# Patient Record
Sex: Female | Born: 1945
Health system: Southern US, Community
[De-identification: ages and names within clinical notes are randomized; demographics above are authoritative.]

## PROBLEM LIST (undated history)

## (undated) DIAGNOSIS — J189 Pneumonia, unspecified organism: Secondary | ICD-10-CM

## (undated) DIAGNOSIS — M419 Scoliosis, unspecified: Secondary | ICD-10-CM

## (undated) DIAGNOSIS — D649 Anemia, unspecified: Secondary | ICD-10-CM

## (undated) DIAGNOSIS — Z923 Personal history of irradiation: Secondary | ICD-10-CM

## (undated) DIAGNOSIS — Z9221 Personal history of antineoplastic chemotherapy: Secondary | ICD-10-CM

## (undated) DIAGNOSIS — I10 Essential (primary) hypertension: Secondary | ICD-10-CM

## (undated) DIAGNOSIS — R300 Dysuria: Secondary | ICD-10-CM

## (undated) DIAGNOSIS — C50919 Malignant neoplasm of unspecified site of unspecified female breast: Secondary | ICD-10-CM

## (undated) DIAGNOSIS — H269 Unspecified cataract: Secondary | ICD-10-CM

## (undated) DIAGNOSIS — J449 Chronic obstructive pulmonary disease, unspecified: Secondary | ICD-10-CM

## (undated) DIAGNOSIS — G5790 Unspecified mononeuropathy of unspecified lower limb: Secondary | ICD-10-CM

## (undated) DIAGNOSIS — R05 Cough: Secondary | ICD-10-CM

## (undated) DIAGNOSIS — I509 Heart failure, unspecified: Secondary | ICD-10-CM

## (undated) DIAGNOSIS — R634 Abnormal weight loss: Secondary | ICD-10-CM

## (undated) DIAGNOSIS — I4892 Unspecified atrial flutter: Secondary | ICD-10-CM

## (undated) DIAGNOSIS — R911 Solitary pulmonary nodule: Secondary | ICD-10-CM

## (undated) DIAGNOSIS — M8580 Other specified disorders of bone density and structure, unspecified site: Secondary | ICD-10-CM

## (undated) HISTORY — DX: Malignant neoplasm of unspecified site of unspecified female breast: C50.919

## (undated) HISTORY — DX: Anemia, unspecified: D64.9

## (undated) HISTORY — DX: Chronic obstructive pulmonary disease, unspecified: J44.9

## (undated) HISTORY — DX: Abnormal weight loss: R63.4

## (undated) HISTORY — DX: Cough: R05

## (undated) HISTORY — DX: Unspecified cataract: H26.9

## (undated) HISTORY — DX: Other specified disorders of bone density and structure, unspecified site: M85.80

## (undated) HISTORY — DX: Pneumonia, unspecified organism: J18.9

## (undated) HISTORY — DX: Heart failure, unspecified: I50.9

## (undated) HISTORY — DX: Unspecified atrial flutter: I48.92

## (undated) HISTORY — DX: Dysuria: R30.0

## (undated) HISTORY — DX: Solitary pulmonary nodule: R91.1

---

## 2009-08-13 DIAGNOSIS — I4892 Unspecified atrial flutter: Secondary | ICD-10-CM

## 2009-08-13 DIAGNOSIS — I509 Heart failure, unspecified: Secondary | ICD-10-CM

## 2009-08-13 DIAGNOSIS — J189 Pneumonia, unspecified organism: Secondary | ICD-10-CM

## 2009-08-13 HISTORY — DX: Pneumonia, unspecified organism: J18.9

## 2009-08-13 HISTORY — DX: Unspecified atrial flutter: I48.92

## 2009-08-13 HISTORY — DX: Heart failure, unspecified: I50.9

## 2010-03-06 ENCOUNTER — Encounter: Admission: RE | Admit: 2010-03-06 | Discharge: 2010-03-06 | Payer: Self-pay | Admitting: Family Medicine

## 2010-03-08 ENCOUNTER — Encounter: Admission: RE | Admit: 2010-03-08 | Discharge: 2010-03-08 | Payer: Self-pay | Admitting: Family Medicine

## 2010-03-14 ENCOUNTER — Encounter: Admission: RE | Admit: 2010-03-14 | Discharge: 2010-03-14 | Payer: Self-pay | Admitting: Family Medicine

## 2010-03-15 ENCOUNTER — Ambulatory Visit: Payer: Self-pay | Admitting: Oncology

## 2010-03-21 ENCOUNTER — Encounter: Admission: RE | Admit: 2010-03-21 | Discharge: 2010-03-21 | Payer: Self-pay | Admitting: Family Medicine

## 2010-03-22 LAB — CBC WITH DIFFERENTIAL/PLATELET
BASO%: 0.7 % (ref 0.0–2.0)
EOS%: 1.6 % (ref 0.0–7.0)
Eosinophils Absolute: 0.1 10*3/uL (ref 0.0–0.5)
MCH: 31.4 pg (ref 25.1–34.0)
MCHC: 34 g/dL (ref 31.5–36.0)
MCV: 92.4 fL (ref 79.5–101.0)
MONO%: 9.3 % (ref 0.0–14.0)
NEUT#: 4.2 10*3/uL (ref 1.5–6.5)
RBC: 3.91 10*6/uL (ref 3.70–5.45)
RDW: 13.5 % (ref 11.2–14.5)

## 2010-03-22 LAB — COMPREHENSIVE METABOLIC PANEL
ALT: 10 U/L (ref 0–35)
AST: 18 U/L (ref 0–37)
Albumin: 4 g/dL (ref 3.5–5.2)
Alkaline Phosphatase: 48 U/L (ref 39–117)
Potassium: 4.1 mEq/L (ref 3.5–5.3)
Sodium: 137 mEq/L (ref 135–145)
Total Protein: 7.3 g/dL (ref 6.0–8.3)

## 2010-03-27 ENCOUNTER — Encounter: Admission: RE | Admit: 2010-03-27 | Discharge: 2010-03-27 | Payer: Self-pay | Admitting: Oncology

## 2010-03-28 ENCOUNTER — Telehealth: Payer: Self-pay | Admitting: Pulmonary Disease

## 2010-03-28 ENCOUNTER — Ambulatory Visit (HOSPITAL_COMMUNITY): Admission: RE | Admit: 2010-03-28 | Discharge: 2010-03-28 | Payer: Self-pay | Admitting: Oncology

## 2010-03-29 ENCOUNTER — Ambulatory Visit: Payer: Self-pay | Admitting: Pulmonary Disease

## 2010-03-29 DIAGNOSIS — R599 Enlarged lymph nodes, unspecified: Secondary | ICD-10-CM | POA: Insufficient documentation

## 2010-03-29 DIAGNOSIS — J449 Chronic obstructive pulmonary disease, unspecified: Secondary | ICD-10-CM

## 2010-03-30 DIAGNOSIS — R911 Solitary pulmonary nodule: Secondary | ICD-10-CM

## 2010-04-03 ENCOUNTER — Ambulatory Visit (HOSPITAL_COMMUNITY): Admission: RE | Admit: 2010-04-03 | Discharge: 2010-04-03 | Payer: Self-pay | Admitting: Thoracic Surgery

## 2010-04-03 ENCOUNTER — Ambulatory Visit: Payer: Self-pay | Admitting: Thoracic Surgery

## 2010-04-03 ENCOUNTER — Ambulatory Visit: Payer: Self-pay | Admitting: Pulmonary Disease

## 2010-04-03 HISTORY — PX: BRONCHOSCOPY: SUR163

## 2010-04-13 ENCOUNTER — Encounter: Payer: Self-pay | Admitting: Pulmonary Disease

## 2010-04-13 DIAGNOSIS — C50919 Malignant neoplasm of unspecified site of unspecified female breast: Secondary | ICD-10-CM

## 2010-04-13 HISTORY — DX: Malignant neoplasm of unspecified site of unspecified female breast: C50.919

## 2010-04-20 ENCOUNTER — Encounter (INDEPENDENT_AMBULATORY_CARE_PROVIDER_SITE_OTHER): Payer: Self-pay | Admitting: General Surgery

## 2010-04-20 ENCOUNTER — Ambulatory Visit (HOSPITAL_COMMUNITY): Admission: RE | Admit: 2010-04-20 | Discharge: 2010-04-21 | Payer: Self-pay | Admitting: General Surgery

## 2010-04-20 ENCOUNTER — Encounter: Payer: Self-pay | Admitting: Pulmonary Disease

## 2010-04-20 HISTORY — PX: MASTECTOMY: SHX3

## 2010-05-01 ENCOUNTER — Ambulatory Visit: Payer: Self-pay | Admitting: Pulmonary Disease

## 2010-05-03 ENCOUNTER — Ambulatory Visit: Admission: RE | Admit: 2010-05-03 | Discharge: 2010-05-03 | Payer: Self-pay | Admitting: Oncology

## 2010-05-03 ENCOUNTER — Ambulatory Visit: Payer: Self-pay | Admitting: Cardiology

## 2010-05-10 ENCOUNTER — Ambulatory Visit: Payer: Self-pay | Admitting: Oncology

## 2010-05-12 ENCOUNTER — Encounter: Payer: Self-pay | Admitting: Pulmonary Disease

## 2010-05-12 LAB — COMPREHENSIVE METABOLIC PANEL
ALT: 8 U/L (ref 0–35)
Albumin: 4 g/dL (ref 3.5–5.2)
Alkaline Phosphatase: 53 U/L (ref 39–117)
Glucose, Bld: 85 mg/dL (ref 70–99)
Potassium: 4.4 mEq/L (ref 3.5–5.3)
Sodium: 134 mEq/L — ABNORMAL LOW (ref 135–145)
Total Protein: 6.7 g/dL (ref 6.0–8.3)

## 2010-05-12 LAB — CBC WITH DIFFERENTIAL/PLATELET
Eosinophils Absolute: 0.1 10*3/uL (ref 0.0–0.5)
MCV: 94.1 fL (ref 79.5–101.0)
MONO#: 0.5 10*3/uL (ref 0.1–0.9)
MONO%: 9.1 % (ref 0.0–14.0)
NEUT#: 3.8 10*3/uL (ref 1.5–6.5)
RBC: 3.84 10*6/uL (ref 3.70–5.45)
RDW: 14 % (ref 11.2–14.5)
WBC: 5.1 10*3/uL (ref 3.9–10.3)

## 2010-05-17 ENCOUNTER — Ambulatory Visit (HOSPITAL_COMMUNITY): Admission: RE | Admit: 2010-05-17 | Discharge: 2010-05-17 | Payer: Self-pay | Admitting: General Surgery

## 2010-05-17 HISTORY — PX: PORTACATH PLACEMENT: SHX2246

## 2010-05-22 ENCOUNTER — Telehealth: Payer: Self-pay | Admitting: Pulmonary Disease

## 2010-05-25 LAB — COMPREHENSIVE METABOLIC PANEL
ALT: <8 U/L (ref 0–35)
AST: 17 U/L (ref 0–37)
Albumin: 4 g/dL (ref 3.5–5.2)
Alkaline Phosphatase: 53 U/L (ref 39–117)
BUN: 9 mg/dL (ref 6–23)
CO2: 23 mEq/L (ref 19–32)
Calcium: 8.9 mg/dL (ref 8.4–10.5)
Chloride: 101 mEq/L (ref 96–112)
Creatinine, Ser: 0.49 mg/dL (ref 0.40–1.20)
Glucose, Bld: 83 mg/dL (ref 70–99)
Potassium: 4.3 mEq/L (ref 3.5–5.3)
Sodium: 134 mEq/L — ABNORMAL LOW (ref 135–145)
Total Bilirubin: 0.3 mg/dL (ref 0.3–1.2)
Total Protein: 6.7 g/dL (ref 6.0–8.3)

## 2010-05-25 LAB — CBC WITH DIFFERENTIAL/PLATELET
Basophils Absolute: 0.1 10*3/uL (ref 0.0–0.1)
Eosinophils Absolute: 0.1 10*3/uL (ref 0.0–0.5)
HCT: 38.4 % (ref 34.8–46.6)
HGB: 12.7 g/dL (ref 11.6–15.9)
NEUT#: 5.5 10*3/uL (ref 1.5–6.5)
NEUT%: 74.8 % (ref 38.4–76.8)
RDW: 13 % (ref 11.2–14.5)
lymph#: 1 10*3/uL (ref 0.9–3.3)

## 2010-06-08 LAB — CBC WITH DIFFERENTIAL/PLATELET
Basophils Absolute: 0.1 10*3/uL (ref 0.0–0.1)
Eosinophils Absolute: 0 10*3/uL (ref 0.0–0.5)
HCT: 35.1 % (ref 34.8–46.6)
HGB: 11.6 g/dL (ref 11.6–15.9)
LYMPH%: 7.4 % — ABNORMAL LOW (ref 14.0–49.7)
MCH: 30.2 pg (ref 25.1–34.0)
MCV: 91.4 fL (ref 79.5–101.0)
MONO%: 16.4 % — ABNORMAL HIGH (ref 0.0–14.0)
NEUT#: 11.2 10*3/uL — ABNORMAL HIGH (ref 1.5–6.5)
NEUT%: 75.5 % (ref 38.4–76.8)
Platelets: 215 10*3/uL (ref 145–400)

## 2010-06-08 LAB — MAGNESIUM: Magnesium: 1.9 mg/dL (ref 1.5–2.5)

## 2010-06-08 LAB — COMPREHENSIVE METABOLIC PANEL
BUN: 9 mg/dL (ref 6–23)
CO2: 22 mEq/L (ref 19–32)
Creatinine, Ser: 0.52 mg/dL (ref 0.40–1.20)
Glucose, Bld: 89 mg/dL (ref 70–99)
Sodium: 133 mEq/L — ABNORMAL LOW (ref 135–145)
Total Bilirubin: 0.2 mg/dL — ABNORMAL LOW (ref 0.3–1.2)
Total Protein: 6.3 g/dL (ref 6.0–8.3)

## 2010-06-09 ENCOUNTER — Ambulatory Visit: Payer: Self-pay | Admitting: Oncology

## 2010-06-22 LAB — COMPREHENSIVE METABOLIC PANEL
AST: 13 U/L (ref 0–37)
Albumin: 3.2 g/dL — ABNORMAL LOW (ref 3.5–5.2)
Alkaline Phosphatase: 68 U/L (ref 39–117)
BUN: 9 mg/dL (ref 6–23)
Creatinine, Ser: 0.57 mg/dL (ref 0.40–1.20)
Glucose, Bld: 91 mg/dL (ref 70–99)
Potassium: 4.1 mEq/L (ref 3.5–5.3)
Total Bilirubin: 0.3 mg/dL (ref 0.3–1.2)

## 2010-06-22 LAB — CBC WITH DIFFERENTIAL/PLATELET
Basophils Absolute: 0.1 10*3/uL (ref 0.0–0.1)
Eosinophils Absolute: 0 10*3/uL (ref 0.0–0.5)
HCT: 28.8 % — ABNORMAL LOW (ref 34.8–46.6)
HGB: 9.6 g/dL — ABNORMAL LOW (ref 11.6–15.9)
MCV: 88.9 fL (ref 79.5–101.0)
MONO%: 13.5 % (ref 0.0–14.0)
NEUT#: 14.7 10*3/uL — ABNORMAL HIGH (ref 1.5–6.5)
NEUT%: 78.9 % — ABNORMAL HIGH (ref 38.4–76.8)
RDW: 12.6 % (ref 11.2–14.5)

## 2010-07-04 ENCOUNTER — Ambulatory Visit: Payer: Self-pay | Admitting: Oncology

## 2010-07-07 ENCOUNTER — Encounter (HOSPITAL_COMMUNITY)
Admission: RE | Admit: 2010-07-07 | Discharge: 2010-08-11 | Payer: Self-pay | Source: Home / Self Care | Attending: Oncology | Admitting: Oncology

## 2010-07-07 LAB — CBC WITH DIFFERENTIAL/PLATELET
EOS%: 0.2 % (ref 0.0–7.0)
MCH: 29 pg (ref 25.1–34.0)
MCV: 86.5 fL (ref 79.5–101.0)
MONO%: 17.5 % — ABNORMAL HIGH (ref 0.0–14.0)
NEUT#: 11 10*3/uL — ABNORMAL HIGH (ref 1.5–6.5)
RBC: 2.59 10*6/uL — ABNORMAL LOW (ref 3.70–5.45)
RDW: 12.9 % (ref 11.2–14.5)
nRBC: 0 % (ref 0–0)

## 2010-07-07 LAB — MAGNESIUM: Magnesium: 1.9 mg/dL (ref 1.5–2.5)

## 2010-07-07 LAB — COMPREHENSIVE METABOLIC PANEL
ALT: <8 U/L (ref 0–35)
Alkaline Phosphatase: 72 U/L (ref 39–117)
Sodium: 135 mEq/L (ref 135–145)
Total Bilirubin: 0.3 mg/dL (ref 0.3–1.2)
Total Protein: 6.4 g/dL (ref 6.0–8.3)

## 2010-07-13 ENCOUNTER — Ambulatory Visit: Payer: Self-pay | Admitting: Cardiology

## 2010-07-13 ENCOUNTER — Inpatient Hospital Stay (HOSPITAL_COMMUNITY)
Admission: EM | Admit: 2010-07-13 | Discharge: 2010-07-20 | Payer: Self-pay | Source: Home / Self Care | Attending: Oncology | Admitting: Oncology

## 2010-07-14 ENCOUNTER — Encounter: Payer: Self-pay | Admitting: Pulmonary Disease

## 2010-07-15 ENCOUNTER — Encounter (INDEPENDENT_AMBULATORY_CARE_PROVIDER_SITE_OTHER): Payer: Self-pay | Admitting: Internal Medicine

## 2010-07-20 ENCOUNTER — Telehealth: Payer: Self-pay | Admitting: Pulmonary Disease

## 2010-07-28 ENCOUNTER — Ambulatory Visit: Payer: Self-pay | Admitting: Pulmonary Disease

## 2010-07-28 ENCOUNTER — Encounter: Payer: Self-pay | Admitting: Pulmonary Disease

## 2010-07-28 DIAGNOSIS — J189 Pneumonia, unspecified organism: Secondary | ICD-10-CM

## 2010-07-31 LAB — COMPREHENSIVE METABOLIC PANEL
AST: 18 U/L (ref 0–37)
Alkaline Phosphatase: 64 U/L (ref 39–117)
BUN: 14 mg/dL (ref 6–23)
Creatinine, Ser: 0.53 mg/dL (ref 0.40–1.20)
Glucose, Bld: 88 mg/dL (ref 70–99)

## 2010-07-31 LAB — CBC WITH DIFFERENTIAL/PLATELET
BASO%: 3.1 % — ABNORMAL HIGH (ref 0.0–2.0)
EOS%: 1.2 % (ref 0.0–7.0)
HGB: 10.1 g/dL — ABNORMAL LOW (ref 11.6–15.9)
LYMPH%: 12.7 % — ABNORMAL LOW (ref 14.0–49.7)
MCHC: 32.9 g/dL (ref 31.5–36.0)
MCV: 89.5 fL (ref 79.5–101.0)
NEUT#: 3.8 10*3/uL (ref 1.5–6.5)
NEUT%: 65 % (ref 38.4–76.8)
Platelets: 234 10*3/uL (ref 145–400)
RBC: 3.43 10*6/uL — ABNORMAL LOW (ref 3.70–5.45)
WBC: 5.8 10*3/uL (ref 3.9–10.3)
lymph#: 0.7 10*3/uL — ABNORMAL LOW (ref 0.9–3.3)

## 2010-08-03 ENCOUNTER — Ambulatory Visit: Payer: Self-pay | Admitting: Oncology

## 2010-08-11 ENCOUNTER — Encounter: Payer: Self-pay | Admitting: Adult Health

## 2010-08-11 ENCOUNTER — Ambulatory Visit: Payer: Self-pay | Admitting: Pulmonary Disease

## 2010-08-16 ENCOUNTER — Ambulatory Visit: Payer: Self-pay | Admitting: Cardiovascular Disease

## 2010-08-16 LAB — COMPREHENSIVE METABOLIC PANEL
ALT: 14 U/L (ref 0–35)
AST: 19 U/L (ref 0–37)
Albumin: 3.7 g/dL (ref 3.5–5.2)
Alkaline Phosphatase: 74 U/L (ref 39–117)
BUN: 11 mg/dL (ref 6–23)
CO2: 25 mEq/L (ref 19–32)
Calcium: 8.3 mg/dL — ABNORMAL LOW (ref 8.4–10.5)
Chloride: 99 mEq/L (ref 96–112)
Creatinine, Ser: 0.52 mg/dL (ref 0.40–1.20)
Glucose, Bld: 90 mg/dL (ref 70–99)
Potassium: 4 mEq/L (ref 3.5–5.3)
Sodium: 133 mEq/L — ABNORMAL LOW (ref 135–145)
Total Bilirubin: 0.4 mg/dL (ref 0.3–1.2)
Total Protein: 6.2 g/dL (ref 6.0–8.3)

## 2010-08-16 LAB — CBC WITH DIFFERENTIAL/PLATELET
BASO%: 0.5 % (ref 0.0–2.0)
Basophils Absolute: 0 10*3/uL (ref 0.0–0.1)
EOS%: 2.3 % (ref 0.0–7.0)
Eosinophils Absolute: 0.1 10*3/uL (ref 0.0–0.5)
HCT: 24.6 % — ABNORMAL LOW (ref 34.8–46.6)
HGB: 8.2 g/dL — ABNORMAL LOW (ref 11.6–15.9)
LYMPH%: 12.1 % — ABNORMAL LOW (ref 14.0–49.7)
MCH: 29.7 pg (ref 25.1–34.0)
MCHC: 33.3 g/dL (ref 31.5–36.0)
MCV: 89.1 fL (ref 79.5–101.0)
MONO#: 0.5 10*3/uL (ref 0.1–0.9)
MONO%: 8.7 % (ref 0.0–14.0)
NEUT#: 4.3 10*3/uL (ref 1.5–6.5)
NEUT%: 76.4 % (ref 38.4–76.8)
Platelets: 183 10*3/uL (ref 145–400)
RBC: 2.76 10*6/uL — ABNORMAL LOW (ref 3.70–5.45)
RDW: 15.6 % — ABNORMAL HIGH (ref 11.2–14.5)
WBC: 5.6 10*3/uL (ref 3.9–10.3)
lymph#: 0.7 10*3/uL — ABNORMAL LOW (ref 0.9–3.3)
nRBC: 0 % (ref 0–0)

## 2010-08-16 LAB — MAGNESIUM: Magnesium: 1.7 mg/dL (ref 1.5–2.5)

## 2010-08-17 LAB — FERRITIN: Ferritin: 847 ng/mL — ABNORMAL HIGH (ref 10–291)

## 2010-08-17 LAB — IRON AND TIBC
%SAT: 16 % — ABNORMAL LOW (ref 20–55)
Iron: 38 ug/dL — ABNORMAL LOW (ref 42–145)
TIBC: 242 ug/dL — ABNORMAL LOW (ref 250–470)
UIBC: 204 ug/dL

## 2010-08-17 LAB — VITAMIN B12: Vitamin B-12: 893 pg/mL (ref 211–911)

## 2010-08-17 LAB — FOLATE: Folate: 12 ng/mL

## 2010-08-23 ENCOUNTER — Encounter (HOSPITAL_COMMUNITY)
Admission: RE | Admit: 2010-08-23 | Discharge: 2010-09-12 | Payer: Self-pay | Source: Home / Self Care | Attending: Medical | Admitting: Medical

## 2010-08-23 LAB — CBC WITH DIFFERENTIAL/PLATELET
BASO%: 0.6 % (ref 0.0–2.0)
Basophils Absolute: 0 10*3/uL (ref 0.0–0.1)
EOS%: 3.6 % (ref 0.0–7.0)
Eosinophils Absolute: 0.1 10*3/uL (ref 0.0–0.5)
HCT: 23.1 % — ABNORMAL LOW (ref 34.8–46.6)
HGB: 7.4 g/dL — ABNORMAL LOW (ref 11.6–15.9)
LYMPH%: 19.4 % (ref 14.0–49.7)
MCH: 30 pg (ref 25.1–34.0)
MCHC: 32 g/dL (ref 31.5–36.0)
MCV: 93.5 fL (ref 79.5–101.0)
MONO#: 0.3 10*3/uL (ref 0.1–0.9)
MONO%: 8.1 % (ref 0.0–14.0)
NEUT#: 2.5 10*3/uL (ref 1.5–6.5)
NEUT%: 68.3 % (ref 38.4–76.8)
Platelets: 173 10*3/uL (ref 145–400)
RBC: 2.47 10*6/uL — ABNORMAL LOW (ref 3.70–5.45)
RDW: 16.9 % — ABNORMAL HIGH (ref 11.2–14.5)
WBC: 3.6 10*3/uL — ABNORMAL LOW (ref 3.9–10.3)
lymph#: 0.7 10*3/uL — ABNORMAL LOW (ref 0.9–3.3)
nRBC: 0 % (ref 0–0)

## 2010-08-23 LAB — COMPREHENSIVE METABOLIC PANEL
ALT: 12 U/L (ref 0–35)
AST: 20 U/L (ref 0–37)
Albumin: 3.5 g/dL (ref 3.5–5.2)
Alkaline Phosphatase: 61 U/L (ref 39–117)
BUN: 9 mg/dL (ref 6–23)
CO2: 28 mEq/L (ref 19–32)
Calcium: 8.9 mg/dL (ref 8.4–10.5)
Chloride: 102 mEq/L (ref 96–112)
Creatinine, Ser: 0.54 mg/dL (ref 0.40–1.20)
Glucose, Bld: 88 mg/dL (ref 70–99)
Potassium: 4 mEq/L (ref 3.5–5.3)
Sodium: 136 mEq/L (ref 135–145)
Total Bilirubin: 0.6 mg/dL (ref 0.3–1.2)
Total Protein: 6.1 g/dL (ref 6.0–8.3)

## 2010-08-23 LAB — MAGNESIUM: Magnesium: 1.8 mg/dL (ref 1.5–2.5)

## 2010-08-28 LAB — CROSSMATCH
ABO/RH(D): A POS
Antibody Screen: NEGATIVE
Unit division: 0
Unit division: 0

## 2010-08-31 LAB — CBC WITH DIFFERENTIAL/PLATELET
BASO%: 1 % (ref 0.0–2.0)
Basophils Absolute: 0 10*3/uL (ref 0.0–0.1)
EOS%: 1.8 % (ref 0.0–7.0)
Eosinophils Absolute: 0.1 10*3/uL (ref 0.0–0.5)
HCT: 31.2 % — ABNORMAL LOW (ref 34.8–46.6)
HGB: 10.2 g/dL — ABNORMAL LOW (ref 11.6–15.9)
LYMPH%: 15.8 % (ref 14.0–49.7)
MCH: 30.1 pg (ref 25.1–34.0)
MCHC: 32.7 g/dL (ref 31.5–36.0)
MCV: 92 fL (ref 79.5–101.0)
MONO#: 0.4 10*3/uL (ref 0.1–0.9)
MONO%: 9.8 % (ref 0.0–14.0)
NEUT#: 2.8 10*3/uL (ref 1.5–6.5)
NEUT%: 71.6 % (ref 38.4–76.8)
Platelets: 159 10*3/uL (ref 145–400)
RBC: 3.39 10*6/uL — ABNORMAL LOW (ref 3.70–5.45)
RDW: 16.4 % — ABNORMAL HIGH (ref 11.2–14.5)
WBC: 3.9 10*3/uL (ref 3.9–10.3)
lymph#: 0.6 10*3/uL — ABNORMAL LOW (ref 0.9–3.3)

## 2010-08-31 LAB — COMPREHENSIVE METABOLIC PANEL
ALT: 10 U/L (ref 0–35)
AST: 16 U/L (ref 0–37)
Albumin: 3.6 g/dL (ref 3.5–5.2)
Alkaline Phosphatase: 57 U/L (ref 39–117)
BUN: 14 mg/dL (ref 6–23)
CO2: 24 mEq/L (ref 19–32)
Calcium: 8.6 mg/dL (ref 8.4–10.5)
Chloride: 103 mEq/L (ref 96–112)
Creatinine, Ser: 0.52 mg/dL (ref 0.40–1.20)
Glucose, Bld: 84 mg/dL (ref 70–99)
Potassium: 4.1 mEq/L (ref 3.5–5.3)
Sodium: 136 mEq/L (ref 135–145)
Total Bilirubin: 0.4 mg/dL (ref 0.3–1.2)
Total Protein: 5.8 g/dL — ABNORMAL LOW (ref 6.0–8.3)

## 2010-08-31 LAB — MAGNESIUM: Magnesium: 1.7 mg/dL (ref 1.5–2.5)

## 2010-08-31 LAB — TYPE & CROSSMATCH - CHCC

## 2010-09-03 ENCOUNTER — Encounter: Payer: Self-pay | Admitting: Family Medicine

## 2010-09-05 ENCOUNTER — Ambulatory Visit (HOSPITAL_BASED_OUTPATIENT_CLINIC_OR_DEPARTMENT_OTHER): Payer: Medicare Other | Admitting: Oncology

## 2010-09-05 ENCOUNTER — Encounter: Payer: Self-pay | Admitting: Pulmonary Disease

## 2010-09-05 DIAGNOSIS — F17200 Nicotine dependence, unspecified, uncomplicated: Secondary | ICD-10-CM

## 2010-09-05 DIAGNOSIS — I509 Heart failure, unspecified: Secondary | ICD-10-CM

## 2010-09-05 DIAGNOSIS — J441 Chronic obstructive pulmonary disease with (acute) exacerbation: Secondary | ICD-10-CM

## 2010-09-05 DIAGNOSIS — I10 Essential (primary) hypertension: Secondary | ICD-10-CM

## 2010-09-05 DIAGNOSIS — Z9981 Dependence on supplemental oxygen: Secondary | ICD-10-CM

## 2010-09-07 LAB — CBC WITH DIFFERENTIAL/PLATELET
BASO%: 1.1 % (ref 0.0–2.0)
Basophils Absolute: 0 10*3/uL (ref 0.0–0.1)
EOS%: 2.2 % (ref 0.0–7.0)
Eosinophils Absolute: 0.1 10*3/uL (ref 0.0–0.5)
HCT: 29.6 % — ABNORMAL LOW (ref 34.8–46.6)
HGB: 9.7 g/dL — ABNORMAL LOW (ref 11.6–15.9)
LYMPH%: 17.7 % (ref 14.0–49.7)
MCH: 30.3 pg (ref 25.1–34.0)
MCHC: 32.8 g/dL (ref 31.5–36.0)
MCV: 92.5 fL (ref 79.5–101.0)
MONO#: 0.4 10*3/uL (ref 0.1–0.9)
MONO%: 12.2 % (ref 0.0–14.0)
NEUT#: 2.4 10*3/uL (ref 1.5–6.5)
NEUT%: 66.8 % (ref 38.4–76.8)
Platelets: 182 10*3/uL (ref 145–400)
RBC: 3.2 10*6/uL — ABNORMAL LOW (ref 3.70–5.45)
RDW: 16.3 % — ABNORMAL HIGH (ref 11.2–14.5)
WBC: 3.6 10*3/uL — ABNORMAL LOW (ref 3.9–10.3)
lymph#: 0.6 10*3/uL — ABNORMAL LOW (ref 0.9–3.3)
nRBC: 0 % (ref 0–0)

## 2010-09-07 LAB — COMPREHENSIVE METABOLIC PANEL
ALT: 14 U/L (ref 0–35)
AST: 19 U/L (ref 0–37)
Albumin: 3.1 g/dL — ABNORMAL LOW (ref 3.5–5.2)
Alkaline Phosphatase: 42 U/L (ref 39–117)
BUN: 11 mg/dL (ref 6–23)
CO2: 28 mEq/L (ref 19–32)
Calcium: 8.5 mg/dL (ref 8.4–10.5)
Chloride: 103 mEq/L (ref 96–112)
Creatinine, Ser: 0.57 mg/dL (ref 0.40–1.20)
Glucose, Bld: 131 mg/dL — ABNORMAL HIGH (ref 70–99)
Potassium: 3.6 mEq/L (ref 3.5–5.3)
Sodium: 137 mEq/L (ref 135–145)
Total Bilirubin: 0.5 mg/dL (ref 0.3–1.2)
Total Protein: 5.6 g/dL — ABNORMAL LOW (ref 6.0–8.3)

## 2010-09-07 LAB — MAGNESIUM: Magnesium: 1.6 mg/dL (ref 1.5–2.5)

## 2010-09-12 NOTE — Progress Notes (Signed)
Summary: appt questions-LMTCBx1  Phone Note Call from Patient Call back at Home Phone [phone]   Caller: Spouse//[email or web address] Call For: alva Summary of Call: States his wife received a letter to make an appt for Dec wants to know why she needs an appt if RA is not going to scan her, pls advise. Initial call taken by: Darletta Moll,  May 22, 2010 8:17 AM  Follow-up for Phone Call        Pt instructions from 05-01-10 OV "2)  Please schedule a follow-up appointment in 3 months." In IDX reminder was placed and sent. LMOMTCB. Zackery Barefoot CMA  May 22, 2010 8:53 AM   Called, spoke with pt's husband.  He states at last OV with RA they left with the impression that pt would have a scan done 3 month after breast was removed.  Breast was removed on 04/17/10.  Would like to know if this scan will be done prior to OV with RA.  If possible woud like scan and OV same day as they have a far distance to drive.  Aware RA out of the office until 10/24 and ok with this.  Dr. Vassie Loll, pls advise.  Thanks! Follow-up by: Gweneth Dimitri RN,  May 23, 2010 5:33 PM  Additional Follow-up for Phone Call Additional follow up Details #1::        Scan will be set up by cancer ctr - ok to make Fu appt after that date available.  If they do not set up ct scan, we can do that. Additional Follow-up by: Comer Locket. Vassie Loll MD,  May 23, 2010 8:44 PM    Additional Follow-up for Phone Call Additional follow up Details #2::    LMTCBx1 to advise CT to be scheduled by Cancer Center and to have OV with RA after that. Carron Curie CMA  May 24, 2010 12:31 PM  Pt spouse advised of above. Spoke with Cameo at Dr. Lodema Pilot office who advised pt will start Chemo therapy tomorrow which may go up to 4 months and routine CT are not scheduled until after then. Cameo is faxing last OV notes. Will forward to RA for review. Zackery Barefoot CMA  May 24, 2010 4:49 PM  OKa s per cancer ctr Follow-up by: Comer Locket. Vassie Loll MD,   May 25, 2010 6:07 PM

## 2010-09-12 NOTE — Progress Notes (Signed)
Summary: new pt appt  Phone Note Outgoing Call Call back at [facility] Phone [phone]   Call placed by: Zackery Barefoot CMA,  March 28, 2010 2:44 PM Summary of Call: [facility] per RA pt needs to come in 03/29/2010 double book 2:45pm slot for new consult per Dr. Gaylyn Rong @ the cancer center. If pt can not make that appointment will need to schedule pt for next Tuesday in [place]. Since I am not @ the [place] office I will place on hold in Triage. Marcell Anger will also attempt to contact pt. Zackery Barefoot CMA  March 28, 2010 2:46 PM   Follow-up for Phone Call        Received call from Marcell Anger who advised she spoke with pt's spouse and they will be able to make appt on 03-29-10. Zackery Barefoot CMA  March 28, 2010 2:48 PM

## 2010-09-12 NOTE — Letter (Signed)
Summary: [facility]  [facility], [facility]   Imported By: Sherian Rein 05/01/2010 08:58:56  _____________________________________________________________________  External Attachment:    Type:   Image     Comment:   External Document

## 2010-09-12 NOTE — Assessment & Plan Note (Signed)
Summary: follow up//jwr   Visit Type:  Follow-up Copy to:  Dr. Gaylyn Rong Primary Provider/Referring Provider:  Dr. Windle Guard ([place])  CC:  Pt here for follow up.  History of Present Illness: [name] , heavy smoker > 50 Pyrs, for evaluation of subcarinal lymphadenopathy noted on staging PET scan.  She presented with a breast mass, diagnosed asT2 N1 Mx lobular breast ca with right axillary LN involvement. PET-CT showed negative right cervical Ln, negative 5 mm & 10 mm nodules in th elung & 9x 14 mm subcarinal Ln with malignant range uptake although this showed a hint of calcification on the CT images. She gets dyspneic on walking long distances. Spirometry showed severe obstruction with FEV1 43%.  May 01, 2010 12:21 PM  EBUS neg. Underwent MRM, LNs pos --> portocath & chemo RT planned. Post op uneventful. ER , PR pos, Her-2 neu neg Taking albuterol two times a day  Pneumovax 04/03/10  Preventive Screening-Counseling & Management  Alcohol-Tobacco     Alcohol drinks/day: 4     Alcohol type: beer     Smoking Status: current     Packs/Day: 1.5     Year Started: 1961  Current Medications (verified): 1)  Aspirin 81 Mg Tabs (Aspirin) .... By Mouth Once Daily (On Hold) 2)  Calcium 600 Mg Tabs (Calcium) .... Take 1 Tablet By Mouth Once A Day 3)  Proair Hfa 108 (90 Base) Mcg/act Aers (Albuterol Sulfate) .... As Needed 4)  Vitamin D 1000 Unit Tabs (Cholecalciferol) .... Take 1 Tablet By Mouth Once A Day  Allergies (verified): 1)  ! * Pna Vaccine 2)  ! * Shellfish  Past History:  Past Medical History: Last updated: 03/29/2010 TIA Breast Cancer right   Social History: Last updated: 03/29/2010 Current smoker. 1 1/2 ppd Drinks 5 beers per day Lives with husband Children: yes  Past Surgical History: Benign breast biopsy-- 20 years ago Mastectomy-right Sept 8, 2011 Lymph node removal-right Sept 8, 2011  Review of Systems       The patient complains of dyspnea on  exertion.  The patient denies anorexia, fever, weight loss, weight gain, vision loss, decreased hearing, hoarseness, chest pain, syncope, peripheral edema, prolonged cough, headaches, hemoptysis, abdominal pain, melena, hematochezia, severe indigestion/heartburn, hematuria, muscle weakness, suspicious skin lesions, difficulty walking, depression, unusual weight change, abnormal bleeding, enlarged lymph nodes, and angioedema.    Vital Signs:  Patient profile:   65 year old female Height:      64 inches Weight:      109.25 pounds BMI:     18.82 O2 Sat:      95 % on Room air Temp:     97.5 degrees F oral Pulse rate:   61 / minute BP sitting:   154 / 74  (left arm) Cuff size:   regular  Vitals Entered By: Zackery Barefoot CMA (May 01, 2010 12:10 PM)  O2 Flow:  Room air CC: Pt here for follow up Comments Medications reviewed with patient Verified contact number and pharmacy with patient Zackery Barefoot [facility]  May 01, 2010 12:10 PM    Physical Exam  Additional Exam:  wt 109 May 01, 2010  Gen. Pleasant, thin woman, in no distress, normal affect ENT - no lesions, no post nasal drip Neck: No JVD, no thyromegaly, no carotid bruits Lungs: no use of accessory muscles, no dullness to percussion, decreased without rales or rhonchi  Cardiovascular: Rhythm regular, heart sounds  normal, no murmurs or gallops, no peripheral edema Musculoskeletal: No  deformities, no cyanosis or clubbing      Impression & Recommendations:  Problem # 1:  LYMPHADENOPATHY (ICD-785.6)  -unexplained ? lung vs breast, EBUS neg FU CT chest in nov 2011  Orders: Est. Patient Level III (16109)  Problem # 2:  PULMONARY NODULE (ICD-518.89)  FU ct chest  Orders: Est. Patient Level III (60454)  Problem # 3:  C O P D (ICD-496) Consider spiriva in future if lung function owrsens ct albuterol MDI for now  Medications Added to Medication List This Visit: 1)  Calcium 600 Mg Tabs (Calcium) ....  Take 1 tablet by mouth once a day 2)  Proair Hfa 108 (90 Base) Mcg/act Aers (Albuterol sulfate) .... As needed 3)  Vitamin D 1000 Unit Tabs (Cholecalciferol) .... Take 1 tablet by mouth once a day  Other Orders: Flu Vaccine 94yrs + (09811) Administration Flu vaccine - MCR (B1478)  Patient Instructions: 1)  Copy sent to: Dr Gaylyn Rong 2)  Please schedule a follow-up appointment in 3 months. 3)  Flu shot /  pneumovax    Flu Vaccine Consent Questions     Do you have a history of severe allergic reactions to this vaccine? no    Any prior history of allergic reactions to egg and/or gelatin? no    Do you have a sensitivity to the preservative Thimersol? no    Do you have a past history of Guillan-Barre Syndrome? no    Do you currently have an acute febrile illness? no    Have you ever had a severe reaction to latex? no    Vaccine information given and explained to patient? yes    Are you currently pregnant? no    Lot Number:[id number]   Exp Date:02/09/2010   Site Given  Left Deltoid IMdflu  Elray Buba RN  May 01, 2010 12:47 PM

## 2010-09-12 NOTE — Letter (Signed)
Summary: [facility]  [facility]   Imported By: Sherian Rein 06/05/2010 13:50:27  _____________________________________________________________________  External Attachment:    Type:   Image     Comment:   External Document

## 2010-09-12 NOTE — Progress Notes (Signed)
Summary: Further instructions  Phone Note Call from Patient Call back at Home Phone [phone]   Caller: Son Summary of Call: Patient's husband was instructed by hospital to contact Dr. Vassie Loll after patient was released from hospital.  Patient wants specific instructions about what to do, when to be seen, etc. Initial call taken by: Leonette Monarch,  July 20, 2010 2:37 PM  Follow-up for Phone Call        RA---this pt was seen by MR in the hospital and her husband was told to call and schedule a follow up with you---you dont have any dates until jan 24--please advise if you would like to see pt before this date and where to book her appt.  thanks Randell Loop CMA  July 20, 2010 2:49 PM   pl arrange FU with TP for FU CXR within 1 week & then with me - end Jan Follow-up by: Comer Locket. Vassie Loll MD,  July 20, 2010 2:51 PM  Additional Follow-up for Phone Call Additional follow up Details #1::        called and spoke with pts husband---he is aware per RA to schedule appt to see TP and then appt the end of jan to see RA---we will repeat cxr prior to ov on 12-16 and he is aware that this has been put in the computer for the pt,. Randell Loop [facility]  July 20, 2010 3:10 PM

## 2010-09-12 NOTE — Assessment & Plan Note (Signed)
Summary: abn.lymphnodes/apc   Visit Type:  Initial Consult Copy to:  Dr. Gaylyn Rong Primary Provider/Referring Provider:  Dr. Windle Guard ([place])  CC:  Pt here for pulmonary consult. Abnormal PET scan. Pt wants to discuss taking Calcium, ASA, and and Vitamin D. Pt also needs surgical clearance for right lymph node and right breast nodule removal.  History of Present Illness: 64/F , heavy smoker > 50 Pyrs, for evaluation of subcarinal lymphadenopathy noted on staging PET scan.  She presented with a breast mass, diagnosed asT2 N1 Mx lobular breast ca with right axillary LN involvement. PET-CT showed negative right cervical Ln, negative 5 mm & 10 mm nodules in th elung & 9x 14 mm subcarinal Ln with malignant range uptake although this showed a hint of calcification on the CT images. She denies cough, wheezing or recurrent bronchitis. She gets dyspneic on walking long distances. Spirometry showed severe obstruction with FEV1 43%.  Preventive Screening-Counseling & Management  Alcohol-Tobacco     Alcohol drinks/day: 4     Alcohol type: beer     Smoking Status: current     Packs/Day: 1.5     Year Started: 1961  Current Medications (verified): 1)  Aspirin 81 Mg Tabs (Aspirin) .... By Mouth Once Daily (On Hold)  Allergies (verified): 1)  ! * Pna Vaccine 2)  ! * Shellfish  Past History:  Past Medical History: TIA Breast Cancer right   Social History: Current smoker. 1 1/2 ppd Drinks 5 beers per day Lives with husband Children: yesSmoking Status:  current Packs/Day:  1.5 Alcohol drinks/day:  4  Review of Systems       The patient complains of shortness of breath with activity, non-productive cough, loss of appetite, and headaches.  The patient denies shortness of breath at rest, productive cough, coughing up blood, chest pain, irregular heartbeats, acid heartburn, indigestion, weight change, abdominal pain, difficulty swallowing, sore throat, tooth/dental problems, nasal  congestion/difficulty breathing through nose, sneezing, itching, ear ache, anxiety, depression, hand/feet swelling, joint stiffness or pain, rash, change in color of mucus, and fever.    Vital Signs:  Patient profile:   65 year old female Height:      64 inches Weight:      109.13 pounds BMI:     18.80 O2 Sat:      98 % on Room air Temp:     98.1 degrees F oral Pulse rate:   87 / minute BP sitting:   140 / 78  (right arm) Cuff size:   regular  Vitals Entered By: Zackery Barefoot CMA (March 29, 2010 2:34 PM)  O2 Flow:  Room air CC: Pt here for pulmonary consult. Abnormal PET scan. Pt wants to discuss taking Calcium, ASA, and Vitamin D. Pt also needs surgical clearance for right lymph node and right breast nodule removal Comments Medications reviewed with patient Verified contact number and pharmacy with patient Zackery Barefoot CMA  March 29, 2010 2:35 PM    Physical Exam  Additional Exam:  Gen. Pleasant, thin woman, in no distress, normal affect ENT - no lesions, no post nasal drip Neck: No JVD, no thyromegaly, no carotid bruits Lungs: no use of accessory muscles, no dullness to percussion, decreased without rales or rhonchi  Cardiovascular: Rhythm regular, heart sounds  normal, no murmurs or gallops, no peripheral edema Abdomen: soft and non-tender, no hepatosplenomegaly, BS normal. Musculoskeletal: No deformities, no cyanosis or clubbing Neuro:  alert, non focal     Pulmonary Function Test Date: 03/29/2010 3:28 PM  Gender: Female  Pre-Spirometry FVC    Value: 2.29 L/min   % Pred: 73 % FEV1    Value: 1.04 L     Pred: 2.40 L     % Pred: 43.20 % FEV1/FVC  Value: 45.27 %     % Pred: 58.80 %  Impression & Recommendations:  Problem # 1:  C O P D (ICD-496) start albuterol MDI consider spiriva in future  Problem # 2:  LYMPHADENOPATHY (ICD-785.6)  Discused possibilities incl -primary lung ca vs granulomatous disease (surprisingly PET pos) , doubt metastatic breast in  this region. Proceedw ith endobronchial [place] The risks of the procedure including coughing, bleeding and the small chance of lung puncture requiring chest tube were discussed in great detail. The benefits & alternatives including serial follow up were also discussed.  risks of general anesthesia for a pt with copd were outlined  Orders: Spirometry w/Graph (94010) Consultation Level V (04540)  Problem # 3:  PULMONARY NODULE (ICD-518.89)  5 &9 mmnodules will need serial FU in this heavy smoker  Orders: Spirometry w/Graph (94010) Consultation Level V (98119)  Medications Added to Medication List This Visit: 1)  Aspirin 81 Mg Tabs (Aspirin) .... By mouth once daily (on hold)  Patient Instructions: 1)  Copy sent to: Dr Rosemarie Beath, Edwyna Shell 2)  Spirometry now 3)  Bronchoscopy scheduled for Monday , 04/03/10 at 1030 A 4)  Albuterol MDI 2 puffs two times a day    Immunization History:  Tetanus/Td Immunization History:    Tetanus/Td:  historical (03/03/2010)  Influenza Immunization History:    Influenza:  historical (05/16/2009)  Pneumovax Immunization History:    Pneumovax:  historical (03/03/2010)    CardioPerfect Spirometry  ID: [id number] Patient: Rachel Mcbride, Rachel Mcbride DOB: 1945/10/03 Age: 65 Years Old Sex: Female Race: White Height: 64 Weight: 109.13 PPD: 1.5 Status: Confirmed Past Medical History:  TIA Recorded: 03/29/2010 3:28 PM  Parameter  Measured Predicted %Predicted FVC     2.29        3.13        73 FEV1     1.04        2.40        43.20 FEV1%   45.27        77.00        58.80 PEF    1.94        5.96        32.50   Interpretation: Severe obstruction.Mild restriction

## 2010-09-14 ENCOUNTER — Ambulatory Visit (HOSPITAL_BASED_OUTPATIENT_CLINIC_OR_DEPARTMENT_OTHER): Payer: Medicare Other | Admitting: Oncology

## 2010-09-14 ENCOUNTER — Ambulatory Visit (HOSPITAL_COMMUNITY)
Admission: RE | Admit: 2010-09-14 | Discharge: 2010-09-14 | Disposition: A | Discharge status: Home / Self Care | Payer: Medicare Other | Source: Ambulatory Visit | Attending: Oncology | Admitting: Oncology

## 2010-09-14 DIAGNOSIS — M7989 Other specified soft tissue disorders: Secondary | ICD-10-CM

## 2010-09-14 DIAGNOSIS — R609 Edema, unspecified: Secondary | ICD-10-CM

## 2010-09-14 DIAGNOSIS — C50419 Malignant neoplasm of upper-outer quadrant of unspecified female breast: Secondary | ICD-10-CM

## 2010-09-14 DIAGNOSIS — T451X5A Adverse effect of antineoplastic and immunosuppressive drugs, initial encounter: Secondary | ICD-10-CM

## 2010-09-14 DIAGNOSIS — Z5111 Encounter for antineoplastic chemotherapy: Secondary | ICD-10-CM

## 2010-09-14 LAB — COMPREHENSIVE METABOLIC PANEL
AST: 13 U/L (ref 0–37)
Alkaline Phosphatase: 47 U/L (ref 39–117)
Calcium: 8.4 mg/dL (ref 8.4–10.5)
Potassium: 4 mEq/L (ref 3.5–5.3)
Sodium: 136 mEq/L (ref 135–145)
Total Protein: 5.6 g/dL — ABNORMAL LOW (ref 6.0–8.3)

## 2010-09-14 LAB — CBC WITH DIFFERENTIAL/PLATELET
BASO%: 0.3 % (ref 0.0–2.0)
Eosinophils Absolute: 0.1 10*3/uL (ref 0.0–0.5)
HCT: 24.1 % — ABNORMAL LOW (ref 34.8–46.6)
MCHC: 33.2 g/dL (ref 31.5–36.0)
MONO#: 0.4 10*3/uL (ref 0.1–0.9)
NEUT#: 2.6 10*3/uL (ref 1.5–6.5)
NEUT%: 69.5 % (ref 38.4–76.8)
RBC: 2.62 10*6/uL — ABNORMAL LOW (ref 3.70–5.45)
WBC: 3.7 10*3/uL — ABNORMAL LOW (ref 3.9–10.3)
lymph#: 0.6 10*3/uL — ABNORMAL LOW (ref 0.9–3.3)
nRBC: 0 % (ref 0–0)

## 2010-09-14 NOTE — Miscellaneous (Signed)
Summary: Orders Update  Clinical Lists Changes  Orders: Added new Test order of T-2 View CXR (71020TC) - Signed 

## 2010-09-14 NOTE — Miscellaneous (Signed)
Summary: [facility]  [facility]   Imported By: Sherian Rein 09/07/2010 15:06:04  _____________________________________________________________________  External Attachment:    Type:   Image     Comment:   External Document

## 2010-09-14 NOTE — Assessment & Plan Note (Signed)
Summary: NP follow up - COPD / PNA   Copy to:  Dr. Gaylyn Rong Primary Yesica Kemler/Referring Raequon Catanzaro:  Dr. Windle Guard ([place])  CC:  2 weeks follow up - states breathing is doing well.  no new complaints.  History of Present Illness:  64/F , heavy smoker > 50 Pyrs, for evaluation of subcarinal lymphadenopathy noted on staging PET scan.  She presented with a breast mass, diagnosed asT2 N1 Mx lobular breast ca with right axillary LN involvement. PET-CT showed negative right cervical Ln, negative 5 mm & 10 mm nodules in th elung & 9x 14 mm subcarinal Ln with malignant range uptake although this showed a hint of calcification on the CT images. She gets dyspneic on walking long distances. Spirometry showed severe obstruction with FEV1 43%.  May 01, 2010 12:21 PM  EBUS neg. Underwent MRM, LNs pos --> portocath & chemo RT planned. Post op uneventful. ER , PR pos, Her-2 neu neg Taking albuterol two times a day  Pneumovax 04/03/10  July 28, 2010--Presents for post hospital follow up. Admitted 12/1-12/8 for RML PNA. Developed cough and fever few days prior to admission while she was undergoing chemo. Admitted started on IV abx w/ zosyn/vanc. Cx returned neg and abx narrowed to Levaquin. Tx w/ aggressive pulmoanry hygiene.  During her stay she developed a-flutter with cardilogy consult. She converted over to SR. and was started on cardizem. Echo showed EF 45-50%, mild LV reduction , BNP was elevated at 755.   Since discharge she is doing better w/ decreased dyspnea and cough. She has not smoked since discharge. She is on O2 2 l/m continuous. At rest today on room air she is 93-95%. She does have subcarinal adenopathy w/ nondiagnostic EBUS -this will need serial follow up on CT. She is planning on restarting weekly chemo next week for 12 weeks. Denies chest pain,  orthopnea, hemoptysis, fever, n/v/d, edema, headache.   August 11, 2010 --Presents for 2 weeks follow up - states breathing is doing  well.  no new complaints. Spirometry shows FEV1 at 55%, rastio at 74. Previous FEV1 03/29/10 at 43%. Has not smoked , congratulated on this. Denies chest pain,, orthopnea, hemoptysis, fever, n/v/d, edema, headache. Xray last ov showed improvment.   Medications Prior to Update: 1)  Aspirin 81 Mg Tabs (Aspirin) .... By Mouth Once Daily (On Hold) 2)  Calcium 600 Mg Tabs (Calcium) .... Take 1 Tablet By Mouth Once A Day 3)  Vitamin D 1000 Unit Tabs (Cholecalciferol) .... Take 1 Tablet By Mouth Once A Day 4)  Digoxin 0.125 Mg Tabs (Digoxin) .... Take 1 Tablet By Mouth Once A Day 5)  Lisinopril 5 Mg Tabs (Lisinopril) .... Take 1 Tablet By Mouth Once A Day 6)  Diltiazem Hcl Cr 240 Mg Xr24h-Cap (Diltiazem Hcl) .... Take 1 Tablet By Mouth Once A Day 7)  Furosemide 20 Mg Tabs (Furosemide) .... Take 1 Tablet By Mouth Two Times A Day 8)  Lidocaine-Prilocaine 2.5-2.5 % Crea (Lidocaine-Prilocaine) .... As Directed Before Treatments 9)  Proair Hfa 108 (90 Base) Mcg/act Aers (Albuterol Sulfate) .... As Needed  Current Medications (verified): 1)  Aspirin 81 Mg Tabs (Aspirin) .... By Mouth Once Daily (On Hold) 2)  Calcium 600 Mg Tabs (Calcium) .... Take 1 Tablet By Mouth Once A Day 3)  Proair Hfa 108 (90 Base) Mcg/act Aers (Albuterol Sulfate) .... As Needed 4)  Vitamin D 1000 Unit Tabs (Cholecalciferol) .... Take 1 Tablet By Mouth Once A Day 5)  Digoxin 0.125 Mg Tabs (  Digoxin) .... Take 1 Tablet By Mouth Once A Day 6)  Lisinopril 5 Mg Tabs (Lisinopril) .... Take 1 Tablet By Mouth Once A Day 7)  Diltiazem Hcl Cr 240 Mg Xr24h-Cap (Diltiazem Hcl) .... Take 1 Tablet By Mouth Once A Day 8)  Furosemide 20 Mg Tabs (Furosemide) .... Take 1 Tablet By Mouth Two Times A Day 9)  Lidocaine-Prilocaine 2.5-2.5 % Crea (Lidocaine-Prilocaine) .... As Directed Before Treatments 10)  Oxygen .... Wear 2l/min Continuously  Allergies (verified): 1)  ! * Pna Vaccine 2)  ! * Shellfish  Past History:  Past Medical History: Last  updated: 03/29/2010 TIA Breast Cancer right   Past Surgical History: Last updated: 05/01/2010 Benign breast biopsy-- 20 years ago Mastectomy-right Sept 8, 2011 Lymph node removal-right Sept 8, 2011  Family History: Last updated: 03/28/2010 COPD- 3 sisters RA- 3 sisters  Social History: Last updated: 07/28/2010 Current smoker. 1 1/2 ppd Drinks 5 beers per day Lives with husband Children: yes pt quit smoking 07/14/2010  Risk Factors: Smoking Status: quit < 6 months (07/28/2010) Packs/Day: 1.5 (07/28/2010)  Review of Systems      See HPI  Vital Signs:  Patient profile:   65 year old female Height:      64 inches Weight:      107.38 pounds BMI:     18.50 O2 Sat:      100 % on 2 L/min pulsing Temp:     97.2 degrees F oral Pulse rate:   77 / minute BP sitting:   130 / 60  (left arm) Cuff size:   regular  Vitals Entered By: Boone Master CNA/MA (August 11, 2010 10:32 AM)  O2 Flow:  2 L/min pulsing CC: 2 weeks follow up - states breathing is doing well.  no new complaints Is Patient Diabetic? No Comments Medications reviewed with patient Daytime contact number verified with patient. Boone Master CNA/MA  August 11, 2010 10:32 AM    Physical Exam  Additional Exam:  wt 109 May 01, 2010 >103 July 28, 2010 >107 08/11/10 Gen. Pleasant, thin woman, in no distress, normal affect ENT - no lesions, no post nasal drip Neck: No JVD, no thyromegaly, no carotid bruits Lungs:coarse bs w/ no wheezing or crackles  Cardiovascular: Rhythm regular, heart sounds  normal, no murmurs or gallops, no peripheral edema Musculoskeletal: No deformities, no cyanosis or clubbing      Impression & Recommendations:  Problem # 1:  PNEUMONIA, RIGHT MIDDLE LOBE (ICD-486)  Recent COPD flare  w/ PNA --recent  hospitalization now improving.  Plan:  Continue on same regimen.  WE ARE VERY PROUD OF YOU FOR NOT SMOKING.  Continue on same meds.  follow up Dr. Vassie Loll in 6 weeks    Wear Oxygen with activity and at bedtime  Please contact office for sooner follow up if symptoms do not improve or worsen   Orders: Est. Patient Level III (65784)  Problem # 2:  C O P D (ICD-496) Spirometry today shows slight improvent in FEV1 .  congratulated on smoking cesstation   Medications Added to Medication List This Visit: 1)  Oxygen  .... Wear 2l/min continuously  Complete Medication List: 1)  Aspirin 81 Mg Tabs (Aspirin) .... By mouth once daily (on hold) 2)  Calcium 600 Mg Tabs (Calcium) .... Take 1 tablet by mouth once a day 3)  Vitamin D 1000 Unit Tabs (Cholecalciferol) .... Take 1 tablet by mouth once a day 4)  Digoxin 0.125 Mg Tabs (Digoxin) .... Take 1 tablet  by mouth once a day 5)  Lisinopril 5 Mg Tabs (Lisinopril) .... Take 1 tablet by mouth once a day 6)  Diltiazem Hcl Cr 240 Mg Xr24h-cap (Diltiazem hcl) .... Take 1 tablet by mouth once a day 7)  Furosemide 20 Mg Tabs (Furosemide) .... Take 1 tablet by mouth two times a day 8)  Lidocaine-prilocaine 2.5-2.5 % Crea (Lidocaine-prilocaine) .... As directed before treatments 9)  Oxygen  .... Wear 2l/min continuously 10)  Proair Hfa 108 (90 Base) Mcg/act Aers (Albuterol sulfate) .... As needed  Patient Instructions: 1)  Continue on same regimen.  2)  WE ARE VERY PROUD OF YOU FOR NOT SMOKING.  3)  Continue on same meds.  4)  follow up Dr. Vassie Loll in 6 weeks  5)  Wear Oxygen with activity and at bedtime  6)  Please contact office for sooner follow up if symptoms do not improve or worsen

## 2010-09-14 NOTE — Assessment & Plan Note (Signed)
Summary: hfu/cxr prior to ov per RA/la   Visit Type:  Hospital Follow-up Copy to:  Dr. Gaylyn Rong Primary Provider/Referring Provider:  Dr. Windle Guard ([place])  CC:  No new complaints.  History of Present Illness:  64/F , heavy smoker > 50 Pyrs, for evaluation of subcarinal lymphadenopathy noted on staging PET scan.  She presented with a breast mass, diagnosed asT2 N1 Mx lobular breast ca with right axillary LN involvement. PET-CT showed negative right cervical Ln, negative 5 mm & 10 mm nodules in th elung & 9x 14 mm subcarinal Ln with malignant range uptake although this showed a hint of calcification on the CT images. She gets dyspneic on walking long distances. Spirometry showed severe obstruction with FEV1 43%.  May 01, 2010 12:21 PM  EBUS neg. Underwent MRM, LNs pos --> portocath & chemo RT planned. Post op uneventful. ER , PR pos, Her-2 neu neg Taking albuterol two times a day  Pneumovax 04/03/10  July 28, 2010--Presents for post hospital follow up. Admitted 12/1-12/8 for RML PNA. Developed cough and fever few days prior to admission while she was undergoing chemo. Admitted started on IV abx w/ zosyn/vanc. Cx returned neg and abx narrowed to Levaquin. Tx w/ aggressive pulmoanry hygiene.  During her stay she developed a-flutter with cardilogy consult. She converted over to SR. and was started on cardizem. Echo showed EF 45-50%, mild LV reduction , BNP was elevated at 755.   Since discharge she is doing better w/ decreased dyspnea and cough. She has not smoked since discharge. She is on O2 2 l/m continuous. At rest today on room air she is 93-95%. She does have subcarinal adenopathy w/ nondiagnostic EBUS -this will need serial follow up on CT. She is planning on restarting weekly chemo next week for 12 weeks. Denies chest pain,  orthopnea, hemoptysis, fever, n/v/d, edema, headache.    Preventive Screening-Counseling & Management  Alcohol-Tobacco     Alcohol drinks/day: 4     Alcohol type: beer     Smoking Status: quit < 6 months     Packs/Day: 1.5     Year Started: 1961     Year Quit: 07/2010  Current Medications (verified): 1)  Aspirin 81 Mg Tabs (Aspirin) .... By Mouth Once Daily (On Hold) 2)  Calcium 600 Mg Tabs (Calcium) .... Take 1 Tablet By Mouth Once A Day 3)  Proair Hfa 108 (90 Base) Mcg/act Aers (Albuterol Sulfate) .... As Needed 4)  Vitamin D 1000 Unit Tabs (Cholecalciferol) .... Take 1 Tablet By Mouth Once A Day 5)  Digoxin 0.125 Mg Tabs (Digoxin) .... Take 1 Tablet By Mouth Once A Day 6)  Lisinopril 5 Mg Tabs (Lisinopril) .... Take 1 Tablet By Mouth Once A Day 7)  Diltiazem Hcl Cr 240 Mg Xr24h-Cap (Diltiazem Hcl) .... Take 1 Tablet By Mouth Once A Day 8)  Furosemide 20 Mg Tabs (Furosemide) .... Take 1 Tablet By Mouth Two Times A Day 9)  Lidocaine-Prilocaine 2.5-2.5 % Crea (Lidocaine-Prilocaine) .... As Directed Before Treatments  Allergies (verified): 1)  ! * Pna Vaccine 2)  ! * Shellfish  Past History:  Past Medical History: Last updated: 03/29/2010 TIA Breast Cancer right   Past Surgical History: Last updated: 05/01/2010 Benign breast biopsy-- 20 years ago Mastectomy-right Sept 8, 2011 Lymph node removal-right Sept 8, 2011  Family History: Last updated: 03/28/2010 COPD- 3 sisters RA- 3 sisters  Social History: Last updated: 07/28/2010 Current smoker. 1 1/2 ppd Drinks 5 beers per day Lives with husband  Children: yes pt quit smoking 07/14/2010  Risk Factors: Smoking Status: quit < 6 months (07/28/2010) Packs/Day: 1.5 (07/28/2010)  Social History: Current smoker. 1 1/2 ppd Drinks 5 beers per day Lives with husband Children: yes pt quit smoking 12/2/2011Smoking Status:  quit < 6 months  Review of Systems      See HPI  Vital Signs:  Patient profile:   65 year old female Height:      64 inches Weight:      103.4 pounds BMI:     17.81 O2 Sat:      95 % on Room air Temp:     97.8 degrees F oral Pulse rate:   74  / minute BP sitting:   108 / 54  (left arm) Cuff size:   regular  Vitals Entered By: Zackery Barefoot CMA (July 28, 2010 10:16 AM)  O2 Flow:  Room air CC: No new complaints Comments Medications reviewed with patient Verified contact number and pharmacy with patient Zackery Barefoot [facility]  July 28, 2010 10:17 AM    Physical Exam  Additional Exam:  wt 109 May 01, 2010 >103 July 28, 2010  Gen. Ple[place], thin woman, in no distress, normal affect ENT - no lesions, no post nasal drip Neck: No JVD, no thyromegaly, no carotid bruits Lungs:coarse bs w/ no wheezing or crackles  Cardiovascular: Rhythm regular, heart sounds  normal, no murmurs or gallops, no peripheral edema Musculoskeletal: No deformities, no cyanosis or clubbing      Impression & Recommendations:  Problem # 1:  C O P D (ICD-496) Recent exacerbation secondary to PNA improving.  she is now off cigs will repeat spirometry on return, may need sybicort/advair   Problem # 2:  PNEUMONIA, RIGHT MIDDLE LOBE (ICD-486)  recent admission now w/ full course of abx finished.  cxr improved today.  cont w/ as needed mucuinex   Orders: Est. Patient Level IV (16109)  Problem # 3:  MALIGNANT NEOPLASM OF BREAST UNSPECIFIED SITE (ICD-174.9)  per oncology with chemo to resume next week.   Orders: Est. Patient Level IV (60454)  Problem # 4:  LYMPHADENOPATHY (ICD-785.6)  will need CT follow up   Orders: Est. Patient Level IV (09811)  Medications Added to Medication List This Visit: 1)  Digoxin 0.125 Mg Tabs (Digoxin) .... Take 1 tablet by mouth once a day 2)  Lisinopril 5 Mg Tabs (Lisinopril) .... Take 1 tablet by mouth once a day 3)  Diltiazem Hcl Cr 240 Mg Xr24h-cap (Diltiazem hcl) .... Take 1 tablet by mouth once a day 4)  Furosemide 20 Mg Tabs (Furosemide) .... Take 1 tablet by mouth two times a day 5)  Lidocaine-prilocaine 2.5-2.5 % Crea (Lidocaine-prilocaine) .... As directed before  treatments  Complete Medication List: 1)  Aspirin 81 Mg Tabs (Aspirin) .... By mouth once daily (on hold) 2)  Calcium 600 Mg Tabs (Calcium) .... Take 1 tablet by mouth once a day 3)  Proair Hfa 108 (90 Base) Mcg/act Aers (Albuterol sulfate) .... As needed 4)  Vitamin D 1000 Unit Tabs (Cholecalciferol) .... Take 1 tablet by mouth once a day 5)  Digoxin 0.125 Mg Tabs (Digoxin) .... Take 1 tablet by mouth once a day 6)  Lisinopril 5 Mg Tabs (Lisinopril) .... Take 1 tablet by mouth once a day 7)  Diltiazem Hcl Cr 240 Mg Xr24h-cap (Diltiazem hcl) .... Take 1 tablet by mouth once a day 8)  Furosemide 20 Mg Tabs (Furosemide) .... Take 1 tablet by mouth two times a  day 9)  Lidocaine-prilocaine 2.5-2.5 % Crea (Lidocaine-prilocaine) .... As directed before treatments  Patient Instructions: 1)  WE ARE VERY PROUD OF YOU FOR NOT SMOKING.  2)  Continue on same meds.  3)  follow up Dr. Hilton Cork in 2-3 weeks with spirometry  4)  Wear Oxygen with activity and at bedtime  5)  Please contact office for sooner follow up if symptoms do not improve or worsen

## 2010-09-21 ENCOUNTER — Other Ambulatory Visit: Payer: Self-pay | Admitting: Oncology

## 2010-09-21 ENCOUNTER — Encounter (HOSPITAL_BASED_OUTPATIENT_CLINIC_OR_DEPARTMENT_OTHER): Payer: Medicare Other | Admitting: Oncology

## 2010-09-21 DIAGNOSIS — T451X5A Adverse effect of antineoplastic and immunosuppressive drugs, initial encounter: Secondary | ICD-10-CM

## 2010-09-21 DIAGNOSIS — Z5111 Encounter for antineoplastic chemotherapy: Secondary | ICD-10-CM

## 2010-09-21 DIAGNOSIS — D649 Anemia, unspecified: Secondary | ICD-10-CM

## 2010-09-21 DIAGNOSIS — C50419 Malignant neoplasm of upper-outer quadrant of unspecified female breast: Secondary | ICD-10-CM

## 2010-09-21 DIAGNOSIS — R599 Enlarged lymph nodes, unspecified: Secondary | ICD-10-CM

## 2010-09-21 DIAGNOSIS — G589 Mononeuropathy, unspecified: Secondary | ICD-10-CM

## 2010-09-21 LAB — CBC WITH DIFFERENTIAL/PLATELET
BASO%: 0.2 % (ref 0.0–2.0)
Basophils Absolute: 0 10*3/uL (ref 0.0–0.1)
Eosinophils Absolute: 0 10*3/uL (ref 0.0–0.5)
HCT: 26.7 % — ABNORMAL LOW (ref 34.8–46.6)
HGB: 8.7 g/dL — ABNORMAL LOW (ref 11.6–15.9)
LYMPH%: 5.3 % — ABNORMAL LOW (ref 14.0–49.7)
MONO#: 0.6 10*3/uL (ref 0.1–0.9)
NEUT#: 4.9 10*3/uL (ref 1.5–6.5)
NEUT%: 83.5 % — ABNORMAL HIGH (ref 38.4–76.8)
Platelets: 246 10*3/uL (ref 145–400)
WBC: 5.9 10*3/uL (ref 3.9–10.3)
lymph#: 0.3 10*3/uL — ABNORMAL LOW (ref 0.9–3.3)

## 2010-09-21 LAB — COMPREHENSIVE METABOLIC PANEL
Albumin: 3.7 g/dL (ref 3.5–5.2)
Alkaline Phosphatase: 39 U/L (ref 39–117)
BUN: 15 mg/dL (ref 6–23)
Calcium: 8.9 mg/dL (ref 8.4–10.5)
Chloride: 100 mEq/L (ref 96–112)
Glucose, Bld: 109 mg/dL — ABNORMAL HIGH (ref 70–99)
Potassium: 4 mEq/L (ref 3.5–5.3)
Sodium: 136 mEq/L (ref 135–145)
Total Protein: 5.9 g/dL — ABNORMAL LOW (ref 6.0–8.3)

## 2010-09-22 ENCOUNTER — Encounter: Payer: Self-pay | Admitting: Pulmonary Disease

## 2010-09-22 ENCOUNTER — Ambulatory Visit (INDEPENDENT_AMBULATORY_CARE_PROVIDER_SITE_OTHER): Payer: Medicare Other | Admitting: Pulmonary Disease

## 2010-09-22 DIAGNOSIS — R599 Enlarged lymph nodes, unspecified: Secondary | ICD-10-CM

## 2010-09-22 DIAGNOSIS — J449 Chronic obstructive pulmonary disease, unspecified: Secondary | ICD-10-CM

## 2010-09-22 DIAGNOSIS — J4489 Other specified chronic obstructive pulmonary disease: Secondary | ICD-10-CM

## 2010-09-22 DIAGNOSIS — J984 Other disorders of lung: Secondary | ICD-10-CM

## 2010-09-22 DIAGNOSIS — J189 Pneumonia, unspecified organism: Secondary | ICD-10-CM

## 2010-09-28 ENCOUNTER — Encounter (HOSPITAL_BASED_OUTPATIENT_CLINIC_OR_DEPARTMENT_OTHER): Payer: Medicare Other | Admitting: Oncology

## 2010-09-28 ENCOUNTER — Other Ambulatory Visit: Payer: Self-pay | Admitting: Oncology

## 2010-09-28 DIAGNOSIS — C50419 Malignant neoplasm of upper-outer quadrant of unspecified female breast: Secondary | ICD-10-CM

## 2010-09-28 DIAGNOSIS — D649 Anemia, unspecified: Secondary | ICD-10-CM

## 2010-09-28 DIAGNOSIS — R599 Enlarged lymph nodes, unspecified: Secondary | ICD-10-CM

## 2010-09-28 LAB — CBC WITH DIFFERENTIAL/PLATELET
EOS%: 0.2 % (ref 0.0–7.0)
MCH: 31.3 pg (ref 25.1–34.0)
MCV: 94.5 fL (ref 79.5–101.0)
MONO%: 3.5 % (ref 0.0–14.0)
RBC: 2.91 10*6/uL — ABNORMAL LOW (ref 3.70–5.45)
RDW: 18.3 % — ABNORMAL HIGH (ref 11.2–14.5)

## 2010-09-28 NOTE — Assessment & Plan Note (Signed)
Summary: 6 week rov//sh   Visit Type:  Follow-up Copy to:  Dr. Gaylyn Rong Primary Provider/Referring Provider:  Dr. Windle Guard ([place])  CC:  Pt states breathing is better. No new complaints.  History of Present Illness:  [name] , heavy smoker > 50 Pyrs, for FU of COPD & subcarinal lymphadenopathy noted on staging PET scan.  She presented with a breast mass, diagnosed asT2 N1 Mx lobular breast ca with right axillary LN involvement. PET-CT showed negative right cervical Ln, negative 5 mm & 10 mm nodules in th elung & 9x 14 mm subcarinal Ln with malignant range uptake although this showed a hint of calcification on the CT images - EBUS neg.FU CT dec'11 >> no nodules, LN unchanged Underwent MRM, LNs pos -->ER , PR pos, Her-2 neu neg Pneumovax 04/03/10   Admitted 12/1-12/8 for RML PNA.  During her stay she developed a-flutter with cardilogy consult ([place]).  Echo showed EF 45-50%, mild LV reduction , BNP was elevated at 755.   August 11, 2010  Spirometry shows FEV1 at 55%, rastio at 21. Previous FEV1 03/29/10 at 43%.   September 22, 2010 1:48 PM  on pulse O2, she has not smoked since discharge, Dys[nea stable- has not needed mDI much. CT scan reviewed. Denies chest pain,, orthopnea, hemoptysis, fever, n/v/d, edema, headache. Xray last ov showed improvment.     Preventive Screening-Counseling & Management  Alcohol-Tobacco     Smoking Status: quit     Packs/Day: 2.0     Year Started: 1961     Year Quit: 07/14/2010  Current Medications (verified): 1)  Aspirin 81 Mg Tabs (Aspirin) .... By Mouth Once Daily (On Hold) 2)  Calcium 600 Mg Tabs (Calcium) .... Take 1 Tablet By Mouth Once A Day 3)  Vitamin D 1000 Unit Tabs (Cholecalciferol) .... Take 1 Tablet By Mouth Once A Day 4)  Digoxin 0.125 Mg Tabs (Digoxin) .... Take 1 Tablet By Mouth Once A Day 5)  Lisinopril 5 Mg Tabs (Lisinopril) .... Take 1 Tablet By Mouth Once A Day 6)  Diltiazem Hcl Cr 240 Mg Xr24h-Cap (Diltiazem Hcl) .... Take 1  Tablet By Mouth Once A Day 7)  Furosemide 20 Mg Tabs (Furosemide) .... Take 1 Tablet By Mouth Two Times A Day 8)  Lidocaine-Prilocaine 2.5-2.5 % Crea (Lidocaine-Prilocaine) .... As Directed Before Treatments 9)  Oxygen .... Wear 2l/min Continuously 10)  Proair Hfa 108 (90 Base) Mcg/act Aers (Albuterol Sulfate) .... As Needed 11)  Prednisone 10 Mg Tabs (Prednisone) .... Taper As Directed  Allergies (verified): 1)  ! * Pna Vaccine 2)  ! * Shellfish  Past History:  Past Medical History: Last updated: 03/29/2010 TIA Breast Cancer right   Social History: Last updated: 07/28/2010 Current smoker. 1 1/2 ppd Drinks 5 beers per day Lives with husband Children: yes pt quit smoking 07/14/2010  Social History: Smoking Status:  quit Packs/Day:  2.0  Review of Systems       The patient complains of dyspnea on exertion.  The patient denies anorexia, fever, weight loss, weight gain, vision loss, decreased hearing, hoarseness, chest pain, syncope, peripheral edema, prolonged cough, headaches, hemoptysis, abdominal pain, melena, hematochezia, severe indigestion/heartburn, muscle weakness, transient blindness, difficulty walking, depression, unusual weight change, abnormal bleeding, enlarged lymph nodes, and angioedema.    Vital Signs:  Patient profile:   65 year old female Height:      64 inches Weight:      110.4 pounds BMI:     19.02 O2 Sat:  100 % on Room air Temp:     97.9 degrees F oral Pulse rate:   62 / minute BP sitting:   130 / 60  (left arm) Cuff size:   regular  Vitals Entered By: Zackery Barefoot CMA (September 22, 2010 66:29 PM)  O2 Flow:  Room air  Serial Vital Signs/Assessments:  Comments: Ambulatory Pulse Oximetry  Resting; HR__75___    02 Sat__98%RA___  Lap1 (185 feet)   HR__98___   02 Sat__98%RA___ Lap2 (185 feet)   HR__109___   02 Sat__97%RA___    Lap3 (185 feet)   HR__115___   02 Sat__100%RA___  _x__Test Completed without Difficulty Zackery Barefoot CMA   September 22, 2010 2:16 PM  ___Test Stopped due to:   By: Zackery Barefoot CMA   CC: Pt states breathing is better. No new complaints Comments Medications reviewed with patient Verified contact number and pharmacy with patient Zackery Barefoot [facility]  September 22, 2010 66:29 PM    Physical Exam  Additional Exam:  wt 109 May 01, 2010 >110 September 22, 2010  Gen. Pleasant, thin woman, in no distress, normal affect ENT - no lesions, no post nasal drip Neck: No JVD, no thyromegaly, no carotid bruits Lungs:coarse bs w/ no wheezing or crackles  Cardiovascular: Rhythm regular, heart sounds  normal, no murmurs or gallops, no peripheral edema Musculoskeletal: No deformities, no cyanosis or clubbing      Impression & Recommendations:  Problem # 1:  PNEUMONIA, RIGHT MIDDLE LOBE (ICD-486)  resolved on last CXR improved on CT  Orders: Est. Patient Level IV (40981)  Problem # 2:  PULMONARY NODULE (ICD-518.89)  resolved on CT  Orders: Est. Patient Level IV (19147)  Problem # 3:  LYMPHADENOPATHY (ICD-785.6)  stable on CT , likely  benign given calcification  Orders: Est. Patient Level IV (82956)  Problem # 4:  C O P D (ICD-496) ct on albuterol MDI as needed  Does not want to satrt LABA yet Congratulated on cessation  Medications Added to Medication List This Visit: 1)  Prednisone 10 Mg Tabs (Prednisone) .... Taper as directed  Patient Instructions: 1)  Copy sent to: Dr Gaylyn Rong, Dr Jeannetta Nap 2)  Please schedule a follow-up appointment in 4 months. 3)  Oxygen evaluation - if good, try to saty off oxygen during sleep & report back in 2 weeks

## 2010-10-05 ENCOUNTER — Ambulatory Visit: Payer: Medicare Other | Attending: Radiation Oncology | Admitting: Radiation Oncology

## 2010-10-05 DIAGNOSIS — Z9010 Acquired absence of unspecified breast and nipple: Secondary | ICD-10-CM | POA: Insufficient documentation

## 2010-10-05 DIAGNOSIS — Z79899 Other long term (current) drug therapy: Secondary | ICD-10-CM | POA: Insufficient documentation

## 2010-10-05 DIAGNOSIS — C773 Secondary and unspecified malignant neoplasm of axilla and upper limb lymph nodes: Secondary | ICD-10-CM | POA: Insufficient documentation

## 2010-10-05 DIAGNOSIS — Z510 Encounter for antineoplastic radiation therapy: Secondary | ICD-10-CM | POA: Insufficient documentation

## 2010-10-05 DIAGNOSIS — Z8673 Personal history of transient ischemic attack (TIA), and cerebral infarction without residual deficits: Secondary | ICD-10-CM | POA: Insufficient documentation

## 2010-10-05 DIAGNOSIS — Z170 Estrogen receptor positive status [ER+]: Secondary | ICD-10-CM | POA: Insufficient documentation

## 2010-10-05 DIAGNOSIS — F17200 Nicotine dependence, unspecified, uncomplicated: Secondary | ICD-10-CM | POA: Insufficient documentation

## 2010-10-05 DIAGNOSIS — L299 Pruritus, unspecified: Secondary | ICD-10-CM | POA: Insufficient documentation

## 2010-10-05 DIAGNOSIS — C50919 Malignant neoplasm of unspecified site of unspecified female breast: Secondary | ICD-10-CM | POA: Insufficient documentation

## 2010-10-05 DIAGNOSIS — R5381 Other malaise: Secondary | ICD-10-CM | POA: Insufficient documentation

## 2010-10-06 ENCOUNTER — Telehealth (INDEPENDENT_AMBULATORY_CARE_PROVIDER_SITE_OTHER): Payer: Self-pay | Admitting: *Deleted

## 2010-10-11 NOTE — Consult Note (Signed)
NAMEMarland Kitchen  SHARIA, AVERITT NO.:  [id number]  MEDICAL RECORD NO.:  [id number]          PATIENT TYPE:  INP  LOCATION:  1420                         FACILITY:  [facility]  PHYSICIAN:  Kalman Shan, MD   DATE OF BIRTH:  07/14/1946  DATE OF CONSULTATION: DATE OF DISCHARGE:                                CONSULTATION   PRIMARY CARE PHYSICIAN:  Windle Guard, MD  PRIMARY ONCOLOGIST:  Jethro Bolus, MD  PULMONOLOGIST:  Oretha Milch, MD  REASON FOR CONSULTATION:  Possible right middle lobe collapse/pneumonia in the setting of neutropenic fever.  HISTORY OF PRESENT ILLNESS:  Rachel Mcbride is a 65 year old female with a recent history of carcinoma of the breast status post right mastectomy, has been receiving chemo every other week for the last 6 weeks.  She presented to the hospital on July 14, 2010 with fever and chills, fever as high as 100 degrees Fahrenheit and also cough without any sputum.  There is no associated chest pain, dyspnea, vomiting, nausea, abdominal pain, dysuria, diarrhea, headache, visual symptoms, or focal deficit.  She was found to have neutropenia on arrival in the emergency room.  She was also initially hypotensive, but this responded to fluids.  Chest x-ray showed a possible right middle lobe collapse. This resulted in a CT scan of the chest that actually shows new right middle lobe infiltrates compared August 2011 with subsegmental atelectasis.  Pulmonary Critical Care has been called for consultation.  PAST MEDICAL HISTORY: 1. T2, N1, M0 breast cancer status post right subclavian Port-A-Cath     insertion May 18, 2010, status post right modified radical     mastectomy by Dr. Emelia Loron April 21, 2010. 2. COPD.  FEV-1 43%,  being followed by Dr. Cyril Mourning on albuterol     p.r.n. with plans for Spiriva on followup. 3. PET CT March 28, 2010 showing subcarinal and left hilar lymph     nodes which were hypermetabolic.  She is status  post endobronchial     ultrasound-guided biopsy of these lymph nodes on April 03, 2010     that were nondiagnostic.  This has been placed on followup.  MEDICATIONS ON ADMISSION:  Doxorubicin, docetaxel, paclitaxel chemotherapy, albuterol p.r.n., Ativan p.r.n., prochlorperazine, aspirin, cholecalciferol, calcium carbonate.  ALLERGIES:  SELFISH.  FAMILY HISTORY:  Noncontributory.  SOCIAL HISTORY:  Smokes cigarettes, used to drink alcohol very often but quit 6 weeks ago.  Denies drug abuse.  She is reportedly a DNR.  REVIEW OF SYSTEMS:  This is as per history of present illness and past history; otherwise, 13 point review of systems is negative.  PHYSICAL EXAMINATION:  VITAL SIGNS:  Temperature 98.2, pulse 88, respiratory rate 16, blood pressure 112/63, saturation 96% on room air. GENERAL:  Alopecic female seated in the bed, very comfortable, no obvious distress. CENTRAL NERVOUS SYSTEM:  Alert and oriented x3.  Speech is normal. Glasgow coma scale 15.  Moves all 4 extremities. HEENT:  Anicteric.  No pallor.  No discharge from ears.  Eyes, nose, and mouth; no mouth sores. CHEST:  Bilateral equal air entry.  Expiration is prolonged.  Occasional scattered wheeze  present.  Of note, she is not on any bronchodilators currently.  No crackles. CARDIOVASCULAR:  S1 and S2 heard.  Regular rate and rhythm.  No murmurs. No gallops. ABDOMEN:  Soft and nontender.  No organomegaly.  No rebound.  No tenderness.  Bowel sounds present. EXTREMITIES:  No cyanosis, no clubbing or edema. SKIN:  Intact.  LABORATORY DATA:  Chest x-ray, possible collapse of the right middle lobe, obvious COPD present.  CT scan of the chest shows 1. Extensive COPD. 2. Unchanged subcarinal lymphadenopathy but new findings of right     middle lobe infiltrates with subsegmental atelectasis that is new     since PET CT scan of August 2011.  Other key laboratory show white count less than 0.4, hemoglobin 6.4, platelet  count 82,000.  Procalcitonin 0.47 on December 1.  BMET shows creatinine of 0.52.  Lactic acid 1.7.  Stool occult blood is negative.  ASSESSMENT AND PLAN: 1. Status post chemotherapy a week ago for breast cancer. 2. Current neutropenic fever with slightly elevated procalcitonin. 3. Anemia secondary to chemotherapy. 4. New right middle lobe infiltrate with subsegmental atelectasis     consistent with pneumonia as a source of neutropenic fever. 5. COPD without exacerbation but currently with mild wheeze.  PLAN: 1. I agree with Pan cultures. 2. Agree with vancomycin and Zosyn antibiotics. 3. Start aggressive bronchodilation to contract the wheeze. 4. Advised IV steroids, but the patient was very reluctant and Dr.     Jethro Bolus the oncologist wants to observe without steroids at this     stage; therefore, we have deferred this; but if wheezing continues     to get worse despite bronchodilation then we will start systemic     steroids. 5. Transfusion for hemoglobin less than 7 gm only unless the patient     is actively bleeding.  This will be done by [facility] hospitalist. 6. Start IV azithromycin for atypical pneumonia coverage. 7. Check urine Legionella and urine streptococcal antigen. 8. For the mediastinal nodes, this is being followed up by Dr. Cyril Mourning as an outpatient pulmonary critical care.  We will continue to     follow the patient during the hospitalization.     Kalman Shan, MD     MR/MEDQ  D:  07/14/2010  T:  07/14/2010  Job:  [id number]  cc:   Windle Guard, M.D. Fax: [phone]  Electronically Signed by Kalman Shan MD on 10/11/2010 10:59:03 AM

## 2010-10-15 ENCOUNTER — Emergency Department (HOSPITAL_COMMUNITY)
Admission: EM | Admit: 2010-10-15 | Discharge: 2010-10-15 | Disposition: A | Discharge status: Home / Self Care | Payer: Medicare Other | Attending: Emergency Medicine | Admitting: Emergency Medicine

## 2010-10-15 ENCOUNTER — Emergency Department (HOSPITAL_COMMUNITY): Payer: Medicare Other

## 2010-10-15 DIAGNOSIS — C50919 Malignant neoplasm of unspecified site of unspecified female breast: Secondary | ICD-10-CM | POA: Insufficient documentation

## 2010-10-15 DIAGNOSIS — J189 Pneumonia, unspecified organism: Secondary | ICD-10-CM | POA: Insufficient documentation

## 2010-10-15 DIAGNOSIS — Z79899 Other long term (current) drug therapy: Secondary | ICD-10-CM | POA: Insufficient documentation

## 2010-10-15 DIAGNOSIS — R509 Fever, unspecified: Secondary | ICD-10-CM | POA: Insufficient documentation

## 2010-10-15 LAB — URINALYSIS, ROUTINE W REFLEX MICROSCOPIC
Glucose, UA: NEGATIVE mg/dL
Hgb urine dipstick: NEGATIVE
Specific Gravity, Urine: 1.011 (ref 1.005–1.030)

## 2010-10-15 LAB — CBC
Hemoglobin: 8.2 g/dL — ABNORMAL LOW (ref 12.0–15.0)
MCH: 30.8 pg (ref 26.0–34.0)
MCHC: 31.2 g/dL (ref 30.0–36.0)
MCV: 98.9 fL (ref 78.0–100.0)
RBC: 2.66 MIL/uL — ABNORMAL LOW (ref 3.87–5.11)

## 2010-10-15 LAB — DIFFERENTIAL
Eosinophils Absolute: 0.1 10*3/uL (ref 0.0–0.7)
Lymphs Abs: 0.3 10*3/uL — ABNORMAL LOW (ref 0.7–4.0)
Monocytes Absolute: 0.5 10*3/uL (ref 0.1–1.0)
Monocytes Relative: 11 % (ref 3–12)
Neutro Abs: 3.4 10*3/uL (ref 1.7–7.7)
Neutrophils Relative %: 80 % — ABNORMAL HIGH (ref 43–77)

## 2010-10-15 LAB — COMPREHENSIVE METABOLIC PANEL
ALT: 9 U/L (ref 0–35)
AST: 18 U/L (ref 0–37)
CO2: 27 mEq/L (ref 19–32)
Chloride: 98 mEq/L (ref 96–112)
GFR calc Af Amer: >60 mL/min (ref >60)
GFR calc non Af Amer: >60 mL/min (ref >60)
Sodium: 133 mEq/L — ABNORMAL LOW (ref 135–145)
Total Bilirubin: 0.4 mg/dL (ref 0.3–1.2)

## 2010-10-16 ENCOUNTER — Telehealth (INDEPENDENT_AMBULATORY_CARE_PROVIDER_SITE_OTHER): Payer: Self-pay | Admitting: *Deleted

## 2010-10-17 LAB — URINE CULTURE
Culture  Setup Time: 201203042113
Culture: NO GROWTH

## 2010-10-18 ENCOUNTER — Telehealth (INDEPENDENT_AMBULATORY_CARE_PROVIDER_SITE_OTHER): Payer: Self-pay | Admitting: *Deleted

## 2010-10-19 ENCOUNTER — Encounter: Payer: Self-pay | Admitting: Adult Health

## 2010-10-19 ENCOUNTER — Ambulatory Visit (INDEPENDENT_AMBULATORY_CARE_PROVIDER_SITE_OTHER): Payer: Medicare Other | Admitting: Adult Health

## 2010-10-19 DIAGNOSIS — J189 Pneumonia, unspecified organism: Secondary | ICD-10-CM

## 2010-10-19 DIAGNOSIS — J449 Chronic obstructive pulmonary disease, unspecified: Secondary | ICD-10-CM

## 2010-10-19 NOTE — Progress Notes (Signed)
Summary: off O2  Phone Note Call from Patient Call back at Home Phone [phone]   Caller: Patient Call For: alva Summary of Call: pt has been off of O2 for 2 wks. "averaging" 95-96 on ra. pls advise. (this is per RA's instructions per pt).  Initial call taken by: Tivis Ringer, CNA,  October 06, 2010 10:18 AM  Follow-up for Phone Call        Pt states she has not used O2 for 2 weeks now. Average is 95% RA. No complaints. Please advise. Pt is aware Dr. Vassie Loll is out of the office. Thanks. Zackery Barefoot CMA  October 06, 2010 11:28 AM   Additional Follow-up for Phone Call Additional follow up Details #1::        order sent to dc O2  order given to [facility] to dc 02 pt aware Oneita Jolly  October 09, 2010 8:52 AM  Additional Follow-up by: Comer Locket. Vassie Loll MD,  October 07, 2010 8:50 PM

## 2010-10-21 LAB — CULTURE, BLOOD (ROUTINE X 2)
Culture  Setup Time: 201203042345
Culture: NO GROWTH

## 2010-10-24 LAB — LEGIONELLA ANTIGEN, URINE

## 2010-10-24 LAB — COMPREHENSIVE METABOLIC PANEL
ALT: 11 U/L (ref 0–35)
ALT: 8 U/L (ref 0–35)
ALT: <8 U/L (ref 0–35)
Alkaline Phosphatase: 103 U/L (ref 39–117)
Alkaline Phosphatase: 73 U/L (ref 39–117)
Alkaline Phosphatase: 78 U/L (ref 39–117)
BUN: 10 mg/dL (ref 6–23)
BUN: 6 mg/dL (ref 6–23)
BUN: 7 mg/dL (ref 6–23)
CO2: 25 mEq/L (ref 19–32)
CO2: 25 mEq/L (ref 19–32)
CO2: 26 mEq/L (ref 19–32)
Calcium: 8.3 mg/dL — ABNORMAL LOW (ref 8.4–10.5)
Chloride: 97 mEq/L (ref 96–112)
GFR calc non Af Amer: >60 mL/min (ref >60)
GFR calc non Af Amer: >60 mL/min (ref >60)
GFR calc non Af Amer: >60 mL/min (ref >60)
Glucose, Bld: 100 mg/dL — ABNORMAL HIGH (ref 70–99)
Glucose, Bld: 107 mg/dL — ABNORMAL HIGH (ref 70–99)
Glucose, Bld: 86 mg/dL (ref 70–99)
Potassium: 3.8 mEq/L (ref 3.5–5.1)
Potassium: 4.1 mEq/L (ref 3.5–5.1)
Sodium: 132 mEq/L — ABNORMAL LOW (ref 135–145)
Sodium: 133 mEq/L — ABNORMAL LOW (ref 135–145)
Sodium: 137 mEq/L (ref 135–145)
Total Bilirubin: 0.5 mg/dL (ref 0.3–1.2)
Total Bilirubin: 0.7 mg/dL (ref 0.3–1.2)
Total Protein: 5.2 g/dL — ABNORMAL LOW (ref 6.0–8.3)

## 2010-10-24 LAB — HEMOCCULT GUIAC POC 1CARD (OFFICE): Fecal Occult Bld: NEGATIVE

## 2010-10-24 LAB — DIFFERENTIAL
Basophils Absolute: 0 10*3/uL (ref 0.0–0.1)
Lymphs Abs: 0 10*3/uL — ABNORMAL LOW (ref 0.7–4.0)
Monocytes Absolute: 0 10*3/uL — ABNORMAL LOW (ref 0.1–1.0)
Neutrophils Relative %: 0 % — ABNORMAL LOW (ref 43–77)
nRBC: 0 /100 WBC

## 2010-10-24 LAB — STREP PNEUMONIAE URINARY ANTIGEN: Strep Pneumo Urinary Antigen: NEGATIVE

## 2010-10-24 LAB — CROSSMATCH
ABO/RH(D): A POS
ABO/RH(D): A POS
Unit division: 0

## 2010-10-24 LAB — CBC
HCT: 18.5 % — ABNORMAL LOW (ref 36.0–46.0)
HCT: 24.2 % — ABNORMAL LOW (ref 36.0–46.0)
HCT: 26.2 % — ABNORMAL LOW (ref 36.0–46.0)
HCT: 28.2 % — ABNORMAL LOW (ref 36.0–46.0)
Hemoglobin: 6.4 g/dL — CL (ref 12.0–15.0)
Hemoglobin: 8.3 g/dL — ABNORMAL LOW (ref 12.0–15.0)
Hemoglobin: 8.9 g/dL — ABNORMAL LOW (ref 12.0–15.0)
MCH: 29.4 pg (ref 26.0–34.0)
MCH: 29.7 pg (ref 26.0–34.0)
MCH: 29.7 pg (ref 26.0–34.0)
MCH: 29.8 pg (ref 26.0–34.0)
MCHC: 33.3 g/dL (ref 30.0–36.0)
MCHC: 33.8 g/dL (ref 30.0–36.0)
MCHC: 33.8 g/dL (ref 30.0–36.0)
MCHC: 34 g/dL (ref 30.0–36.0)
MCHC: 34.3 g/dL (ref 30.0–36.0)
MCHC: 34.4 g/dL (ref 30.0–36.0)
MCV: 85.6 fL (ref 78.0–100.0)
MCV: 86.7 fL (ref 78.0–100.0)
MCV: 86.8 fL (ref 78.0–100.0)
MCV: 88.4 fL (ref 78.0–100.0)
MCV: 89 fL (ref 78.0–100.0)
Platelets: 100 10*3/uL — ABNORMAL LOW (ref 150–400)
Platelets: 51 10*3/uL — ABNORMAL LOW (ref 150–400)
Platelets: 59 10*3/uL — ABNORMAL LOW (ref 150–400)
Platelets: 74 10*3/uL — ABNORMAL LOW (ref 150–400)
RBC: 3.02 MIL/uL — ABNORMAL LOW (ref 3.87–5.11)
RBC: 3.09 MIL/uL — ABNORMAL LOW (ref 3.87–5.11)
RDW: 12.9 % (ref 11.5–15.5)
RDW: 12.9 % (ref 11.5–15.5)
RDW: 13 % (ref 11.5–15.5)
RDW: 13.2 % (ref 11.5–15.5)
RDW: 13.3 % (ref 11.5–15.5)
RDW: 13.3 % (ref 11.5–15.5)
RDW: 13.3 % (ref 11.5–15.5)
WBC: 0.4 10*3/uL — CL (ref 4.0–10.5)
WBC: 10.9 10*3/uL — ABNORMAL HIGH (ref 4.0–10.5)
WBC: 9.7 10*3/uL (ref 4.0–10.5)
WBC: <0.4 10*3/uL — CL (ref 4.0–10.5)

## 2010-10-24 LAB — BASIC METABOLIC PANEL
BUN: 4 mg/dL — ABNORMAL LOW (ref 6–23)
BUN: 5 mg/dL — ABNORMAL LOW (ref 6–23)
BUN: 6 mg/dL (ref 6–23)
BUN: 7 mg/dL (ref 6–23)
CO2: 23 mEq/L (ref 19–32)
Calcium: 8.1 mg/dL — ABNORMAL LOW (ref 8.4–10.5)
Calcium: 8.3 mg/dL — ABNORMAL LOW (ref 8.4–10.5)
Chloride: 101 mEq/L (ref 96–112)
Chloride: 101 mEq/L (ref 96–112)
Creatinine, Ser: 0.52 mg/dL (ref 0.4–1.2)
Creatinine, Ser: 0.55 mg/dL (ref 0.4–1.2)
Creatinine, Ser: 0.56 mg/dL (ref 0.4–1.2)
Creatinine, Ser: 0.63 mg/dL (ref 0.4–1.2)
Creatinine, Ser: 0.68 mg/dL (ref 0.4–1.2)
GFR calc Af Amer: >60 mL/min (ref >60)
GFR calc non Af Amer: >60 mL/min (ref >60)
GFR calc non Af Amer: >60 mL/min (ref >60)
GFR calc non Af Amer: >60 mL/min (ref >60)
Glucose, Bld: 116 mg/dL — ABNORMAL HIGH (ref 70–99)
Glucose, Bld: 94 mg/dL (ref 70–99)
Glucose, Bld: 98 mg/dL (ref 70–99)
Potassium: 3.7 mEq/L (ref 3.5–5.1)

## 2010-10-24 LAB — CULTURE, BLOOD (ROUTINE X 2)
Culture  Setup Time: 201112020449
Culture  Setup Time: 201112020449
Culture: NO GROWTH

## 2010-10-24 LAB — URINALYSIS, ROUTINE W REFLEX MICROSCOPIC
Ketones, ur: NEGATIVE mg/dL
Nitrite: NEGATIVE
Urobilinogen, UA: 0.2 mg/dL (ref 0.0–1.0)
pH: 6.5 (ref 5.0–8.0)

## 2010-10-24 LAB — HEPARIN LEVEL (UNFRACTIONATED)
Heparin Unfractionated: 0.1 IU/mL — ABNORMAL LOW (ref 0.30–0.70)
Heparin Unfractionated: 0.11 IU/mL — ABNORMAL LOW (ref 0.30–0.70)
Heparin Unfractionated: 0.31 IU/mL (ref 0.30–0.70)
Heparin Unfractionated: <0.1 IU/mL — ABNORMAL LOW (ref 0.30–0.70)

## 2010-10-24 LAB — MAGNESIUM: Magnesium: 1.5 mg/dL (ref 1.5–2.5)

## 2010-10-24 LAB — PROTIME-INR
INR: 1.12 (ref 0.00–1.49)
Prothrombin Time: 14.6 seconds (ref 11.6–15.2)

## 2010-10-24 LAB — TSH: TSH: 1.299 u[IU]/mL (ref 0.350–4.500)

## 2010-10-24 LAB — PROCALCITONIN: Procalcitonin: 0.47 ng/mL

## 2010-10-24 LAB — CLOSTRIDIUM DIFFICILE BY PCR: Toxigenic C. Difficile by PCR: NOT DETECTED

## 2010-10-24 LAB — URINE CULTURE: Culture  Setup Time: 201112020424

## 2010-10-24 LAB — CARDIAC PANEL(CRET KIN+CKTOT+MB+TROPI): Troponin I: 0.03 ng/mL (ref 0.00–0.06)

## 2010-10-24 NOTE — Progress Notes (Signed)
Summary: medication issues  Phone Note Call from Patient Call back at Home Phone [phone]   Caller: Patient Call For: parrett Reason for Call: Talk to Nurse Summary of Call: Has some issues with avelox she would like to discuss with TP. Initial call taken by: Darletta Moll,  October 18, 2010 9:03 AM  Follow-up for Phone Call        Spoke with pt.  She states that she started avelox x 2 days ago,this was given by ED.  She states that that she read the side effects and "have all of them"- in specific, feels nauseated, vomiting, fatigue, loss of taste.  Wants to switch to a different med.  Would prefer something generic. Pls advise thanks allergic to pneumovax and shellfish Follow-up by: Vernie Murders,  October 18, 2010 9:52 AM  Additional Follow-up for Phone Call Additional follow up Details #1::        Add Prilosec 20mg  once daily  Eat small meals, frequently, bland  Switch Avelox to at bedtime  eat yogurt two times a day  Please contact office for sooner follow up if symptoms do not improve or worsen  pt aware  Additional Follow-up by: Tammy Parrett NP,  October 18, 2010 10:41 AM    Additional Follow-up for Phone Call Additional follow up Details #2::    make ov in am at 10am for follow up  and make ov in 2 weeks for post cxr  please call pt with times  Follow-up by: Rubye Oaks NP,  October 18, 2010 10:43 AM  Additional Follow-up for Phone Call Additional follow up Details #3:: Details for Additional Follow-up Action Taken: Called, spoke with pt.  States she has already spoken with TP but I review above recs with pt.  She verbalized understanding of this.  Appt scheduled with TP for tomorrow at 10am -- pt aware.  2 wk ov also scheduled for 10/30/10 for 10:15am -- pt aware. Additional Follow-up by: Gweneth Dimitri RN,  October 18, 2010 10:54 AM

## 2010-10-24 NOTE — Progress Notes (Signed)
Summary: seen at er has touch of pneumonia  Phone Note Call from Patient Call back at Home Phone [phone]   Caller: SPOUSE/RICHARD Call For: ALVA Summary of Call: Patients spouse Richard phoned and stated that Ms Onofrio was seen at [facility] ER yesterday and had a chest xray they told her that she has a touch of pneumonia in her right lung and that they should call our office today and advise because Dr Vassie Loll might want to review the xray and see her. They can be reached at [phone] Initial call taken by: Vedia Coffer,  October 16, 2010 9:20 AM  Follow-up for Phone Call        Gerlene Burdock states that pt was seen in ER was advised to have Dr. Vassie Loll review films. I advised caller RA out of the office and will have TP to review it. Richard stated pt has a consultation at the [facility] on Thursday to start radiation on 10/23/10 and wanted this addressed before then. Also states if pt needs to come in can it wait until Thursday? Okay to leave a detailed message on voicemail. Please advise if pt needs to come in and see you. Thanks Zackery Barefoot CMA  October 16, 2010 12:44 PM   Additional Follow-up for Phone Call Additional follow up Details #1::        Needs to finish abx and have ov in 1 week  ok to go to cancer center as well for consult.   Additional Follow-up by: Rubye Oaks NP,  October 16, 2010 2:57 PM    Additional Follow-up for Phone Call Additional follow up Details #2::    Spoke with pt's spouse and notified of the above recs per TP.  He verbalized understanding.  He states that he will call back in a few days to sched her appt, he was unsure which day she can come in next wk.  Follow-up by: Vernie Murders,  October 16, 2010 4:27 PM  If they need to be seen it needs on Thursday unless it is urgent that she be seen berore that. They can be reached at [phone] Initial call taken by: Vedia Coffer,  October 16, 2010 9:20 AM

## 2010-10-24 NOTE — Assessment & Plan Note (Addendum)
Summary: NP follow up - PNA   Copy to:  Dr. Gaylyn Rong Primary Provider/Referring Provider:  Dr. Windle Guard ([place])  CC:  3 day follow up from ER for PNA.  was given avelox.  pt states she is still DOE, leg fatigue, and dry mouth.  History of Present Illness:  64/F , heavy smoker > 50 Pyrs, for FU of COPD & subcarinal lymphadenopathy noted on staging PET scan.  She presented with a breast mass, diagnosed asT2 N1 Mx lobular breast ca with right axillary LN involvement. PET-CT showed negative right cervical Ln, negative 5 mm & 10 mm nodules in th elung & 9x 14 mm subcarinal Ln with malignant range uptake although this showed a hint of calcification on the CT images - EBUS neg.FU CT dec'11 >> no nodules, LN unchanged Underwent MRM, LNs pos -->ER , PR pos, Her-2 neu neg Pneumovax 04/03/10   Admitted 12/1-12/8 for RML PNA.  During her stay she developed a-flutter with cardilogy consult ([facility]).  Echo showed EF 45-50%, mild LV reduction , BNP was elevated at 755.   August 11, 2010  Spirometry shows FEV1 at 55%, rastio at 13. Previous FEV1 03/29/10 at 43%.   September 22, 2010 1:48 PM  on pulse O2, she has not smoked since discharge, Dys[nea stable- has not needed mDI much. CT scan reviewed. Denies chest pain,, orthopnea, hemoptysis, fever, n/v/d, edema, headache. Xray last ov showed improvment.   October 19, 2010 --Returns for 3 day follow up from ER for PNA. Seen in ER  3/5  found to have opacity   in left lower lobe suspicious for PNA - was given avelox.  pt states she is still has DOE, leg fatigue. she is feeling some better. Initially did not tolerate Avelox however is taking with food and applesause -Nausea has resolved. She was seen by oncology/radiation this am . She is starting 3/12 radiation treatments-33 total.  Denies chest pain,  orthopnea, hemoptysis, fever, n/v/d, edema, headache.  Medications Prior to Update: 1)  Aspirin 81 Mg Tabs (Aspirin) .... By Mouth Once Daily (On Hold) 2)   Calcium 600 Mg Tabs (Calcium) .... Take 1 Tablet By Mouth Once A Day 3)  Vitamin D 1000 Unit Tabs (Cholecalciferol) .... Take 1 Tablet By Mouth Once A Day 4)  Digoxin 0.125 Mg Tabs (Digoxin) .... Take 1 Tablet By Mouth Once A Day 5)  Lisinopril 5 Mg Tabs (Lisinopril) .... Take 1 Tablet By Mouth Once A Day 6)  Diltiazem Hcl Cr 240 Mg Xr24h-Cap (Diltiazem Hcl) .... Take 1 Tablet By Mouth Once A Day 7)  Furosemide 20 Mg Tabs (Furosemide) .... Take 1 Tablet By Mouth Two Times A Day 8)  Lidocaine-Prilocaine 2.5-2.5 % Crea (Lidocaine-Prilocaine) .... As Directed Before Treatments 9)  Oxygen .... Wear 2l/min Continuously 10)  Proair Hfa 108 (90 Base) Mcg/act Aers (Albuterol Sulfate) .... As Needed 11)  Prednisone 10 Mg Tabs (Prednisone) .... Taper As Directed  Current Medications (verified): 1)  Aspirin 81 Mg Tabs (Aspirin) .... By Mouth Once Daily 2)  Calcium 600 Mg Tabs (Calcium) .... Take 1 Tablet By Mouth Once A Day 3)  Vitamin D 1000 Unit Tabs (Cholecalciferol) .... Take 1 Tablet By Mouth Once A Day 4)  Digoxin 0.125 Mg Tabs (Digoxin) .... Take 1 Tablet By Mouth Once A Day 5)  Lisinopril 5 Mg Tabs (Lisinopril) .... Take 1 Tablet By Mouth Once A Day 6)  Diltiazem Hcl Cr 240 Mg Xr24h-Cap (Diltiazem Hcl) .... Take 1 Tablet  By Mouth Once A Day 7)  Furosemide 20 Mg Tabs (Furosemide) .... Take 1 Tablet By Mouth Two Times A Day 8)  Lidocaine-Prilocaine 2.5-2.5 % Crea (Lidocaine-Prilocaine) .... As Directed Before Treatments 9)  Proair Hfa 108 (90 Base) Mcg/act Aers (Albuterol Sulfate) .... Inhale 2 Puffs Every Four Hours As Needed 10)  Prednisone 10 Mg Tabs (Prednisone) .... Taper As Directed  Allergies (verified): 1)  ! * Pna Vaccine 2)  ! * Shellfish  Past History:  Past Medical History: Last updated: 03/29/2010 TIA Breast Cancer right   Past Surgical History: Last updated: 05/01/2010 Benign breast biopsy-- 20 years ago Mastectomy-right Sept 8, 2011 Lymph node removal-right Sept 8,  2011  Family History: Last updated: 03/28/2010 COPD- 3 sisters RA- 3 sisters  Social History: Last updated: 07/28/2010 Current smoker. 1 1/2 ppd Drinks 5 beers per day Lives with husband Children: yes pt quit smoking 07/14/2010  Risk Factors: Smoking Status: quit (09/22/2010) Packs/Day: 2.0 (09/22/2010)  Review of Systems      See HPI  Vital Signs:  Patient profile:   65 year old female Height:      64 inches Weight:      105 pounds BMI:     18.09 O2 Sat:      99 % on Room air Temp:     96.7 degrees F oral Pulse rate:   83 / minute BP sitting:   108 / 60  (left arm) Cuff size:   regular  Vitals Entered By: Boone Master CNA/MA (October 19, 2010 10:10 AM)  O2 Flow:  Room air CC: 3 day follow up from ER for PNA.  was given avelox.  pt states she is still DOE, leg fatigue, dry mouth Is Patient Diabetic? No Comments Medications reviewed with patient Daytime contact number verified with patient. Boone Master CNA/MA  October 19, 2010 10:09 AM    Physical Exam  Additional Exam:  wt 109 May 01, 2010 >110 September 22, 2010 >>105 October 19, 2010  Gen. Pleasant, thin woman, in no distress, normal affect ENT - no lesions, no post nasal drip Neck: No JVD, no thyromegaly, no carotid bruits Lungs:coarse bs w/ no wheezing or crackles  Cardiovascular: Rhythm regular, heart sounds  normal, no murmurs or gallops, no peripheral edema Musculoskeletal: No deformities, no cyanosis or clubbing      Impression & Recommendations:  Problem # 1:  PNEUMONIA (ICD-486)  cliinically improving, now tolerating Avelox  recommend  Finish Avelox, take with food/applesauce Fluids and rest  follow up in 2 weeks with chest xray as planned  follow up for radiation as planned.  Hold calcium and vitamin d for 2 weeks then restart.  Please contact office for sooner follow up if symptoms do not improve or worsen   Orders: Est. Patient Level III (47425)  Medications Added to Medication  List This Visit: 1)  Aspirin 81 Mg Tabs (Aspirin) .... By mouth once daily 2)  Proair Hfa 108 (90 Base) Mcg/act Aers (Albuterol sulfate) .... Inhale 2 puffs every four hours as needed  Patient Instructions: 1)  Finish Avelox, take with food/applesauce 2)  Fluids and rest  3)  follow up in 2 weeks with chest xray as planned  4)  follow up for radiation as planned.  5)  Hold calcium and vitamin d for 2 weeks then restart.  6)  Please contact office for sooner follow up if symptoms do not improve or worsen

## 2010-10-26 LAB — DIFFERENTIAL
Basophils Absolute: 0 10*3/uL (ref 0.0–0.1)
Basophils Relative: 1 % (ref 0–1)
Eosinophils Relative: 4 % (ref 0–5)
Eosinophils Relative: 2 % (ref 0–5)
Lymphocytes Relative: 17 % (ref 12–46)
Lymphs Abs: 0.8 10*3/uL (ref 0.7–4.0)
Monocytes Absolute: 0.4 10*3/uL (ref 0.1–1.0)
Monocytes Absolute: 0.4 10*3/uL (ref 0.1–1.0)
Monocytes Relative: 9 % (ref 3–12)
Neutro Abs: 3.8 10*3/uL (ref 1.7–7.7)

## 2010-10-26 LAB — CBC
HCT: 39.6 % (ref 36.0–46.0)
HCT: 38.2 % (ref 36.0–46.0)
Hemoglobin: 13.1 g/dL (ref 12.0–15.0)
Hemoglobin: 12.5 g/dL (ref 12.0–15.0)
MCH: 30.3 pg (ref 26.0–34.0)
MCV: 91.5 fL (ref 78.0–100.0)
MCV: 91.6 fL (ref 78.0–100.0)
Platelets: 219 10*3/uL (ref 150–400)
RBC: 4.33 MIL/uL (ref 3.87–5.11)
RBC: 4.17 MIL/uL (ref 3.87–5.11)
WBC: 4.9 10*3/uL (ref 4.0–10.5)
WBC: 5.1 10*3/uL (ref 4.0–10.5)

## 2010-10-26 LAB — COMPREHENSIVE METABOLIC PANEL
Albumin: 3.9 g/dL (ref 3.5–5.2)
Alkaline Phosphatase: 60 U/L (ref 39–117)
BUN: 5 mg/dL — ABNORMAL LOW (ref 6–23)
CO2: 30 mEq/L (ref 19–32)
Chloride: 102 mEq/L (ref 96–112)
Potassium: 4.8 mEq/L (ref 3.5–5.1)
Total Bilirubin: 0.8 mg/dL (ref 0.3–1.2)

## 2010-10-26 LAB — SURGICAL PCR SCREEN: Staphylococcus aureus: NEGATIVE

## 2010-10-27 LAB — CBC
MCH: 30.6 pg (ref 26.0–34.0)
MCHC: 33.3 g/dL (ref 30.0–36.0)
Platelets: 223 10*3/uL (ref 150–400)

## 2010-10-27 LAB — COMPREHENSIVE METABOLIC PANEL
AST: 18 U/L (ref 0–37)
Albumin: 3.7 g/dL (ref 3.5–5.2)
Calcium: 9.3 mg/dL (ref 8.4–10.5)
Creatinine, Ser: 0.69 mg/dL (ref 0.4–1.2)
GFR calc Af Amer: >60 mL/min (ref >60)

## 2010-10-27 LAB — FUNGUS CULTURE W SMEAR

## 2010-10-27 LAB — AFB CULTURE WITH SMEAR (NOT AT ARMC)

## 2010-10-27 LAB — APTT: aPTT: 27 seconds (ref 24–37)

## 2010-10-27 LAB — SURGICAL PCR SCREEN: Staphylococcus aureus: NEGATIVE

## 2010-10-30 ENCOUNTER — Other Ambulatory Visit: Payer: Self-pay | Admitting: *Deleted

## 2010-10-30 ENCOUNTER — Encounter: Payer: Self-pay | Admitting: Pulmonary Disease

## 2010-10-30 ENCOUNTER — Ambulatory Visit (INDEPENDENT_AMBULATORY_CARE_PROVIDER_SITE_OTHER)
Admission: RE | Admit: 2010-10-30 | Discharge: 2010-10-30 | Disposition: A | Discharge status: Home / Self Care | Payer: Medicare Other | Source: Ambulatory Visit | Attending: *Deleted | Admitting: *Deleted

## 2010-10-30 ENCOUNTER — Ambulatory Visit (INDEPENDENT_AMBULATORY_CARE_PROVIDER_SITE_OTHER): Payer: Medicare Other | Admitting: Adult Health

## 2010-10-30 ENCOUNTER — Encounter: Payer: Self-pay | Admitting: Adult Health

## 2010-10-30 DIAGNOSIS — J189 Pneumonia, unspecified organism: Secondary | ICD-10-CM

## 2010-10-30 DIAGNOSIS — J4489 Other specified chronic obstructive pulmonary disease: Secondary | ICD-10-CM

## 2010-10-30 DIAGNOSIS — J449 Chronic obstructive pulmonary disease, unspecified: Secondary | ICD-10-CM

## 2010-10-31 ENCOUNTER — Encounter (HOSPITAL_BASED_OUTPATIENT_CLINIC_OR_DEPARTMENT_OTHER): Payer: Medicare Other | Admitting: Oncology

## 2010-10-31 DIAGNOSIS — C50419 Malignant neoplasm of upper-outer quadrant of unspecified female breast: Secondary | ICD-10-CM

## 2010-11-09 NOTE — Assessment & Plan Note (Signed)
Summary: 2 weeks for post cxr per TP / cj   Copy to:  Dr. Gaylyn Rong Primary Provider/Referring Provider:  Dr. Windle Guard ([place])  CC:  2 week rov with cxr.  History of Present Illness:  [name] , heavy smoker > 50 Pyrs, for FU of COPD & subcarinal lymphadenopathy noted on staging PET scan.  She presented with a breast mass, diagnosed asT2 N1 Mx lobular breast ca with right axillary LN involvement. PET-CT showed negative right cervical Ln, negative 5 mm & 10 mm nodules in th elung & 9x 14 mm subcarinal Ln with malignant range uptake although this showed a hint of calcification on the CT images - EBUS neg.FU CT dec'11 >> no nodules, LN unchanged Underwent MRM, LNs pos -->ER , PR pos, Her-2 neu neg Pneumovax 04/03/10   Admitted 12/1-12/8 for RML PNA.  During her stay she developed a-flutter with cardilogy consult ([facility]).  Echo showed EF 45-50%, mild LV reduction , BNP was elevated at 755.   August 11, 2010  Spirometry shows FEV1 at 55%, rastio at 35. Previous FEV1 03/29/10 at 43%.   September 22, 2010 1:48 PM  on pulse O2, she has not smoked since discharge, Dys[nea stable- has not needed mDI much. CT scan reviewed. Denies chest pain,, orthopnea, hemoptysis, fever, n/v/d, edema, headache. Xray last ov showed improvment.   October 19, 2010 --Returns for 3 day follow up from ER for PNA. Seen in ER  3/5  found to have opacity   in left lower lobe suspicious for PNA - was given avelox.  pt states she is still has DOE, leg fatigue. she is feeling some better. Initially did not tolerate Avelox however is taking with food and applesause -Nausea has resolved. She was seen by oncology/radiation this am . She is starting 3/12 radiation treatments-33 total.  >rec to finish Avelox  October 30, 2010 -Returns for follow up. Last ov with PNA dx in ER. Tx w/ Avelox . She has now finished abx. Feeling better w/ less cough and dyspnea. Has started radiation.  Does not feel as weak. Starting to get energy back.  Denies chest pain,  orthopnea, hemoptysis, fever, n/v/d,  headache.   Current Medications (verified): 1)  Aspirin 81 Mg Tabs (Aspirin) .... By Mouth Once Daily 2)  Calcium 600 Mg Tabs (Calcium) .... Take 1 Tablet By Mouth Once A Day 3)  Vitamin D 1000 Unit Tabs (Cholecalciferol) .... Take 1 Tablet By Mouth Once A Day 4)  Digoxin 0.125 Mg Tabs (Digoxin) .... Take 1 Tablet By Mouth Once A Day 5)  Lisinopril 5 Mg Tabs (Lisinopril) .... Take 1 Tablet By Mouth Once A Day 6)  Diltiazem Hcl Cr 240 Mg Xr24h-Cap (Diltiazem Hcl) .... Take 1 Tablet By Mouth Once A Day 7)  Furosemide 20 Mg Tabs (Furosemide) .... Take 1 Tablet By Mouth Two Times A Day 8)  Lidocaine-Prilocaine 2.5-2.5 % Crea (Lidocaine-Prilocaine) .... As Directed Before Treatments 9)  Proair Hfa 108 (90 Base) Mcg/act Aers (Albuterol Sulfate) .... Inhale 2 Puffs Every Four Hours As Needed 10)  Tylenol Extra Strength 500 Mg Tabs (Acetaminophen) .... Take One By Mouth Four Times A Day As Needed  Allergies (verified): 1)  ! * Pna Vaccine 2)  ! * Shellfish  Comments:  Nurse/Medical Assistant: The patient's medications and allergies were reviewed with the patient and were updated in the Medication and Allergy Lists.  Past History:  Past Medical History: Last updated: 03/29/2010 TIA Breast Cancer right   Past  Surgical History: Last updated: 05/01/2010 Benign breast biopsy-- 20 years ago Mastectomy-right Sept 8, 2011 Lymph node removal-right Sept 8, 2011  Social History: Last updated: 10/30/2010   Drinks 5 beers per day Lives with husband Children: yes pt quit smoking 07/14/2010  Social History:   Drinks 5 beers per day Lives with husband Children: yes pt quit smoking 07/14/2010  Review of Systems      See HPI  Vital Signs:  Patient profile:   65 year old female Weight:      105.50 pounds O2 Sat:      95 % on Room air Temp:     97.8 degrees F oral Pulse rate:   69 / minute BP sitting:   106 / 50  (right  arm) Cuff size:   regular  Vitals Entered By: Abigail Miyamoto RN (October 30, 2010 10:19 AM)  O2 Flow:  Room air  Physical Exam  Additional Exam:  wt 109 May 01, 2010 >110 September 22, 2010 >>105 October 19, 2010 >105 October 30, 2010  Gen. Pleasant, thin woman, in no distress, normal affect ENT - no lesions, no post nasal drip Neck: No JVD, no thyromegaly, no carotid bruits Lungs:coarse bs w/ no wheezing or crackles  Cardiovascular: Rhythm regular, heart sounds  normal, no murmurs or gallops, tr on left peripheral edema Musculoskeletal: No deformities, no cyanosis or clubbing      Impression & Recommendations:  Problem # 1:  PNEUMONIA (ICD-486)  clinically improving now back baseline no further abx at this time cxr pending.   Orders: Est. Patient Level IV (08657)  Problem # 2:  C O P D (ICD-496) discussed maintence meds she declines spiriva or advair at this time.  does not feel breathing is  problem   Medications Added to Medication List This Visit: 1)  Tylenol Extra Strength 500 Mg Tabs (Acetaminophen) .... Take one by mouth four times a day as needed  Patient Instructions: 1)  Continue on same regimen.  2)  follow up Dr. Vassie Loll in 2 months and as needed     Appended Document: 2 weeks for post cxr per TP / cj cXr with no acute process  cont with ov recs   Appended Document: 2 weeks for post cxr per TP / cj called spoke with patient.  advised of cxr results/recs as stated by TP in above append.  pt verbalized her understanding.

## 2010-11-09 NOTE — Miscellaneous (Signed)
Summary: Orders Update  Clinical Lists Changes  Orders: Added new Test order of T-2 View CXR (71020TC) - Signed    pt in xray. Pt last OV note with TP to come in for follow up. Zackery Barefoot CMA  October 30, 2010 9:51 AM

## 2010-11-10 ENCOUNTER — Telehealth: Payer: Self-pay | Admitting: Cardiovascular Disease

## 2010-11-10 NOTE — Telephone Encounter (Signed)
PT HAS APPT IN April BUT WILL RUN OUT OF MEDS BEFORE THERE. CALL BACK AND SW PT'S HUSBAND. PLACED CHART IN BOX.

## 2010-11-23 ENCOUNTER — Encounter: Payer: Self-pay | Admitting: Cardiovascular Disease

## 2010-11-23 DIAGNOSIS — R0902 Hypoxemia: Secondary | ICD-10-CM | POA: Insufficient documentation

## 2010-11-23 DIAGNOSIS — D709 Neutropenia, unspecified: Secondary | ICD-10-CM | POA: Insufficient documentation

## 2010-11-23 DIAGNOSIS — I5032 Chronic diastolic (congestive) heart failure: Secondary | ICD-10-CM | POA: Insufficient documentation

## 2010-11-23 DIAGNOSIS — I4892 Unspecified atrial flutter: Secondary | ICD-10-CM | POA: Insufficient documentation

## 2010-11-23 DIAGNOSIS — Z853 Personal history of malignant neoplasm of breast: Secondary | ICD-10-CM | POA: Insufficient documentation

## 2010-11-23 DIAGNOSIS — J449 Chronic obstructive pulmonary disease, unspecified: Secondary | ICD-10-CM | POA: Insufficient documentation

## 2010-11-23 DIAGNOSIS — J189 Pneumonia, unspecified organism: Secondary | ICD-10-CM | POA: Insufficient documentation

## 2010-11-24 ENCOUNTER — Encounter: Payer: Self-pay | Admitting: Cardiovascular Disease

## 2010-11-24 ENCOUNTER — Ambulatory Visit (INDEPENDENT_AMBULATORY_CARE_PROVIDER_SITE_OTHER): Payer: Medicare Other | Admitting: Cardiovascular Disease

## 2010-11-24 VITALS — BP 122/58 | HR 64 | Ht 64.0 in | Wt 106.2 lb

## 2010-11-24 DIAGNOSIS — R0989 Other specified symptoms and signs involving the circulatory and respiratory systems: Secondary | ICD-10-CM | POA: Insufficient documentation

## 2010-11-24 DIAGNOSIS — I4892 Unspecified atrial flutter: Secondary | ICD-10-CM

## 2010-11-24 NOTE — Assessment & Plan Note (Signed)
She has bilateral carotid bruits. Her left-sided bruits much louder. She is currently getting radiation and the radiation being instructed strictly to the left side of neck. I don't think that she should be evaluated at this time but we should wait until after she's through with her radiation therapy. We will need get carotid duplex scan at some point in the near future.

## 2010-11-24 NOTE — Progress Notes (Signed)
Rachel Mcbride Date of Birth  07/18/1946 [facility] [address]     [address] [place], [place]  [address] [phone]  Fax  [phone]  History of Present Illness:  Rachel Mcbride is a middle-aged female with a history of breast cancer. She is status post chemotherapy. She is now receiving radiation therapy. She has a history of atrial flutter. Her last EKG from our office reveals sinus rhythm.  She's done quite well from a cardiac standpoint. She's not had any episodes of chest pain and her shortness of breath.  Current Outpatient Prescriptions on File Prior to Visit  Medication Sig Dispense Refill  . aspirin 81 MG tablet Take 81 mg by mouth daily.        . Calcium Carbonate (CALCIUM 600 PO) Take by mouth daily.        . Cholecalciferol (VITAMIN D PO) Take 1,000 Units by mouth daily.        . digoxin (LANOXIN) 0.125 MG tablet Take 125 mcg by mouth daily.        Marland Kitchen diltiazem (CARDIZEM CD) 240 MG 24 hr capsule Take 240 mg by mouth daily.        . furosemide (LASIX) 20 MG tablet Take 20 mg by mouth 2 (two) times daily.        Marland Kitchen lisinopril (PRINIVIL,ZESTRIL) 5 MG tablet Take 5 mg by mouth daily.        . Albuterol Sulfate (PROAIR HFA IN) Inhale into the lungs as needed.        . OXYGEN-HELIUM IN Inhale 2 L into the lungs as needed.          Allergies  Allergen Reactions  . Pneumococcal Vaccines   . Shellfish-Derived Products     Past Medical History  Diagnosis Date  . Breast cancer   . Neutropenia   . Pneumonia   . Atrial flutter   . Hypoxemia   . CHF (congestive heart failure)     DIASTOLIC  . COPD (chronic obstructive pulmonary disease)     History reviewed. No pertinent past surgical history.  History  Smoking status  . Former Smoker  . Types: Cigarettes  . Quit date: 07/14/2010  Smokeless tobacco  . Not on file    History  Alcohol Use No    Family History  Problem Relation Age of Onset  . Heart attack Mother   .  Stroke Mother     Reviw of Systems:  Reviewed in the HPI.  All other systems are negative.  Physical Exam: BP 122/58  Pulse 64  Ht 5\' 4"  (1.626 m)  Wt 106 lb 3.2 oz (48.172 kg)  BMI 18.23 kg/m2 The patient is alert and oriented x 3.  The mood and affect are normal.  The skin is warm and dry.  Color is normal.  The HEENT exam reveals that the sclera are nonicteric.  The mucous membranes are moist.  The carotids are 2+.  There is a moderate bruit on the left and a very soft bruit on the right.  There is no thyromegaly.  There is no JVD.  The lungs are clear.  The chest wall is non tender.  The heart exam reveals a regular rate with a normal S1 and S2.  There are no murmurs, gallops, or rubs.  The PMI is not displaced.   Abdominal exam reveals good bowel sounds.  There is no guarding or rebound.  There is no hepatosplenomegaly or tenderness.  There are no masses.  Exam of the legs reveal no clubbing, cyanosis, or edema.  The legs are without rashes.  The distal pulses are intact.  Cranial nerves II - XII are intact.  Motor and sensory functions are intact.  The gait is normal.  Assessment / Plan:

## 2010-11-24 NOTE — Assessment & Plan Note (Signed)
Her atrial flutter seems to be well-controlled. She's not having symptoms. Her heart rate is fairly normal. I suspect that she still in sinus rhythm. We'll continue to follow her regularly.

## 2010-12-07 ENCOUNTER — Other Ambulatory Visit: Payer: Self-pay | Admitting: Oncology

## 2010-12-07 ENCOUNTER — Encounter (HOSPITAL_BASED_OUTPATIENT_CLINIC_OR_DEPARTMENT_OTHER): Payer: Medicare Other | Admitting: Oncology

## 2010-12-07 DIAGNOSIS — Z170 Estrogen receptor positive status [ER+]: Secondary | ICD-10-CM

## 2010-12-07 DIAGNOSIS — C773 Secondary and unspecified malignant neoplasm of axilla and upper limb lymph nodes: Secondary | ICD-10-CM

## 2010-12-07 DIAGNOSIS — Z853 Personal history of malignant neoplasm of breast: Secondary | ICD-10-CM

## 2010-12-07 DIAGNOSIS — C50419 Malignant neoplasm of upper-outer quadrant of unspecified female breast: Secondary | ICD-10-CM

## 2010-12-07 DIAGNOSIS — Z1231 Encounter for screening mammogram for malignant neoplasm of breast: Secondary | ICD-10-CM

## 2010-12-07 DIAGNOSIS — M949 Disorder of cartilage, unspecified: Secondary | ICD-10-CM

## 2010-12-07 LAB — CBC WITH DIFFERENTIAL/PLATELET
Basophils Absolute: 0 10*3/uL (ref 0.0–0.1)
EOS%: 2.8 % (ref 0.0–7.0)
HGB: 10.9 g/dL — ABNORMAL LOW (ref 11.6–15.9)
MCH: 32.5 pg (ref 25.1–34.0)
NEUT#: 1.9 10*3/uL (ref 1.5–6.5)
RDW: 14.1 % (ref 11.2–14.5)
lymph#: 0.7 10*3/uL — ABNORMAL LOW (ref 0.9–3.3)

## 2010-12-07 LAB — COMPREHENSIVE METABOLIC PANEL
ALT: 23 U/L (ref 0–35)
AST: 30 U/L (ref 0–37)
Albumin: 4.3 g/dL (ref 3.5–5.2)
BUN: 11 mg/dL (ref 6–23)
Calcium: 10 mg/dL (ref 8.4–10.5)
Chloride: 99 mEq/L (ref 96–112)
Potassium: 4.2 mEq/L (ref 3.5–5.3)
Sodium: 138 mEq/L (ref 135–145)
Total Protein: 6.9 g/dL (ref 6.0–8.3)

## 2011-01-15 ENCOUNTER — Ambulatory Visit
Admission: RE | Admit: 2011-01-15 | Discharge: 2011-01-15 | Disposition: A | Discharge status: Home / Self Care | Payer: Medicare Other | Source: Ambulatory Visit | Attending: Radiation Oncology | Admitting: Radiation Oncology

## 2011-02-08 ENCOUNTER — Encounter (HOSPITAL_BASED_OUTPATIENT_CLINIC_OR_DEPARTMENT_OTHER): Payer: Medicare Other | Admitting: Oncology

## 2011-02-08 DIAGNOSIS — Z170 Estrogen receptor positive status [ER+]: Secondary | ICD-10-CM

## 2011-02-08 DIAGNOSIS — C50419 Malignant neoplasm of upper-outer quadrant of unspecified female breast: Secondary | ICD-10-CM

## 2011-02-08 DIAGNOSIS — M949 Disorder of cartilage, unspecified: Secondary | ICD-10-CM

## 2011-02-08 DIAGNOSIS — C773 Secondary and unspecified malignant neoplasm of axilla and upper limb lymph nodes: Secondary | ICD-10-CM

## 2011-02-15 ENCOUNTER — Other Ambulatory Visit (INDEPENDENT_AMBULATORY_CARE_PROVIDER_SITE_OTHER): Payer: Medicare Other

## 2011-02-15 ENCOUNTER — Ambulatory Visit (INDEPENDENT_AMBULATORY_CARE_PROVIDER_SITE_OTHER): Payer: Medicare Other | Admitting: Pulmonary Disease

## 2011-02-15 ENCOUNTER — Encounter: Payer: Self-pay | Admitting: Pulmonary Disease

## 2011-02-15 VITALS — BP 106/62 | HR 72 | Temp 97.6°F | Ht 64.0 in | Wt 105.8 lb

## 2011-02-15 DIAGNOSIS — R599 Enlarged lymph nodes, unspecified: Secondary | ICD-10-CM

## 2011-02-15 DIAGNOSIS — J449 Chronic obstructive pulmonary disease, unspecified: Secondary | ICD-10-CM

## 2011-02-15 DIAGNOSIS — J189 Pneumonia, unspecified organism: Secondary | ICD-10-CM

## 2011-02-15 LAB — BASIC METABOLIC PANEL
CO2: 28 mEq/L (ref 19–32)
Calcium: 9.2 mg/dL (ref 8.4–10.5)
Glucose, Bld: 86 mg/dL (ref 70–99)
Potassium: 4 mEq/L (ref 3.5–5.1)
Sodium: 133 mEq/L — ABNORMAL LOW (ref 135–145)

## 2011-02-15 NOTE — Patient Instructions (Signed)
Follow up CT scan of chest with dye Your COPD is stable

## 2011-02-15 NOTE — Progress Notes (Signed)
  Subjective:    Patient ID: Rachel Mcbride, female    DOB: 10-15-1945, 65 y.o.   MRN: [id number]  HPI [name] , heavy smoker > 50 Pyrs, for FU of COPD & subcarinal lymphadenopathy noted on staging PET scan.  Dec 2011 Spirometry FEV1 at 55%, ratio at 74. Previous FEV1 8/11 at 43%.   She presented with a breast mass in 8/11, diagnosed asT2 N1 Mx lobular breast ca with right axillary LN involvement. PET-CT showed negative right cervical Ln, negative 5 mm & 10 mm nodules in the lung & 9x 14 mm subcarinal Ln with malignant range uptake although this showed a hint of calcification on the CT images - EBUS neg. FU CT dec'11 >> no nodules, LN unchanged  Underwent MRM, LNs pos -->ER , PR pos, Her-2 neu neg  Pneumovax 04/03/10  Admitted 12/1-12/8 for RML PNA. During her stay she developed a-flutter with cardilogy consult ([facility]). Echo showed EF 45-50%, mild LV reduction , BNP was elevated at 755.    02/15/11 Treated for pna in 3/12, has completed RT Saw Dr Gaylyn Rong , no rpt imaging scheduled. Has quit smoking & feels well, no dyspnea or wheeze or cough Denies chest pain, orthopnea, hemoptysis, fever, n/v/d, headache.     Review of Systems Pt denies any significant  nasal congestion or excess secretions, fever, chills, sweats, unintended wt loss, pleuritic or exertional cp, orthopnea pnd or leg swelling.  Pt also denies any obvious fluctuation in symptoms with weather or environmental change or other alleviating or aggravating factors.    Pt denies any increase in rescue therapy over baseline, denies waking up needing it or having early am exacerbations or coughing/wheezing/ or dyspnea      Objective:   Physical Exam Gen. Pleasant, well-nourished, in no distress ENT - no lesions, no post nasal drip Neck: No JVD, no thyromegaly, no carotid bruits Lungs: no use of accessory muscles, no dullness to percussion, clear without rales or rhonchi  Cardiovascular: Rhythm regular, heart sounds  normal, no murmurs  or gallops, no peripheral edema Musculoskeletal: No deformities, no cyanosis or clubbing         Assessment & Plan:

## 2011-02-16 NOTE — Assessment & Plan Note (Signed)
Rpt CT with contrast  Will need serial imaging to demonstrate stability until aug'12

## 2011-02-16 NOTE — Assessment & Plan Note (Signed)
Stable, OK to hold off LABA

## 2011-02-16 NOTE — Assessment & Plan Note (Signed)
resolved 

## 2011-02-21 ENCOUNTER — Ambulatory Visit (INDEPENDENT_AMBULATORY_CARE_PROVIDER_SITE_OTHER)
Admission: RE | Admit: 2011-02-21 | Discharge: 2011-02-21 | Disposition: A | Discharge status: Home / Self Care | Payer: Medicare Other | Source: Ambulatory Visit | Attending: Pulmonary Disease | Admitting: Pulmonary Disease

## 2011-02-21 DIAGNOSIS — R599 Enlarged lymph nodes, unspecified: Secondary | ICD-10-CM

## 2011-02-21 MED ORDER — IOHEXOL 300 MG/ML  SOLN
80.0000 mL | Freq: Once | INTRAMUSCULAR | Status: AC | PRN
  Administered 2011-02-21: 80 mL via INTRAVENOUS

## 2011-02-28 ENCOUNTER — Telehealth: Payer: Self-pay | Admitting: Pulmonary Disease

## 2011-02-28 NOTE — Telephone Encounter (Signed)
Notes Recorded by Zackery Barefoot, CMA on 02/27/2011 at 5:49 PM lmomtcb x 1 ------  Notes Recorded by Comer Locket. Vassie Loll, MD on 02/21/2011 at 4:01 PM Lymph nodes not enlarged. New 5 mm spot in RUL needs FU CT in 6 mnths   Called, spoke with pt.  She was informed Shanda Bumps calling to inform her of CT Chest results.  Advised, per RA, lymph nodes not enlarged but there is a spot in RUL which RA recs a f/u CT in 6 months.  Pt verbalized understanding of this and pt will call back in 6 months to remind [place] that this needs to be done.

## 2011-03-12 ENCOUNTER — Ambulatory Visit
Admission: RE | Admit: 2011-03-12 | Discharge: 2011-03-12 | Disposition: A | Discharge status: Home / Self Care | Payer: Medicare Other | Source: Ambulatory Visit | Attending: Oncology | Admitting: Oncology

## 2011-03-12 ENCOUNTER — Other Ambulatory Visit: Payer: Self-pay | Admitting: Oncology

## 2011-03-12 DIAGNOSIS — Z1231 Encounter for screening mammogram for malignant neoplasm of breast: Secondary | ICD-10-CM

## 2011-03-12 DIAGNOSIS — Z853 Personal history of malignant neoplasm of breast: Secondary | ICD-10-CM

## 2011-04-12 ENCOUNTER — Encounter (HOSPITAL_BASED_OUTPATIENT_CLINIC_OR_DEPARTMENT_OTHER): Payer: Medicare Other | Admitting: Oncology

## 2011-04-12 ENCOUNTER — Other Ambulatory Visit: Payer: Self-pay | Admitting: Oncology

## 2011-04-12 DIAGNOSIS — C50419 Malignant neoplasm of upper-outer quadrant of unspecified female breast: Secondary | ICD-10-CM

## 2011-04-12 DIAGNOSIS — M949 Disorder of cartilage, unspecified: Secondary | ICD-10-CM

## 2011-04-12 DIAGNOSIS — Z170 Estrogen receptor positive status [ER+]: Secondary | ICD-10-CM

## 2011-04-12 DIAGNOSIS — C773 Secondary and unspecified malignant neoplasm of axilla and upper limb lymph nodes: Secondary | ICD-10-CM

## 2011-04-12 LAB — CBC WITH DIFFERENTIAL/PLATELET
BASO%: 0.5 % (ref 0.0–2.0)
EOS%: 3.7 % (ref 0.0–7.0)
HCT: 33.4 % — ABNORMAL LOW (ref 34.8–46.6)
HGB: 11.4 g/dL — ABNORMAL LOW (ref 11.6–15.9)
MCHC: 34 g/dL (ref 31.5–36.0)
MONO%: 11.7 % (ref 0.0–14.0)
NEUT#: 2.5 10*3/uL (ref 1.5–6.5)
Platelets: 211 10*3/uL (ref 145–400)
WBC: 3.8 10*3/uL — ABNORMAL LOW (ref 3.9–10.3)

## 2011-04-12 LAB — COMPREHENSIVE METABOLIC PANEL
ALT: <8 U/L (ref 0–35)
AST: 18 U/L (ref 0–37)
Albumin: 4.3 g/dL (ref 3.5–5.2)
Alkaline Phosphatase: 50 U/L (ref 39–117)
BUN: 13 mg/dL (ref 6–23)
Potassium: 4.2 mEq/L (ref 3.5–5.3)
Sodium: 136 mEq/L (ref 135–145)
Total Protein: 7 g/dL (ref 6.0–8.3)

## 2011-05-08 NOTE — Progress Notes (Signed)
Quick Note:  Multiple messages have been left for pt to call back. Will send out letter. ______

## 2011-05-15 ENCOUNTER — Encounter: Payer: Self-pay | Admitting: *Deleted

## 2011-05-21 ENCOUNTER — Telehealth: Payer: Self-pay | Admitting: Pulmonary Disease

## 2011-05-21 DIAGNOSIS — J984 Other disorders of lung: Secondary | ICD-10-CM

## 2011-05-21 NOTE — Telephone Encounter (Signed)
Lymph nodes not enlarged. New 5 mm spot in RUL needs FU CT in 6 mnths  I spoke with patient about results and he verbalized understanding and had no questions   Please advise Dr. Vassie Loll is ct needs to be with/without contrast. Thanks  Carver Fila, CMA

## 2011-05-21 NOTE — Telephone Encounter (Signed)
Order has been placed.

## 2011-05-21 NOTE — Telephone Encounter (Signed)
With contrast

## 2011-05-30 ENCOUNTER — Encounter: Payer: Self-pay | Admitting: Cardiovascular Disease

## 2011-05-30 ENCOUNTER — Ambulatory Visit (INDEPENDENT_AMBULATORY_CARE_PROVIDER_SITE_OTHER): Payer: Medicare Other | Admitting: Cardiovascular Disease

## 2011-05-30 DIAGNOSIS — I509 Heart failure, unspecified: Secondary | ICD-10-CM

## 2011-05-30 DIAGNOSIS — I4892 Unspecified atrial flutter: Secondary | ICD-10-CM

## 2011-05-30 MED ORDER — LISINOPRIL 10 MG PO TABS: 10.0000 mg | ORAL_TABLET | Freq: Every day | ORAL | Status: DC

## 2011-05-30 NOTE — Assessment & Plan Note (Signed)
She has a history of mildly reduced left ventricular systolic function with an ejection fraction 45%. She has a long history of cigarette smoking and affect also has COPD.  I think it would be reasonable to further investigate her mildly reduced left ventricle systolic function with a Lexus scan Myoview study. We will plan on seeing her again in the office in 6 months we'll see her sooner if the Myoview study is abnormal.

## 2011-05-30 NOTE — Assessment & Plan Note (Signed)
She's not had any recurrent episodes of atrial flutter. We will discontinue the digoxin and diltiazem. We will increase her lisinopril to 10 mg a day.

## 2011-05-30 NOTE — Progress Notes (Signed)
Rachel Mcbride Date of Birth  September 25, 1945 [facility] [address]    [address] [place], [place]  [address] [phone]  Fax  [phone]  History of Present Illness:  65 year old female pitch has a history of atrial flutter. She also has a history of breast cancer.  She's now through her chemotherapy and radiation.  SHe stopped smoking last year now smokes an electronic cigarette.  She has a history of COPD. Her oxygen levels have been low but have been gradually improving.  She still has some generalized shortness breath. She does not get any regular exercise. She denies any syncope or presyncope.  Current Outpatient Prescriptions on File Prior to Visit  Medication Sig Dispense Refill  . alendronate (FOSAMAX) 35 MG tablet Take 1 tablet by mouth Once a week.      Marland Kitchen anastrozole (ARIMIDEX) 1 MG tablet Take 1 tablet by mouth daily.      Marland Kitchen aspirin 81 MG tablet Take 81 mg by mouth daily.        . Calcium Carbonate (CALCIUM 600 PO) Take by mouth 2 (two) times daily.       . Cholecalciferol (VITAMIN D PO) Take 1,000 Units by mouth daily.        . digoxin (LANOXIN) 0.125 MG tablet Take 125 mcg by mouth daily.        Marland Kitchen diltiazem (CARDIZEM CD) 240 MG 24 hr capsule Take 240 mg by mouth daily.        . furosemide (LASIX) 20 MG tablet Take 20 mg by mouth 2 (two) times daily.        Marland Kitchen lisinopril (PRINIVIL,ZESTRIL) 5 MG tablet Take 5 mg by mouth daily.          Allergies  Allergen Reactions  . Pneumococcal Vaccines   . Shellfish-Derived Products     Past Medical History  Diagnosis Date  . Breast cancer   . Neutropenia   . Pneumonia   . Atrial flutter   . Hypoxemia   . CHF (congestive heart failure)     DIASTOLIC  . COPD (chronic obstructive pulmonary disease)     No past surgical history on file.  History  Smoking status  . Former Smoker -- 2.0 packs/day for 50 years  . Types: Cigarettes  . Quit date: 07/14/2010  Smokeless tobacco  . Never Used    History  Alcohol  Use No    Family History  Problem Relation Age of Onset  . Heart attack Mother   . Stroke Mother     Reviw of Systems:  Reviewed in the HPI.  All other systems are negative.  Physical Exam: BP 131/60  Pulse 58  Ht 5' 4.5" (1.638 m)  Wt 103 lb 12.8 oz (47.083 kg)  BMI 17.54 kg/m2 The patient is alert and oriented x 3.  The mood and affect are normal.   Skin: warm and dry.  Color is normal.    HEENT:   the sclera are nonicteric.  The mucous membranes are moist.  The carotids are 2+ without bruits.  There is no thyromegaly.  There is no JVD.    Lungs: clear.  The chest wall is non tender.    Heart: regular rate with a normal S1 and S2.  There are no murmurs, gallops, or rubs. The PMI is not displaced.     Abdomen: good bowel sounds.  There is no guarding or rebound.  There is no hepatosplenomegaly or tenderness.  There are no masses.  Extremities:  no clubbing, cyanosis, or edema.  The legs are without rashes.  The distal pulses are intact.   Neuro:  Cranial nerves II - XII are intact.  Motor and sensory functions are intact.    The gait is normal.  ECG: Sinus tachycardia at 55 beats per minute.  Assessment / Plan:

## 2011-05-30 NOTE — Patient Instructions (Addendum)
Your physician wants you to follow-up in: 6 months with ekg, You will receive a reminder letter in the mail two months in advance. If you don't receive a letter, please call our office to schedule the follow-up appointment.  Your physician has requested that you have a lexiscan myoview. For further information please visit [email or web address] Please follow instruction sheet, as given.   Your physician has recommended you make the following change in your medication:  1) STOP digoxin 2) STOP Cardizem 3) increase lisinopril to 10 mg daily

## 2011-05-31 ENCOUNTER — Encounter: Payer: Medicare Other | Admitting: Oncology

## 2011-06-18 ENCOUNTER — Ambulatory Visit (HOSPITAL_COMMUNITY): Payer: Medicare Other | Admitting: Radiology

## 2011-06-18 ENCOUNTER — Telehealth: Payer: Self-pay | Admitting: Cardiovascular Disease

## 2011-06-18 ENCOUNTER — Encounter (HOSPITAL_COMMUNITY): Payer: Self-pay

## 2011-06-18 NOTE — Progress Notes (Signed)
  Rachel Mcbride here for [name] with her husband today. They need to know how much their insurance will pay for this test because they have a $5000.00 deductible. They also want to know the reason for the test. They want to reschedule the test and find out how much their insurance will pay, and then they will call and discuss with Dr. Harvie Bridge nurse. Ahmani Daoud,RN

## 2011-06-18 NOTE — Telephone Encounter (Signed)
New message-Pt wants to know the cost of stress test so medicare and bcbs will pay 100% He said he wants to what he will be required to pay if anything up front before test is scheduled.

## 2011-06-18 NOTE — Telephone Encounter (Signed)
lexiscan ordered showed up today for appointment and wanted cost figures,  please verify all codes needed for test to be done, husband very upset, has called today and was given codes; 8574, 9610, 9092, 4069. These were codes that [facility] didn't recognize. I want to ensure he is given correct codes, I called Cydney Ok on her cell via [place] and updated her with situation. I gave dx; 428.0 chf, 427.32 aflutter, for tests. Pt informed he would be called back tomorrow by Cydney Ok by noon. Pt and Darl Pikes aware. Alfonso Ramus RN

## 2011-06-20 NOTE — Telephone Encounter (Signed)
Had multiple calls to the husband about the cost. She has not met her deductible for the year and they are concerned about paying.  I estimated it to be an out of pocket expense of approximately $1500.00.  06-20-11

## 2011-06-28 ENCOUNTER — Encounter: Payer: Self-pay | Admitting: *Deleted

## 2011-07-10 ENCOUNTER — Encounter: Payer: Self-pay | Admitting: *Deleted

## 2011-07-16 ENCOUNTER — Encounter: Payer: Self-pay | Admitting: Radiation Oncology

## 2011-07-16 ENCOUNTER — Ambulatory Visit
Admission: RE | Admit: 2011-07-16 | Discharge: 2011-07-16 | Disposition: A | Discharge status: Home / Self Care | Payer: Medicare Other | Source: Ambulatory Visit | Attending: Radiation Oncology | Admitting: Radiation Oncology

## 2011-07-16 ENCOUNTER — Ambulatory Visit: Payer: Medicare Other | Admitting: Radiation Oncology

## 2011-07-16 VITALS — BP 131/79 | HR 87 | Temp 97.0°F | Resp 20 | Wt 108.8 lb

## 2011-07-16 DIAGNOSIS — C50919 Malignant neoplasm of unspecified site of unspecified female breast: Secondary | ICD-10-CM

## 2011-07-16 NOTE — Progress Notes (Signed)
Pt here for f/u appt, occasional sharp pian in r chest wall, has congested cough, started 1 week ago,not taking anything foe trhis,dry, no other c/o, still discoloration  Right chest wall,well healed

## 2011-07-17 NOTE — Progress Notes (Signed)
CC:   Exie Parody, M.D. Juanetta Gosling, MD  DIAGNOSIS:  Locally advanced right breast cancer.  INTERVAL SINCE RADIATION THERAPY:  7 months.  NARRATIVE:  Rachel Mcbride comes in today for routine followup.  She clinically seems to be doing well.  She continues on hormonal therapy through Dr. Lodema Pilot office.  The patient denies any consistent pain along the right chest wall area.  She occasional will have a sharp shooting pain that lasts for just a second.  The patient denies any problems with her breathing at this time.  She denies any hemoptysis.  The patient denies any new bony pain, headaches, dizziness or blurred vision.  EXAMINATION:  Vital signs:  The patient's weight is 108 pounds. Temperature is 97, pulse 87, blood pressure 131/79.  Neck:  Examination of the neck and supraclavicular region reveals no evidence of adenopathy.  The axillary areas are free of adenopathy.  Lungs: Examination of the lungs reveals them to be clear.  Heart:  The heart has a regular rhythm and rate.  Breasts:  Examination of the left breast reveals no mass or nipple discharge.  Examination of right chest wall area reveals hyperpigmentation changes.  There is no palpable or visible signs of recurrence.  IMPRESSION AND PLAN:  Clinically no evidence of disease.  In light of the patient's close followup with Dr. Gaylyn Rong, I have not scheduled Ms. Fleig for formal follow-up appointment but would be glad to see her at any time.    ______________________________ Billie Lade, Ph.D., M.D. JDK/MEDQ  D:  07/16/2011  T:  07/17/2011  Job:  [id number]

## 2011-07-20 ENCOUNTER — Encounter: Payer: Self-pay | Admitting: *Deleted

## 2011-08-02 ENCOUNTER — Ambulatory Visit (HOSPITAL_BASED_OUTPATIENT_CLINIC_OR_DEPARTMENT_OTHER): Payer: Medicare Other | Admitting: Oncology

## 2011-08-02 ENCOUNTER — Telehealth: Payer: Self-pay | Admitting: Cardiovascular Disease

## 2011-08-02 ENCOUNTER — Other Ambulatory Visit (HOSPITAL_BASED_OUTPATIENT_CLINIC_OR_DEPARTMENT_OTHER): Payer: Medicare Other | Admitting: Lab

## 2011-08-02 VITALS — BP 120/64 | HR 73 | Temp 96.4°F | Ht 64.5 in | Wt 107.7 lb

## 2011-08-02 DIAGNOSIS — Z853 Personal history of malignant neoplasm of breast: Secondary | ICD-10-CM

## 2011-08-02 DIAGNOSIS — C50919 Malignant neoplasm of unspecified site of unspecified female breast: Secondary | ICD-10-CM

## 2011-08-02 DIAGNOSIS — I10 Essential (primary) hypertension: Secondary | ICD-10-CM

## 2011-08-02 DIAGNOSIS — M810 Age-related osteoporosis without current pathological fracture: Secondary | ICD-10-CM

## 2011-08-02 LAB — CBC WITH DIFFERENTIAL/PLATELET
Basophils Absolute: 0 10*3/uL (ref 0.0–0.1)
EOS%: 6.4 % (ref 0.0–7.0)
HGB: 11.6 g/dL (ref 11.6–15.9)
MCH: 31.7 pg (ref 25.1–34.0)
MCHC: 33.3 g/dL (ref 31.5–36.0)
MCV: 95.2 fL (ref 79.5–101.0)
MONO%: 11.2 % (ref 0.0–14.0)
RDW: 12.9 % (ref 11.2–14.5)

## 2011-08-02 LAB — COMPREHENSIVE METABOLIC PANEL
AST: 17 U/L (ref 0–37)
Albumin: 4.4 g/dL (ref 3.5–5.2)
Alkaline Phosphatase: 40 U/L (ref 39–117)
BUN: 22 mg/dL (ref 6–23)
Creatinine, Ser: 0.9 mg/dL (ref 0.50–1.10)
Potassium: 4.1 mEq/L (ref 3.5–5.3)

## 2011-08-02 NOTE — Progress Notes (Signed)
[facility] OFFICE PROGRESS NOTE  Cc:  Kaleen Mask, MD   DIAGNOSES:   1. A pT2pN2aMX (stage IIIA) invasive ductal carcinoma measuring 2.4 cm, grade 2 out of 3, status post right mastectomy with axillary lymph node dissection on April 20, 2010, with negative surgical margins; 6 out of 17 lymph nodes positive, with extracapsular extension.  There was lymphovascular invasion.  ER 81%, PR 64%, Ki-67 of 16%, HER-2/neu by CISH was negative ratio of 1.46.   2. Subcarinal adenopathy positive on PET scan in 03/2010; however, EBUS FNA was negative.  Follow up CT chest in 07/2010 showed stable calcified subcarinal node.  She has been followed by Dr. Vassie Loll.     PAST THERAPY:  s/p adjuvant chemotherapy with dose dense AC followed by weekly Taxol finished on 09/21/2010.  She terminated Taxol prematurely due to neuropathy, fatigue.    CURRENT THERAPY:  she was supposed to start Arimidex May 2012.  INTERVAL HISTORY: Rachel Mcbride 65 y.o. female returns to clinic today with her husband.  She reports that she had not been adherent with physical/occupation therapy.  She was supposed to do stretching exercises; however, she has not been doing this.  She has noticed that when she raises her right arm, the right mastectomy wound stretches and she has pain in the tricep.  She thus cannot raise her right arm above her shoulder.  She denies neck pain, left arm pain, or shoulder pain.  She takes Arimidex with mild myalgia/arthralgia.  She denies hot flash/night sweat.  She denies any right mastectomy scar lesion or left breast lesion.  She only take smokeless cigarette .  She denies SOB, DOE, fatigue, anorexia, significant weight loss, bleeding symptoms, edema. She has had multiple Christmas parties over the last few weeks for her friend and family without severe fatigue.   Patient denies fatigue, headache, visual changes, confusion, drenching night sweats, palpable lymph node swelling, mucositis,  odynophagia, dysphagia, nausea vomiting, jaundice, chest pain, palpitation, shortness of breath, dyspnea on exertion, productive cough, gum bleeding, epistaxis, hematemesis, hemoptysis, abdominal pain, abdominal swelling, early satiety, melena, hematochezia, hematuria, skin rash, spontaneous bleeding, joint swelling, joint pain, heat or cold intolerance, bowel bladder incontinence, back pain, paresthesia, depression, suicidal or homocidal ideation, feeling hopelessness.   MEDICAL HISTORY: Past Medical History  Diagnosis Date  . Breast cancer   . Neutropenia   . Pneumonia   . Atrial flutter   . Hypoxemia   . CHF (congestive heart failure)     DIASTOLIC  . COPD (chronic obstructive pulmonary disease)   . TIA (transient ischemic attack)     SURGICAL HISTORY: No past surgical history on file.  MEDICATIONS: Current Outpatient Prescriptions  Medication Sig Dispense Refill  . alendronate (FOSAMAX) 35 MG tablet Take 1 tablet by mouth Once a week.      Marland Kitchen anastrozole (ARIMIDEX) 1 MG tablet Take 1 tablet by mouth daily.      Marland Kitchen aspirin 81 MG tablet Take 81 mg by mouth daily.        . Calcium Carbonate (CALCIUM 600 PO) Take by mouth 2 (two) times daily.       . Cholecalciferol (VITAMIN D PO) Take 1,000 Units by mouth daily.        . furosemide (LASIX) 20 MG tablet Take 20 mg by mouth 2 (two) times daily.        Marland Kitchen lisinopril (PRINIVIL,ZESTRIL) 10 MG tablet Take 1 tablet (10 mg total) by mouth daily.  30 tablet  5  .  Albuterol Sulfate (PROAIR HFA IN) Inhale into the lungs as needed.        . digoxin (LANOXIN) 0.125 MG tablet Take 125 mcg by mouth daily.        Marland Kitchen diltiazem (CARDIZEM CD) 120 MG 24 hr capsule Take 120 mg by mouth daily.        . predniSONE (DELTASONE) 20 MG tablet Take 20 mg by mouth daily.          ALLERGIES:  is allergic to pneumococcal vaccines and shellfish-derived products.  REVIEW OF SYSTEMS:  The rest of the 14-point review of system was negative.   Filed Vitals:    08/02/11 0855  BP: 120/64  Pulse: 73  Temp: 96.4 F (35.8 C)   Wt Readings from Last 3 Encounters:  08/02/11 107 lb 11.2 oz (48.852 kg)  07/16/11 108 lb 12.8 oz (49.351 kg)  05/30/11 103 lb 12.8 oz (47.083 kg)   ECOG Performance status: 0-1  PHYSICAL EXAMINATION:   General:  well-nourished in no acute distress.  Eyes:  no scleral icterus.  ENT:  There were no oropharyngeal lesions.  Neck was without thyromegaly.  Lymphatics:  Negative cervical, supraclavicular or axillary adenopathy.  Respiratory: lungs were clear bilaterally without wheezing or crackles.  Cardiovascular:  Regular rate and rhythm, S1/S2, without murmur, rub or gallop.  There was no pedal edema.  GI:  abdomen was soft, flat, nontender, nondistended, without organomegaly.  Muscoloskeletal:  no spinal tenderness of palpation of vertebral spine.  There was no tenderness to palpation of her right shoulder.  Skin exam was without echymosis, petichae.  Neuro exam notable for her ability to raise her right arm only to her shoulder level.    Patient was able to get on and off exam table without assistance.  Gait was normal.  Patient was alerted and oriented.  Attention was good.   Language was appropriate.  Mood was normal without depression.  Speech was not pressured.  Thought content was not tangential.  Right mastectomy scar was well healed without lesions.  Left breast exam was negative.    LABORATORY/RADIOLOGY DATA:  Lab Results  Component Value Date   WBC 4.0 08/02/2011   HGB 11.6 08/02/2011   HCT 34.7* 08/02/2011   PLT 223 08/02/2011   GLUCOSE 84 04/12/2011   ALT <8 04/12/2011   AST 18 04/12/2011   NA 136 04/12/2011   K 4.2 04/12/2011   CL 99 04/12/2011   CREATININE 0.77 04/12/2011   BUN 13 04/12/2011   CO2 28 04/12/2011   TSH 1.299 07/14/2010   INR 1.12 07/15/2010   ASSESSMENT AND PLAN:   1. History of breast cancer.  She is doing well on adjuvant Arimidex.  She has no evidence of local or distal met.  I advised her to  continue adjuvant Arimidex for 5 years total.  2. Right arm tightness:  2/2 lack of exercise.  On clinical history and exam, her symptoms are more consistent with stretching/pulling of her mastectomy scar as opposed to bone met.  She had previously attended PT/OT sessions and remembers how to do these exercises.  She promised to be more adherent.  3. Osteopenia:  Fosamax 35mg  PO weekly and Cal/Vit D bid.  4. History of atrial fibrillation.  She is on diltiazem and digoxin per cardiologist. 5. Hypertension, well-controlled on lisinopril, diltizem per PCP.  6. History of congestive heart failure.  She is euvolemic at this time.  She is on lisinopril and Lasix per PCP. 7. History of  smoking.  She is only using electronic smokless cigarettes at this time.  8. Subcarinal node on PET pre therapy for breast cancer:  f/u chest CT in 07/2010 showed stable calcified subcarinal node.  I defer to Dr. Vassie Loll to see if further surveillance chest CT is indicated since it was biopsied and was negative with a  stable follow up chest CT. 9. Portacath access:  It's almost one year that she's finished chemotherapy.  I referred her back to Dr. Dwain Sarna to see if he would remove her portacath since she lives quite [place] way from the [facility]; and an indwelling portacath would require flushing every 6 wks. 10. Surveillance:  Next mammogram and bone density are due in August 2013 (to be scheduled when she is here next time).   Follow up with me in about 5 months.

## 2011-08-02 NOTE — Telephone Encounter (Signed)
New message;  Pt has a question about the medication she should take the morning of her Lexiscan.  Please call back and advise.

## 2011-08-02 NOTE — Telephone Encounter (Signed)
Told to hold lasix that morning

## 2011-08-03 ENCOUNTER — Other Ambulatory Visit (INDEPENDENT_AMBULATORY_CARE_PROVIDER_SITE_OTHER): Payer: Self-pay | Admitting: General Surgery

## 2011-08-16 ENCOUNTER — Telehealth: Payer: Self-pay | Admitting: Pulmonary Disease

## 2011-08-16 NOTE — Telephone Encounter (Signed)
Pt has been changed from 1/15 to 1/17 at 1:30 on RA's schedule.

## 2011-08-16 NOTE — Telephone Encounter (Signed)
[email or web address]  

## 2011-08-16 NOTE — Telephone Encounter (Signed)
Rachel Mcbride, does this pt's appt need to be changed?

## 2011-08-20 ENCOUNTER — Other Ambulatory Visit: Payer: Self-pay | Admitting: *Deleted

## 2011-08-20 ENCOUNTER — Telehealth: Payer: Self-pay | Admitting: Cardiovascular Disease

## 2011-08-20 ENCOUNTER — Ambulatory Visit (HOSPITAL_COMMUNITY): Payer: Medicare Other | Attending: Cardiology | Admitting: Radiology

## 2011-08-20 VITALS — BP 98/49 | Ht 64.0 in | Wt 107.0 lb

## 2011-08-20 DIAGNOSIS — I4892 Unspecified atrial flutter: Secondary | ICD-10-CM | POA: Insufficient documentation

## 2011-08-20 DIAGNOSIS — Z87891 Personal history of nicotine dependence: Secondary | ICD-10-CM | POA: Insufficient documentation

## 2011-08-20 DIAGNOSIS — R0609 Other forms of dyspnea: Secondary | ICD-10-CM | POA: Diagnosis not present

## 2011-08-20 DIAGNOSIS — R0602 Shortness of breath: Secondary | ICD-10-CM

## 2011-08-20 DIAGNOSIS — I4949 Other premature depolarization: Secondary | ICD-10-CM

## 2011-08-20 DIAGNOSIS — Z8249 Family history of ischemic heart disease and other diseases of the circulatory system: Secondary | ICD-10-CM | POA: Insufficient documentation

## 2011-08-20 DIAGNOSIS — R002 Palpitations: Secondary | ICD-10-CM | POA: Diagnosis not present

## 2011-08-20 DIAGNOSIS — I779 Disorder of arteries and arterioles, unspecified: Secondary | ICD-10-CM | POA: Insufficient documentation

## 2011-08-20 DIAGNOSIS — J449 Chronic obstructive pulmonary disease, unspecified: Secondary | ICD-10-CM | POA: Diagnosis not present

## 2011-08-20 DIAGNOSIS — I509 Heart failure, unspecified: Secondary | ICD-10-CM | POA: Diagnosis not present

## 2011-08-20 DIAGNOSIS — I10 Essential (primary) hypertension: Secondary | ICD-10-CM | POA: Insufficient documentation

## 2011-08-20 DIAGNOSIS — R5383 Other fatigue: Secondary | ICD-10-CM | POA: Insufficient documentation

## 2011-08-20 DIAGNOSIS — R5381 Other malaise: Secondary | ICD-10-CM | POA: Insufficient documentation

## 2011-08-20 DIAGNOSIS — J4489 Other specified chronic obstructive pulmonary disease: Secondary | ICD-10-CM | POA: Insufficient documentation

## 2011-08-20 DIAGNOSIS — R0989 Other specified symptoms and signs involving the circulatory and respiratory systems: Secondary | ICD-10-CM | POA: Insufficient documentation

## 2011-08-20 MED ORDER — REGADENOSON 0.4 MG/5ML IV SOLN
0.4000 mg | Freq: Once | INTRAVENOUS | Status: AC
  Administered 2011-08-20: 0.4 mg via INTRAVENOUS

## 2011-08-20 MED ORDER — TECHNETIUM TC 99M TETROFOSMIN IV KIT
10.0000 | PACK | Freq: Once | INTRAVENOUS | Status: AC | PRN
  Administered 2011-08-20: 10 via INTRAVENOUS

## 2011-08-20 MED ORDER — FUROSEMIDE 20 MG PO TABS: 20.0000 mg | ORAL_TABLET | Freq: Two times a day (BID) | ORAL | Status: DC

## 2011-08-20 MED ORDER — TECHNETIUM TC 99M TETROFOSMIN IV KIT
30.0000 | PACK | Freq: Once | INTRAVENOUS | Status: AC | PRN
  Administered 2011-08-20: 30 via INTRAVENOUS

## 2011-08-20 NOTE — Telephone Encounter (Signed)
Pt almost out of lasix, has no more refills, does dr Elease Hashimoto wants her to continue? [facility] [phone]

## 2011-08-20 NOTE — Progress Notes (Signed)
[facility] SITE 3 NUCLEAR MED [address] [place] [address] [phone]  Cardiology Nuclear Med Study  Rachel Mcbride is a 66 y.o. female [id number] 09-24-1945   Nuclear Med Background Indication for Stress Test:  Evaluation for Ischemia History:  COPD,07/2010 Echo: EF 45-50%, History of Chemo: and radiation TX and AFIB,CHF,(R) Breast CA Cardiac Risk Factors: Carotid Disease, Family History - CAD, History of Smoking and Hypertension  Symptoms:  DOE, Fatigue, Palpitations and SOB   Nuclear Pre-Procedure Caffeine/Decaff Intake:  None NPO After: 9:00pm   Lungs: clear IV 0.9% NS with Angio Cath:  22g  IV Site: L Forearm  IV Started by:  Stanton Kidney, EMT-P  Chest Size (in):  34 Cup Size: B  Height: 5\' 4"  (1.626 m)  Weight:  107 lb (48.535 kg)  BMI:  Body mass index is 18.37 kg/(m^2). Tech Comments:  NA    Nuclear Med Study 1 or 2 day study: 1 day  Stress Test Type:  Eugenie Birks  Reading MD: Olga Millers, MD  Order Authorizing Provider:  P.Nahser  Resting Radionuclide: Technetium 93m Tetrofosmin  Resting Radionuclide Dose: 11.0 mCi   Stress Radionuclide:  Technetium 30m Tetrofosmin  Stress Radionuclide Dose: 33.0 mCi           Stress Protocol Rest HR: 65 Stress HR: 97  Rest BP: 98/49 Stress BP: 100/44  Exercise Time (min): n/a METS: n/a   Predicted Max HR: 154 bpm % Max HR: 62.99 bpm Rate Pressure Product: 9700   Dose of Adenosine (mg):  n/a Dose of Lexiscan: 0.4 mg  Dose of Atropine (mg): n/a Dose of Dobutamine: n/a mcg/kg/min (at max HR)  Stress Test Technologist: Milana Na, EMT-P  Nuclear Technologist:  Domenic Polite, CNMT     Rest Procedure:  Myocardial perfusion imaging was performed at rest 45 minutes following the intravenous administration of Technetium 46m Tetrofosmin. Rest ECG: NSR  Stress Procedure:  The patient received IV Lexiscan 0.4 mg over 15-seconds.  Technetium 46m Tetrofosmin injected at 30-seconds.  There were no  significant changes with Lexiscan.  Quantitative spect images were obtained after a 45 minute delay. Stress ECG: No significant ST segment change suggestive of ischemia.  QPS Raw Data Images:  Acquisition technically good; normal left ventricular size. Stress Images:  Normal homogeneous uptake in all areas of the myocardium. Rest Images:  Normal homogeneous uptake in all areas of the myocardium. Subtraction (SDS):  No evidence of ischemia. Transient Ischemic Dilatation (Normal <1.22):  1.09 Lung/Heart Ratio (Normal <0.45):  0.37  Quantitative Gated Spect Images QGS EDV:  62 ml QGS ESV:  19 ml QGS cine images:  NL LV Function; NL Wall Motion QGS EF: 69%  Impression Exercise Capacity:  Lexiscan with no exercise. BP Response:  Normal blood pressure response. Clinical Symptoms:  No chest pain. ECG Impression:  No significant ST segment change suggestive of ischemia. Comparison with Prior Nuclear Study: No images to compare  Overall Impression:  Normal stress nuclear study.   Olga Millers

## 2011-08-20 NOTE — Telephone Encounter (Signed)
Pt confirms she takes med bid, refill completed

## 2011-08-28 ENCOUNTER — Ambulatory Visit: Payer: Medicare Other | Admitting: Pulmonary Disease

## 2011-08-30 ENCOUNTER — Encounter: Payer: Self-pay | Admitting: Pulmonary Disease

## 2011-08-30 ENCOUNTER — Ambulatory Visit (INDEPENDENT_AMBULATORY_CARE_PROVIDER_SITE_OTHER): Payer: Medicare Other | Admitting: Pulmonary Disease

## 2011-08-30 VITALS — BP 130/70 | HR 75 | Temp 97.9°F | Ht 64.5 in | Wt 110.2 lb

## 2011-08-30 DIAGNOSIS — J984 Other disorders of lung: Secondary | ICD-10-CM

## 2011-08-30 DIAGNOSIS — J449 Chronic obstructive pulmonary disease, unspecified: Secondary | ICD-10-CM

## 2011-08-30 NOTE — Patient Instructions (Signed)
CT scan of chest to follow up on new nodule

## 2011-08-30 NOTE — Assessment & Plan Note (Signed)
Has quit smoking No SABA use, observe for now

## 2011-08-30 NOTE — Progress Notes (Signed)
  Subjective:    Patient ID: Rachel Mcbride, female    DOB: 1946/01/21, 66 y.o.   MRN: [id number]  HPI [name] , ex- heavy smoker > 50 Pyrs, for FU of COPD & subcarinal lymphadenopathy. Dec 2011 Spirometry FEV1 at 55%, ratio at 74. Previous FEV1 8/11 at 43%.  She presented with a breast mass in 8/11, diagnosed asT2 N1 Mx lobular breast ca with right axillary LN involvement. PET-CT showed negative right cervical Ln, negative 5 mm & 10 mm nodules in the lung & 9x 14 mm subcarinal Ln with malignant range uptake although this showed a hint of calcification on the CT images - EBUS neg.  FU CT dec'11 >> no nodules, LN unchanged  Underwent MRM, LNs pos -->ER , PR pos, Her-2 neu neg  Pneumovax 04/03/10  Admitted 12/1-12/8 for RML PNA. During her stay she developed a-flutter with cardilogy consult (Nahser). Echo showed EF 45-50%, mild LV reduction , BNP was elevated at 755.   02/15/11  Treated for pna in 3/12, has completed RT  Has quit smoking & feels well, no dyspnea or wheeze or cough  CT >>Lymph nodes not enlarged. New 5 mm nodule in RUL   08/30/2011 Stress test nml - Breathing ok, Remains off cigs Denies chest pain, orthopnea, hemoptysis, fever, n/v/d, headache.    Review of Systems Patient denies significant dyspnea,cough, hemoptysis,  chest pain, palpitations, pedal edema, orthopnea, paroxysmal nocturnal dyspnea, lightheadedness, nausea, vomiting, abdominal or  leg pains      Objective:   Physical Exam  Gen. Pleasant, well-nourished, in no distress ENT - no lesions, no post nasal drip Neck: No JVD, no thyromegaly, no carotid bruits Lungs: no use of accessory muscles, no dullness to percussion, clear without rales or rhonchi  Cardiovascular: Rhythm regular, heart sounds  normal, no murmurs or gallops, no peripheral edema Musculoskeletal: No deformities, no cyanosis or clubbing        Assessment & Plan:

## 2011-08-30 NOTE — Assessment & Plan Note (Signed)
New RUL nodule 5 mm 7/12 Will need FU CT , will use contrast to image LNs at the same time

## 2011-08-31 ENCOUNTER — Encounter (HOSPITAL_BASED_OUTPATIENT_CLINIC_OR_DEPARTMENT_OTHER): Payer: Self-pay | Admitting: *Deleted

## 2011-08-31 ENCOUNTER — Other Ambulatory Visit: Payer: Self-pay | Admitting: *Deleted

## 2011-08-31 DIAGNOSIS — R059 Cough, unspecified: Secondary | ICD-10-CM

## 2011-08-31 HISTORY — DX: Cough, unspecified: R05.9

## 2011-08-31 MED ORDER — ANASTROZOLE 1 MG PO TABS: 1.0000 mg | ORAL_TABLET | Freq: Every day | ORAL | Status: DC

## 2011-08-31 NOTE — Telephone Encounter (Signed)
Anastrozole 1mg  po daily, #90, 3refills

## 2011-08-31 NOTE — Telephone Encounter (Signed)
Addended by: Wende Mott on: 08/31/2011 03:31 PM   Modules accepted: Orders

## 2011-09-03 ENCOUNTER — Encounter (HOSPITAL_BASED_OUTPATIENT_CLINIC_OR_DEPARTMENT_OTHER)
Admission: RE | Admit: 2011-09-03 | Discharge: 2011-09-03 | Disposition: A | Discharge status: Home / Self Care | Payer: Medicare Other | Source: Ambulatory Visit | Attending: General Surgery | Admitting: General Surgery

## 2011-09-03 DIAGNOSIS — C50919 Malignant neoplasm of unspecified site of unspecified female breast: Secondary | ICD-10-CM | POA: Diagnosis not present

## 2011-09-03 DIAGNOSIS — I509 Heart failure, unspecified: Secondary | ICD-10-CM | POA: Diagnosis not present

## 2011-09-03 DIAGNOSIS — J449 Chronic obstructive pulmonary disease, unspecified: Secondary | ICD-10-CM | POA: Diagnosis not present

## 2011-09-03 DIAGNOSIS — Z01812 Encounter for preprocedural laboratory examination: Secondary | ICD-10-CM | POA: Diagnosis not present

## 2011-09-03 DIAGNOSIS — I10 Essential (primary) hypertension: Secondary | ICD-10-CM | POA: Diagnosis not present

## 2011-09-03 DIAGNOSIS — Z452 Encounter for adjustment and management of vascular access device: Secondary | ICD-10-CM | POA: Diagnosis not present

## 2011-09-03 DIAGNOSIS — I4891 Unspecified atrial fibrillation: Secondary | ICD-10-CM | POA: Diagnosis not present

## 2011-09-03 LAB — BASIC METABOLIC PANEL
BUN: 16 mg/dL (ref 6–23)
Calcium: 9.8 mg/dL (ref 8.4–10.5)
Creatinine, Ser: 0.75 mg/dL (ref 0.50–1.10)
GFR calc non Af Amer: 86 mL/min — ABNORMAL LOW (ref >90)
Glucose, Bld: 86 mg/dL (ref 70–99)
Potassium: 4.3 mEq/L (ref 3.5–5.1)

## 2011-09-03 NOTE — Pre-Procedure Instructions (Signed)
Discussed hx. with Dr. Gypsy Balsam - no new CXR needed for this procedure.

## 2011-09-05 ENCOUNTER — Encounter (HOSPITAL_BASED_OUTPATIENT_CLINIC_OR_DEPARTMENT_OTHER)
Admission: RE | Disposition: A | Discharge status: Home / Self Care | Payer: Self-pay | Source: Ambulatory Visit | Attending: General Surgery

## 2011-09-05 ENCOUNTER — Encounter (HOSPITAL_BASED_OUTPATIENT_CLINIC_OR_DEPARTMENT_OTHER): Payer: Self-pay | Admitting: Anesthesiology

## 2011-09-05 ENCOUNTER — Encounter (HOSPITAL_BASED_OUTPATIENT_CLINIC_OR_DEPARTMENT_OTHER): Payer: Self-pay | Admitting: *Deleted

## 2011-09-05 ENCOUNTER — Ambulatory Visit (HOSPITAL_BASED_OUTPATIENT_CLINIC_OR_DEPARTMENT_OTHER)
Admission: RE | Admit: 2011-09-05 | Discharge: 2011-09-05 | Disposition: A | Discharge status: Home / Self Care | Payer: Medicare Other | Source: Ambulatory Visit | Attending: General Surgery | Admitting: General Surgery

## 2011-09-05 ENCOUNTER — Ambulatory Visit (HOSPITAL_BASED_OUTPATIENT_CLINIC_OR_DEPARTMENT_OTHER): Payer: Medicare Other | Admitting: Anesthesiology

## 2011-09-05 DIAGNOSIS — I10 Essential (primary) hypertension: Secondary | ICD-10-CM | POA: Insufficient documentation

## 2011-09-05 DIAGNOSIS — I509 Heart failure, unspecified: Secondary | ICD-10-CM | POA: Insufficient documentation

## 2011-09-05 DIAGNOSIS — Z452 Encounter for adjustment and management of vascular access device: Secondary | ICD-10-CM | POA: Insufficient documentation

## 2011-09-05 DIAGNOSIS — J4489 Other specified chronic obstructive pulmonary disease: Secondary | ICD-10-CM | POA: Insufficient documentation

## 2011-09-05 DIAGNOSIS — J449 Chronic obstructive pulmonary disease, unspecified: Secondary | ICD-10-CM | POA: Insufficient documentation

## 2011-09-05 DIAGNOSIS — C50919 Malignant neoplasm of unspecified site of unspecified female breast: Secondary | ICD-10-CM | POA: Insufficient documentation

## 2011-09-05 DIAGNOSIS — Z01812 Encounter for preprocedural laboratory examination: Secondary | ICD-10-CM | POA: Insufficient documentation

## 2011-09-05 DIAGNOSIS — I4891 Unspecified atrial fibrillation: Secondary | ICD-10-CM | POA: Insufficient documentation

## 2011-09-05 HISTORY — DX: Cough: R05

## 2011-09-05 HISTORY — PX: PORT-A-CATH REMOVAL: SHX5289

## 2011-09-05 HISTORY — DX: Scoliosis, unspecified: M41.9

## 2011-09-05 HISTORY — DX: Unspecified mononeuropathy of unspecified lower limb: G57.90

## 2011-09-05 HISTORY — DX: Essential (primary) hypertension: I10

## 2011-09-05 SURGERY — REMOVAL PORT-A-CATH
Anesthesia: Monitor Anesthesia Care | Site: Chest | Laterality: Left | Wound class: Clean

## 2011-09-05 MED ORDER — MIDAZOLAM HCL 5 MG/5ML IJ SOLN
INTRAMUSCULAR | Status: DC | PRN
  Administered 2011-09-05: 1 mg via INTRAVENOUS

## 2011-09-05 MED ORDER — PROPOFOL 10 MG/ML IV EMUL
INTRAVENOUS | Status: DC | PRN
  Administered 2011-09-05: 20 mg via INTRAVENOUS
  Administered 2011-09-05: 10 mg via INTRAVENOUS

## 2011-09-05 MED ORDER — OXYCODONE-ACETAMINOPHEN 5-325 MG PO TABS: 1.0000 | ORAL_TABLET | ORAL | Status: AC | PRN

## 2011-09-05 MED ORDER — FENTANYL CITRATE 0.05 MG/ML IJ SOLN
INTRAMUSCULAR | Status: DC | PRN
  Administered 2011-09-05: 50 ug via INTRAVENOUS

## 2011-09-05 MED ORDER — ONDANSETRON HCL 4 MG/2ML IJ SOLN
INTRAMUSCULAR | Status: DC | PRN
  Administered 2011-09-05: 4 mg via INTRAVENOUS

## 2011-09-05 MED ORDER — LACTATED RINGERS IV SOLN
INTRAVENOUS | Status: DC
  Administered 2011-09-05: 09:00:00 via INTRAVENOUS

## 2011-09-05 MED ORDER — LIDOCAINE-EPINEPHRINE (PF) 1 %-1:200000 IJ SOLN
INTRAMUSCULAR | Status: DC | PRN
  Administered 2011-09-05: 7 mL

## 2011-09-05 SURGICAL SUPPLY — 26 items
BLADE SURG 15 STRL LF DISP TIS (BLADE) ×1 IMPLANT
BLADE SURG 15 STRL SS (BLADE) ×1
CHLORAPREP W/TINT 26ML (MISCELLANEOUS) ×2 IMPLANT
CLOTH BEACON ORANGE TIMEOUT ST (SAFETY) ×2 IMPLANT
COVER MAYO STAND STRL (DRAPES) ×2 IMPLANT
COVER TABLE BACK 60X90 (DRAPES) ×2 IMPLANT
DECANTER SPIKE VIAL GLASS SM (MISCELLANEOUS) IMPLANT
DERMABOND ADVANCED (GAUZE/BANDAGES/DRESSINGS) ×1
DERMABOND ADVANCED .7 DNX12 (GAUZE/BANDAGES/DRESSINGS) ×1 IMPLANT
DRAPE PED LAPAROTOMY (DRAPES) ×2 IMPLANT
ELECT COATED BLADE 2.86 ST (ELECTRODE) ×2 IMPLANT
ELECT REM PT RETURN 9FT ADLT (ELECTROSURGICAL) ×2
ELECTRODE REM PT RTRN 9FT ADLT (ELECTROSURGICAL) ×1 IMPLANT
GLOVE BIO SURGEON STRL SZ7 (GLOVE) ×6 IMPLANT
GOWN PREVENTION PLUS XLARGE (GOWN DISPOSABLE) ×6 IMPLANT
NEEDLE HYPO 25X1 1.5 SAFETY (NEEDLE) ×2 IMPLANT
PACK BASIN DAY SURGERY FS (CUSTOM PROCEDURE TRAY) ×2 IMPLANT
PENCIL BUTTON HOLSTER BLD 10FT (ELECTRODE) ×2 IMPLANT
SLEEVE SCD COMPRESS KNEE MED (MISCELLANEOUS) IMPLANT
SUT MON AB 4-0 PC3 18 (SUTURE) ×2 IMPLANT
SUT VIC AB 3-0 SH 27 (SUTURE) ×1
SUT VIC AB 3-0 SH 27X BRD (SUTURE) ×1 IMPLANT
SYR CONTROL 10ML LL (SYRINGE) ×2 IMPLANT
TOWEL OR 17X24 6PK STRL BLUE (TOWEL DISPOSABLE) ×2 IMPLANT
TOWEL OR NON WOVEN STRL DISP B (DISPOSABLE) ×2 IMPLANT
WATER STERILE IRR 1000ML POUR (IV SOLUTION) IMPLANT

## 2011-09-05 NOTE — Anesthesia Preprocedure Evaluation (Addendum)
Anesthesia Evaluation  Patient identified by MRN, date of birth, ID band Patient awake    Reviewed: Allergy & Precautions, H&P , NPO status , Patient's Chart, lab work & pertinent test results, reviewed documented beta blocker date and time   Airway Mallampati: II TM Distance: >3 FB Neck ROM: full    Dental   Pulmonary pneumonia , COPD         Cardiovascular hypertension, Pt. on medications and On Medications +CHF + dysrhythmias Atrial Fibrillation     Neuro/Psych Negative Neurological ROS  Negative Psych ROS   GI/Hepatic negative GI ROS, Neg liver ROS,   Endo/Other  Negative Endocrine ROS  Renal/GU negative Renal ROS  Genitourinary negative   Musculoskeletal   Abdominal   Peds  Hematology negative hematology ROS (+)   Anesthesia Other Findings See surgeon's H&P   Reproductive/Obstetrics negative OB ROS                           Anesthesia Physical Anesthesia Plan  ASA: III  Anesthesia Plan: MAC   Post-op Pain Management:    Induction: Intravenous  Airway Management Planned: Simple Face Mask  Additional Equipment:   Intra-op Plan:   Post-operative Plan:   Informed Consent: I have reviewed the patients History and Physical, chart, labs and discussed the procedure including the risks, benefits and alternatives for the proposed anesthesia with the patient or authorized representative who has indicated his/her understanding and acceptance.     Plan Discussed with: CRNA and Surgeon  Anesthesia Plan Comments:        Anesthesia Quick Evaluation

## 2011-09-05 NOTE — H&P (Signed)
Rachel Mcbride is an 66 y.o. female.   Chief Complaint: port no longer needs venous access HPI: 3 yof s/p treatement for locally advanced breast cancer now no longer needs venous access.  We discussed removal of port.  Past Medical History  Diagnosis Date  . Breast cancer 04/2010  . Hypertension     under control; has been on med. x 2 yrs.  . CHF (congestive heart failure) 2011  . Atrial flutter 2011    occurred with CHF; no current dysrhythmia  . COPD (chronic obstructive pulmonary disease)     denies SOB with daily activities  . Pneumonia 2011  . Scoliosis     since chemo  . Neuropathy of leg     since chemo; right leg  . Cough 08/31/2011    Past Surgical History  Procedure Date  . Portacath placement 05/17/2010  . Mastectomy 04/20/2010    right  . Bronchoscopy 04/03/2010    with endobronchial ultrasound    Family History  Problem Relation Age of Onset  . Heart attack Mother   . Stroke Mother    Social History:  reports that she quit smoking about 13 months ago. Her smoking use included Cigarettes. She has never used smokeless tobacco. She reports that she drinks alcohol. She reports that she uses illicit drugs (Methylphenidate).  Allergies:  Allergies  Allergen Reactions  . Pneumococcal Vaccines Itching  . Shellfish-Derived Products Itching    Medications Prior to Admission  Medication Dose Route Frequency Provider Last Rate Last Dose  . lactated ringers infusion   Intravenous Continuous Zenon Mayo, MD 20 mL/hr at 09/05/11 [phone]     Medications Prior to Admission  Medication Sig Dispense Refill  . alendronate (FOSAMAX) 35 MG tablet Take 1 tablet by mouth Once a week. Takes on Wednesday      . aspirin 81 MG tablet Take 81 mg by mouth daily.       . Calcium Carbonate (CALCIUM 600 PO) Take by mouth 2 (two) times daily.       . Cholecalciferol (VITAMIN D PO) Take 1,000 Units by mouth daily.       Marland Kitchen lisinopril (PRINIVIL,ZESTRIL) 10 MG tablet Take 1 tablet (10  mg total) by mouth daily.  30 tablet  5  . Albuterol Sulfate (PROAIR HFA IN) Inhale into the lungs as needed.          Results for orders placed during the hospital encounter of 09/05/11 (from the past 48 hour(s))  BASIC METABOLIC PANEL     Status: Abnormal   Collection Time   09/03/11 10:50 AM      Component Value Range Comment   Sodium 137  135 - 145 (mEq/L)    Potassium 4.3  3.5 - 5.1 (mEq/L)    Chloride 99  96 - 112 (mEq/L)    CO2 30  19 - 32 (mEq/L)    Glucose, Bld 86  70 - 99 (mg/dL)    BUN 16  6 - 23 (mg/dL)    Creatinine, Ser 1.19  0.50 - 1.10 (mg/dL)    Calcium 9.8  8.4 - 10.5 (mg/dL)    GFR calc non Af Amer 86 (*) >90 (mL/min)    GFR calc Af Amer >90  >90 (mL/min)   POCT HEMOGLOBIN-HEMACUE     Status: Abnormal   Collection Time   09/05/11  9:04 AM      Component Value Range Comment   Hemoglobin 10.5 (*) 12.0 - 15.0 (g/dL)  No results found.  ROS  Blood pressure 114/67, pulse 80, temperature 97.4 F (36.3 C), temperature source Oral, resp. rate 16, height 5' 4.5" (1.638 m), weight 110 lb (49.896 kg), SpO2 100.00%. Physical Exam  Constitutional: She appears well-developed and well-nourished.  Respiratory:       Assessment/Plan History of breast cancer Plan port removal today.  Ahri Olson 09/05/2011, 9:37 AM

## 2011-09-05 NOTE — Op Note (Signed)
Preoperative diagnosis: Stage III breast cancer and is now completed therapy with no more need for venous access Postoperative diagnosis: Same as above  Procedure: Left subclavian port removal Anesthesia: Local with monitored anesthesia care Surgeon: Dr. Harden Mo Specimens: None Consultations: None  Estimated blood loss: Minimal Sponge needle count correct at end of operation Disposition the patient to recovery room in stable condition  Indications: This is a 70 short female with stage III right breast cancer treated with right modified radical mastectomy, chemotherapy, and radiation therapy. She is now completed her need for venous access. We'll discuss port removal.  Procedure: After informed consent was obtained the patient was taken to the operating room. She had sequential compression devices on her lower extremities. She was placed under monitored anesthesia care. Her left chest was prepped and draped in the standard sterile surgical fashion. A surgical timeout was performed.  I infiltrated her old incision with a combination of 1% lidocaine and quarter percent Marcaine. I then reentered her old incision. Cautery was used to remove her port as well as the entire line in the stitch material attaching it. Hemostasis was then observed. I closed this with 3-0 Vicryl and 4-0 Monocryl. Dermabond was placed over the incision. She tolerated this well was transferred to recovery room in stable condition.

## 2011-09-05 NOTE — Interval H&P Note (Signed)
History and Physical Interval Note:  09/05/2011 9:39 AM  Rachel Mcbride  has presented today for surgery, with the diagnosis of breast cancer/un-needed port  The various methods of treatment have been discussed with the patient and family. After consideration of risks, benefits and other options for treatment, the patient has consented to  Procedure(s): REMOVAL PORT-A-CATH as a surgical intervention .  The patients' history has been reviewed, patient examined, no change in status, stable for surgery.  I have reviewed the patients' chart and labs.  Questions were answered to the patient's satisfaction.     Bobbiejo Ishikawa

## 2011-09-05 NOTE — Anesthesia Postprocedure Evaluation (Signed)
Anesthesia Post Note  Patient: Rachel Mcbride  Procedure(s) Performed:  REMOVAL PORT-A-CATH  Anesthesia type: MAC  Patient location: PACU  Post pain: Pain level controlled  Post assessment: Patient's Cardiovascular Status Stable  Last Vitals:  Filed Vitals:   09/05/11 1045  BP: 120/66  Pulse: 65  Temp:   Resp: 16    Post vital signs: Reviewed and stable  Level of consciousness: alert  Complications: No apparent anesthesia complications

## 2011-09-05 NOTE — Anesthesia Procedure Notes (Addendum)
Performed by: Zenia Resides D   Procedure Name: MAC Date/Time: 09/05/2011 9:45 AM Performed by: Zenia Resides D Pre-anesthesia Checklist: Patient identified, Emergency Drugs available, Suction available, Patient being monitored and Timeout performed Patient Re-evaluated:Patient Re-evaluated prior to inductionOxygen Delivery Method: Simple face mask Placement Confirmation: positive ETCO2

## 2011-09-05 NOTE — Transfer of Care (Signed)
Immediate Anesthesia Transfer of Care Note  Patient: Rachel Mcbride  Procedure(s) Performed:  REMOVAL PORT-A-CATH  Patient Location: PACU  Anesthesia Type: MAC  Level of Consciousness: awake, alert  and oriented  Airway & Oxygen Therapy: Patient Spontanous Breathing  Post-op Assessment: Report given to PACU RN and Post -op Vital signs reviewed and stable  Post vital signs: Reviewed and stable  Complications: No apparent anesthesia complications

## 2011-09-06 ENCOUNTER — Encounter (HOSPITAL_BASED_OUTPATIENT_CLINIC_OR_DEPARTMENT_OTHER): Payer: Self-pay | Admitting: General Surgery

## 2011-09-10 ENCOUNTER — Ambulatory Visit (INDEPENDENT_AMBULATORY_CARE_PROVIDER_SITE_OTHER)
Admission: RE | Admit: 2011-09-10 | Discharge: 2011-09-10 | Disposition: A | Discharge status: Home / Self Care | Payer: Medicare Other | Source: Ambulatory Visit | Attending: Pulmonary Disease | Admitting: Pulmonary Disease

## 2011-09-10 DIAGNOSIS — J984 Other disorders of lung: Secondary | ICD-10-CM

## 2011-09-10 MED ORDER — IOHEXOL 300 MG/ML  SOLN
80.0000 mL | Freq: Once | INTRAMUSCULAR | Status: AC | PRN
  Administered 2011-09-10: 80 mL via INTRAVENOUS

## 2011-09-24 ENCOUNTER — Telehealth: Payer: Self-pay | Admitting: Cardiovascular Disease

## 2011-09-24 NOTE — Telephone Encounter (Signed)
New Problem   Please return call to patient husband Richard at [employer]#, would like to know if patient will required any bloodwork or fasting test.

## 2011-09-24 NOTE — Telephone Encounter (Signed)
SPOKE WITH WIFE, INFORMED HER IF SHE HASNT HAD CHOLESTEROL CHECKED SHE WILL NEED TO COME IN FASTING.

## 2011-10-08 ENCOUNTER — Ambulatory Visit (INDEPENDENT_AMBULATORY_CARE_PROVIDER_SITE_OTHER): Payer: Medicare Other | Admitting: General Surgery

## 2011-10-08 ENCOUNTER — Encounter (INDEPENDENT_AMBULATORY_CARE_PROVIDER_SITE_OTHER): Payer: Self-pay | Admitting: General Surgery

## 2011-10-08 VITALS — BP 124/74 | HR 70 | Resp 16 | Ht 64.5 in | Wt 108.0 lb

## 2011-10-08 DIAGNOSIS — Z09 Encounter for follow-up examination after completed treatment for conditions other than malignant neoplasm: Secondary | ICD-10-CM

## 2011-10-08 NOTE — Progress Notes (Signed)
Subjective:     Patient ID: Rachel Mcbride, female   DOB: 01-20-1946, 66 y.o.   MRN: [id number]  HPI  This is a 66 year old female who underwent of her prior treatment for breast cancer. I recently took report out and she comes in today for followup for that. She reports no problems and is back to all her normal activities.  Review of Systems     Objective:   Physical Exam Well healed left chest incision    Assessment:     S/p port removal    Plan:     She is doing well and I released at full activity. She would like just to see medical oncology for followup to minimize visits twice her to call me as needed.

## 2011-10-08 NOTE — Patient Instructions (Signed)

## 2011-11-26 ENCOUNTER — Ambulatory Visit (INDEPENDENT_AMBULATORY_CARE_PROVIDER_SITE_OTHER): Payer: Medicare Other | Admitting: Cardiovascular Disease

## 2011-11-26 ENCOUNTER — Encounter: Payer: Self-pay | Admitting: Cardiovascular Disease

## 2011-11-26 VITALS — BP 123/71 | HR 84 | Ht 64.0 in | Wt 106.1 lb

## 2011-11-26 DIAGNOSIS — I4892 Unspecified atrial flutter: Secondary | ICD-10-CM

## 2011-11-26 DIAGNOSIS — I509 Heart failure, unspecified: Secondary | ICD-10-CM

## 2011-11-26 MED ORDER — FUROSEMIDE 20 MG PO TABS: 20.0000 mg | ORAL_TABLET | Freq: Every day | ORAL | Status: DC

## 2011-11-26 NOTE — Progress Notes (Signed)
Rachel Mcbride Date of Birth  10/28/1945 [facility] [address]    [address] [place], [place]  [address] [phone]  Fax  [phone]   Problems: 1. History breast cancer-status post chemotherapy 2. Atrial flutter 3. COPD 4. Diastolic congestive heart failure  History of Present Illness:  66 year old female pitch has a history of atrial flutter. She also has a history of breast cancer.  She has received  chemotherapy and radiation.  SHe stopped smoking last year now smokes an electronic cigarette.  She has a history of COPD. Her oxygen levels have been low but have been gradually improving.  She still has some generalized shortness of breath. She does not get any regular exercise. She denies any syncope or presyncope.  Because of her shortness breath we performed a stress test this past January. She was found to have no evidence of ischemia. She has normal left ventricular systolic function.  She was receiving chemotherapy. Her Port-A-Cath has now been removed.  She is now fairly active and is back to doing her normal pre-cancer activities.  Current Outpatient Prescriptions on File Prior to Visit  Medication Sig Dispense Refill  . Albuterol Sulfate (PROAIR HFA IN) Inhale into the lungs as needed.        Marland Kitchen alendronate (FOSAMAX) 35 MG tablet Take 1 tablet by mouth Once a week. Takes on Wednesday      . anastrozole (ARIMIDEX) 1 MG tablet Take 1 tablet (1 mg total) by mouth daily. AM  90 tablet  3  . aspirin 81 MG tablet Take 81 mg by mouth daily.       . Calcium Carbonate (CALCIUM 600 PO) Take by mouth 2 (two) times daily.       . Cholecalciferol (VITAMIN D PO) Take 1,000 Units by mouth daily.       . furosemide (LASIX) 20 MG tablet Take 1 tablet (20 mg total) by mouth 2 (two) times daily.  60 tablet  5  . lisinopril (PRINIVIL,ZESTRIL) 10 MG tablet Take 1 tablet (10 mg total) by mouth daily.  30 tablet  5    Allergies  Allergen Reactions  . Pneumococcal Vaccines Itching   . Shellfish-Derived Products Itching    Past Medical History  Diagnosis Date  . Breast cancer 04/2010  . Hypertension     under control; has been on med. x 2 yrs.  . CHF (congestive heart failure) 2011  . Atrial flutter 2011    occurred with CHF; no current dysrhythmia  . COPD (chronic obstructive pulmonary disease)     denies SOB with daily activities  . Pneumonia 2011  . Scoliosis     since chemo  . Neuropathy of leg     since chemo; right leg  . Cough 08/31/2011    Past Surgical History  Procedure Date  . Portacath placement 05/17/2010  . Mastectomy 04/20/2010    right  . Bronchoscopy 04/03/2010    with endobronchial ultrasound  . Port-a-cath removal 09/05/2011    Procedure: REMOVAL PORT-A-CATH;  Surgeon: Emelia Loron, MD;  Location: [facility];  Service: General;  Laterality: Left;    History  Smoking status  . Former Smoker  . Types: Cigarettes  . Quit date: 07/14/2010  Smokeless tobacco  . Never Used  Comment: uses electronic cigarette    History  Alcohol Use  . 0.0 oz/week    2-3 x/week    Family History  Problem Relation Age of Onset  . Heart attack Mother   .  Stroke Mother     Reviw of Systems:  Reviewed in the HPI.  All other systems are negative.  Physical Exam: BP 123/71  Pulse 84  Ht 5\' 4"  (1.626 m)  Wt 106 lb 1.9 oz (48.136 kg)  BMI 18.22 kg/m2 The patient is alert and oriented x 3.  The mood and affect are normal.   Skin: warm and dry.  Color is normal.    HEENT:   the sclera are nonicteric.  The mucous membranes are moist.  The carotids are 2+ without bruits.  There is no thyromegaly.  There is no JVD.    Lungs: clear.  The chest wall is non tender.    Heart: regular rate with a normal S1 and S2.  There are no murmurs, gallops, or rubs. The PMI is not displaced.     Abdomen: good bowel sounds.  There is no guarding or rebound.  There is no hepatosplenomegaly or tenderness.  There are no masses.   Extremities:  no  clubbing, cyanosis, or edema.  The legs are without rashes.  The distal pulses are intact.   Neuro:  Cranial nerves II - XII are intact.  Motor and sensory functions are intact.    The gait is normal.  ECG:  Assessment / Plan:

## 2011-11-26 NOTE — Assessment & Plan Note (Signed)
She is maintaining NSR.  Feeling well.  Continue current meds.

## 2011-11-26 NOTE — Patient Instructions (Signed)
Your physician wants you to follow-up in: 6 MONTHS  You will receive a reminder letter in the mail two months in advance. If you don't receive a letter, please call our office to schedule the follow-up appointment.   Your physician has recommended you make the following change in your medication:   DECREASE LASIX TO 20 MG ONCE DAILY.

## 2011-11-26 NOTE — Assessment & Plan Note (Signed)
She has had diastolic CHF in the past.  We will try decreasing her Lasix to 20 mg a day.

## 2011-12-31 ENCOUNTER — Ambulatory Visit (HOSPITAL_BASED_OUTPATIENT_CLINIC_OR_DEPARTMENT_OTHER): Payer: Medicare Other | Admitting: Oncology

## 2011-12-31 ENCOUNTER — Ambulatory Visit: Payer: Medicare Other | Admitting: Oncology

## 2011-12-31 ENCOUNTER — Telehealth: Payer: Self-pay | Admitting: *Deleted

## 2011-12-31 ENCOUNTER — Other Ambulatory Visit: Payer: Self-pay | Admitting: Oncology

## 2011-12-31 ENCOUNTER — Other Ambulatory Visit: Payer: Medicare Other | Admitting: Lab

## 2011-12-31 ENCOUNTER — Encounter: Payer: Self-pay | Admitting: Oncology

## 2011-12-31 ENCOUNTER — Other Ambulatory Visit (HOSPITAL_BASED_OUTPATIENT_CLINIC_OR_DEPARTMENT_OTHER): Payer: Medicare Other | Admitting: Lab

## 2011-12-31 VITALS — BP 143/81 | HR 86 | Temp 96.9°F | Ht 64.0 in | Wt 107.9 lb

## 2011-12-31 DIAGNOSIS — Z853 Personal history of malignant neoplasm of breast: Secondary | ICD-10-CM

## 2011-12-31 DIAGNOSIS — C50919 Malignant neoplasm of unspecified site of unspecified female breast: Secondary | ICD-10-CM

## 2011-12-31 DIAGNOSIS — M8580 Other specified disorders of bone density and structure, unspecified site: Secondary | ICD-10-CM | POA: Insufficient documentation

## 2011-12-31 DIAGNOSIS — F411 Generalized anxiety disorder: Secondary | ICD-10-CM

## 2011-12-31 DIAGNOSIS — D649 Anemia, unspecified: Secondary | ICD-10-CM

## 2011-12-31 DIAGNOSIS — C50419 Malignant neoplasm of upper-outer quadrant of unspecified female breast: Secondary | ICD-10-CM

## 2011-12-31 DIAGNOSIS — M949 Disorder of cartilage, unspecified: Secondary | ICD-10-CM

## 2011-12-31 DIAGNOSIS — D638 Anemia in other chronic diseases classified elsewhere: Secondary | ICD-10-CM | POA: Insufficient documentation

## 2011-12-31 HISTORY — DX: Other specified disorders of bone density and structure, unspecified site: M85.80

## 2011-12-31 LAB — COMPREHENSIVE METABOLIC PANEL
AST: 17 U/L (ref 0–37)
Albumin: 4.4 g/dL (ref 3.5–5.2)
Alkaline Phosphatase: 40 U/L (ref 39–117)
BUN: 13 mg/dL (ref 6–23)
Calcium: 9.5 mg/dL (ref 8.4–10.5)
Creatinine, Ser: 0.8 mg/dL (ref 0.50–1.10)
Glucose, Bld: 77 mg/dL (ref 70–99)
Potassium: 4.7 mEq/L (ref 3.5–5.3)

## 2011-12-31 LAB — IRON AND TIBC
%SAT: 30 % (ref 20–55)
Iron: 88 ug/dL (ref 42–145)
TIBC: 297 ug/dL (ref 250–470)
UIBC: 209 ug/dL (ref 125–400)

## 2011-12-31 LAB — CBC WITH DIFFERENTIAL/PLATELET
Basophils Absolute: 0 10*3/uL (ref 0.0–0.1)
Eosinophils Absolute: 0.1 10*3/uL (ref 0.0–0.5)
HCT: 33.3 % — ABNORMAL LOW (ref 34.8–46.6)
HGB: 10.9 g/dL — ABNORMAL LOW (ref 11.6–15.9)
MCV: 93.5 fL (ref 79.5–101.0)
NEUT#: 2.9 10*3/uL (ref 1.5–6.5)
NEUT%: 71.7 % (ref 38.4–76.8)
RDW: 13.2 % (ref 11.2–14.5)
lymph#: 0.6 10*3/uL — ABNORMAL LOW (ref 0.9–3.3)

## 2011-12-31 LAB — FERRITIN: Ferritin: 332 ng/mL — ABNORMAL HIGH (ref 10–291)

## 2011-12-31 NOTE — Telephone Encounter (Signed)
gave patient appointment for 07-02-2012 starting at 10:15am  printed out calendar and gave to the patient

## 2011-12-31 NOTE — Progress Notes (Signed)
[facility]  Telephone:([phone]) [phone] [phone]   OFFICE PROGRESS NOTE   Cc:  Kaleen Mask, MD, MD   DIAGNOSES:  1. A [id number] (stage IIIA) invasive ductal carcinoma measuring 2.4 cm, grade 2 out of 3, status post right mastectomy with axillary lymph node dissection on April 20, 2010, with negative surgical margins; 6 out of 17 lymph nodes positive, with extracapsular extension. There was lymphovascular invasion. ER 81%, PR 64%, Ki-67 of 16%, HER-2/neu by CISH was negative ratio of 1.46.  2. Subcarinal adenopathy positive on PET scan in 03/2010; however, EBUS FNA was negative. Follow up CT chest in 07/2010 showed stable calcified subcarinal node. She has been followed by Dr. Vassie Loll.   PAST THERAPY: s/p adjuvant chemotherapy with dose dense AC followed by weekly Taxol finished on 09/21/2010. She terminated Taxol prematurely due to neuropathy, fatigue.   CURRENT THERAPY: started Arimidex in May 2012.  INTERVAL HISTORY: Rachel Mcbride 66 y.o. female returns for regular follow up with her husband.  She reports feeling relatively well. She is having anxiety due to her son's situation.  She denies severe depression, adohenia, insomnia, anorexia, significant weight change.  She is taking Arimidex without hot flash, severe bone pain, chest pain, focal motor weakness.  She performs self breast exam on a regular basis but has not found abnormality.   Patient denies fatigue, headache, visual changes, confusion, drenching night sweats, palpable lymph node swelling, mucositis, odynophagia, dysphagia, nausea vomiting, jaundice, chest pain, palpitation, shortness of breath, dyspnea on exertion, productive cough, gum bleeding, epistaxis, hematemesis, hemoptysis, abdominal pain, abdominal swelling, early satiety, melena, hematochezia, hematuria, skin rash, spontaneous bleeding, joint swelling, joint pain, heat or cold intolerance, bowel bladder incontinence, back pain, focal  motor weakness, paresthesia, depression, suicidal or homocidal ideation, feeling hopelessness.   Past Medical History  Diagnosis Date  . Breast cancer 04/2010  . Hypertension     under control; has been on med. x 2 yrs.  . CHF (congestive heart failure) 2011  . Atrial flutter 2011    occurred with CHF; no current dysrhythmia  . COPD (chronic obstructive pulmonary disease)     denies SOB with daily activities  . Pneumonia 2011  . Scoliosis     since chemo  . Neuropathy of leg     since chemo; right leg  . Cough 08/31/2011  . Anemia     Past Surgical History  Procedure Date  . Portacath placement 05/17/2010  . Mastectomy 04/20/2010    right  . Bronchoscopy 04/03/2010    with endobronchial ultrasound  . Port-a-cath removal 09/05/2011    Procedure: REMOVAL PORT-A-CATH;  Surgeon: Emelia Loron, MD;  Location: [facility];  Service: General;  Laterality: Left;    Current Outpatient Prescriptions  Medication Sig Dispense Refill  . alendronate (FOSAMAX) 35 MG tablet Take 1 tablet by mouth Once a week. Takes on Wednesday      . anastrozole (ARIMIDEX) 1 MG tablet Take 1 tablet (1 mg total) by mouth daily. AM  90 tablet  3  . aspirin 81 MG tablet Take 81 mg by mouth daily.       . Calcium Carbonate (CALCIUM 600 PO) Take by mouth 2 (two) times daily.       . Cholecalciferol (VITAMIN D PO) Take 1,000 Units by mouth daily.       . furosemide (LASIX) 20 MG tablet Take 1 tablet (20 mg total) by mouth daily.  30 tablet  5  . lisinopril (PRINIVIL,ZESTRIL)  10 MG tablet Take 1 tablet (10 mg total) by mouth daily.  30 tablet  5  . Albuterol Sulfate (PROAIR HFA IN) Inhale into the lungs as needed.          ALLERGIES:  is allergic to pneumococcal vaccines and shellfish-derived products.  REVIEW OF SYSTEMS:  The rest of the 14-point review of system was negative.   Filed Vitals:   12/31/11 1328  BP: 143/81  Pulse: 86  Temp: 96.9 F (36.1 C)   Wt Readings from Last 3  Encounters:  12/31/11 107 lb 14.4 oz (48.943 kg)  11/26/11 106 lb 1.9 oz (48.136 kg)  10/08/11 108 lb (48.988 kg)   ECOG Performance status: 0-1  PHYSICAL EXAMINATION:      General: well-nourished in no acute distress. Eyes: no scleral icterus. ENT: There were no oropharyngeal lesions. Neck was without thyromegaly. Lymphatics: Negative cervical, supraclavicular or axillary adenopathy. Respiratory: lungs were clear bilaterally without wheezing or crackles. Cardiovascular: Regular rate and rhythm, S1/S2, without murmur, rub or gallop. There was no pedal edema. GI: abdomen was soft, flat, nontender, nondistended, without organomegaly. Muscoloskeletal: no spinal tenderness of palpation of vertebral spine. There was no tenderness to palpation of her right shoulder. Skin exam was without echymosis, petichae. Neuro exam notable for her ability to raise her right arm only to her shoulder level. Patient was able to get on and off exam table without assistance. Gait was normal. Patient was alerted and oriented. Attention was good. Language was appropriate. Mood was normal without depression. Speech was not pressured. Thought content was not tangential. Right mastectomy scar was well healed without lesions. Left breast exam was negative.     LABORATORY/RADIOLOGY DATA:  Lab Results  Component Value Date   WBC 4.0 12/31/2011   HGB 10.9* 12/31/2011   HCT 33.3* 12/31/2011   PLT 211 12/31/2011   GLUCOSE 77 12/31/2011   ALKPHOS 40 12/31/2011   ALT <8 12/31/2011   AST 17 12/31/2011   NA 138 12/31/2011   K 4.7 12/31/2011   CL 102 12/31/2011   CREATININE 0.80 12/31/2011   BUN 13 12/31/2011   CO2 27 12/31/2011   INR 1.12 07/15/2010    ASSESSMENT AND PLAN:   3. History of breast cancer. She is doing well on adjuvant Arimidex. She has no evidence of local or distal met. I advised her to continue adjuvant Arimidex for 5 years total.  4. Right arm tightness: improved compared to before.  She was able to raise it  higher above her head than last time.  5. Osteopenia: Fosamax 35mg  PO weekly and Cal/Vit D bid.  6. History of atrial fibrillation. She is on diltiazem and digoxin per cardiologist. 7. Hypertension, well-controlled on lisinopril, diltizem per PCP.  8. History of congestive heart failure. She is euvolemic at this time. She is on lisinopril and Lasix per PCP. Lasix was recently decreased per Cardiology.  9. Anemia:  She never had colon cancer screening.  I sent for iron panel today.  I strongly urged her to discuss with her PCP to be referred to a GI service for evaluation of screening colonoscopy especially if she has iron deficiency anemia.  She is not enthused with the concept of colonoscopy.  I advised her that if she has colon cancer, it is mostly curable if caught early on coloscopy.  She would like to defer this unless she has iron deficiency anemia.  Iron panel is pending today.  10. Anxiety:  No sign of major depression.  I do not think that she would benefit from antidepressant; however, I advised her to consult with her PCP for further evaluation.  11. History of smoking. She is only using electronic smokless cigarettes at this time.  12. Subcarinal node on PET pre therapy for breast cancer: f/u chest CT in 2012 showed resolution of this node.  Will continue observation unless she picks up smoking again or has symptoms.  13. Surveillance: I requested screening left breast mammogram and bone density for August 2013.  14. Follow up in about 6 months.      The length of time of the face-to-face encounter was 25 minutes. More than 50% of time was spent counseling and coordination of care.

## 2012-01-01 ENCOUNTER — Telehealth: Payer: Self-pay | Admitting: *Deleted

## 2012-01-01 NOTE — Telephone Encounter (Signed)
per orders from 12-31-2011 schedule patient appointment for bone density and mammogram on 03-2012

## 2012-01-03 ENCOUNTER — Other Ambulatory Visit: Payer: Self-pay | Admitting: *Deleted

## 2012-01-03 DIAGNOSIS — C50919 Malignant neoplasm of unspecified site of unspecified female breast: Secondary | ICD-10-CM

## 2012-01-03 MED ORDER — ALENDRONATE SODIUM 35 MG PO TABS: 35.0000 mg | ORAL_TABLET | ORAL | Status: DC

## 2012-02-08 ENCOUNTER — Other Ambulatory Visit: Payer: Self-pay | Admitting: Cardiovascular Disease

## 2012-02-08 MED ORDER — LISINOPRIL 10 MG PO TABS: 10.0000 mg | ORAL_TABLET | Freq: Every day | ORAL | Status: DC

## 2012-02-26 ENCOUNTER — Ambulatory Visit (INDEPENDENT_AMBULATORY_CARE_PROVIDER_SITE_OTHER): Payer: Medicare Other | Admitting: Pulmonary Disease

## 2012-02-26 ENCOUNTER — Encounter: Payer: Self-pay | Admitting: Pulmonary Disease

## 2012-02-26 VITALS — BP 118/68 | HR 75 | Temp 97.5°F | Ht 64.5 in | Wt 110.6 lb

## 2012-02-26 DIAGNOSIS — J984 Other disorders of lung: Secondary | ICD-10-CM

## 2012-02-26 DIAGNOSIS — J449 Chronic obstructive pulmonary disease, unspecified: Secondary | ICD-10-CM

## 2012-02-26 NOTE — Patient Instructions (Signed)
Ct  scan in 6 months

## 2012-02-26 NOTE — Assessment & Plan Note (Signed)
Albuterol prn only Pulm rehab in future if deterioration - she is not too keen

## 2012-02-26 NOTE — Progress Notes (Signed)
  Subjective:    Patient ID: Rachel Mcbride, female    DOB: September 23, 1945, 66 y.o.   MRN: [id number]  HPI  66/F , ex- heavy smoker > 50 Pyrs, for FU of COPD & subcarinal lymphadenopathy.  Dec 2011 Spirometry FEV1 at 55%, ratio at 74. Previous FEV1 8/11 at 43%.  She presented with a breast mass in 8/11, diagnosed asT2 N1 Mx lobular breast ca with right axillary LN involvement. PET-CT showed negative right cervical Ln, negative 5 mm & 10 mm nodules in the lung & 9x 14 mm subcarinal Ln with malignant range uptake although this showed a hint of calcification on the CT images - EBUS neg.  FU CT dec'11 >> no nodules, LN unchanged  Underwent MRM, LNs pos -->ER , PR pos, Her-2 neu neg  Pneumovax 04/03/10  Admitted 12/1-12/8 for RML PNA. During her stay she developed a-flutter with cardilogy consult (Nahser). Echo showed EF 45-50%, mild LV reduction , BNP was elevated at 755.  02/15/11  quit smoking CT 7/12 >>Lymph nodes not enlarged.New 5 mm nodule in RUL , not noted in 1/13   02/26/2012 Feels well, Breathing has been ookay--some SOB/wheezing due to heat, cough w/ white phlem. pt still uses the electronic cig. she has never had to use the proair Denies chest pain, orthopnea, hemoptysis, fever, n/v/d, headache.      Review of Systems Patient denies significant dyspnea,cough, hemoptysis,  chest pain, palpitations, pedal edema, orthopnea, paroxysmal nocturnal dyspnea, lightheadedness, nausea, vomiting, abdominal or  leg pains      Objective:   Physical Exam  Gen. Pleasant, well-nourished, in no distress ENT - no lesions, no post nasal drip Neck: No JVD, no thyromegaly, no carotid bruits Lungs: no use of accessory muscles, no dullness to percussion, clear without rales or rhonchi  Cardiovascular: Rhythm regular, heart sounds  normal, no murmurs or gallops, no peripheral edema Musculoskeletal: No deformities, no cyanosis or clubbing        Assessment & Plan:

## 2012-02-26 NOTE — Assessment & Plan Note (Signed)
CT scan in 1/14- as annual FU If [facility], can stop with surveillance Cts

## 2012-03-21 ENCOUNTER — Telehealth: Payer: Self-pay | Admitting: Cardiovascular Disease

## 2012-03-21 DIAGNOSIS — I4892 Unspecified atrial flutter: Secondary | ICD-10-CM

## 2012-03-21 NOTE — Telephone Encounter (Signed)
Order entered for EKG. 

## 2012-03-21 NOTE — Telephone Encounter (Signed)
New problem:  Need an order put into the system for EKG. Per recall.

## 2012-03-31 ENCOUNTER — Ambulatory Visit: Payer: Medicare Other

## 2012-03-31 ENCOUNTER — Other Ambulatory Visit: Payer: Medicare Other

## 2012-04-11 ENCOUNTER — Ambulatory Visit
Admission: RE | Admit: 2012-04-11 | Discharge: 2012-04-11 | Disposition: A | Discharge status: Home / Self Care | Payer: Medicare Other | Source: Ambulatory Visit | Attending: Oncology | Admitting: Oncology

## 2012-04-11 DIAGNOSIS — M8580 Other specified disorders of bone density and structure, unspecified site: Secondary | ICD-10-CM

## 2012-04-11 DIAGNOSIS — C50919 Malignant neoplasm of unspecified site of unspecified female breast: Secondary | ICD-10-CM

## 2012-05-26 ENCOUNTER — Encounter: Payer: Self-pay | Admitting: Cardiovascular Disease

## 2012-05-26 ENCOUNTER — Ambulatory Visit (INDEPENDENT_AMBULATORY_CARE_PROVIDER_SITE_OTHER): Payer: Medicare Other | Admitting: Cardiovascular Disease

## 2012-05-26 ENCOUNTER — Other Ambulatory Visit (INDEPENDENT_AMBULATORY_CARE_PROVIDER_SITE_OTHER): Payer: Medicare Other

## 2012-05-26 VITALS — BP 118/64 | HR 82 | Ht 64.5 in | Wt 110.1 lb

## 2012-05-26 DIAGNOSIS — C50919 Malignant neoplasm of unspecified site of unspecified female breast: Secondary | ICD-10-CM

## 2012-05-26 DIAGNOSIS — I4892 Unspecified atrial flutter: Secondary | ICD-10-CM

## 2012-05-26 DIAGNOSIS — I509 Heart failure, unspecified: Secondary | ICD-10-CM

## 2012-05-26 DIAGNOSIS — R0989 Other specified symptoms and signs involving the circulatory and respiratory systems: Secondary | ICD-10-CM

## 2012-05-26 NOTE — Assessment & Plan Note (Signed)
She has normal left ventricular systolic function. She is thought to have some diastolic dysfunction but she's not had any problems since she's gotten through with her chemotherapy. She would like to try stopping her Lasix which I think is perfectly fine.  She'll stop the Lasix today. Her husband will continue to watch her for any signs of swelling. She has been given the okay to restart the Lasix at 20 mg 2-3 times a week if needed. She'll call me if she needs to restart the Lasix. We'll continue with the lisinopril for the time being.

## 2012-05-26 NOTE — Patient Instructions (Addendum)
Your physician wants you to follow-up in: 6 months  You will receive a reminder letter in the mail two months in advance. If you don't receive a letter, please call our office to schedule the follow-up appointment.  Your physician recommends that you return for a FASTING lipid profile: next week  Your physician has recommended you make the following change in your medication:   STOP LASIX

## 2012-05-26 NOTE — Progress Notes (Signed)
Rachel Mcbride Date of Birth  Aug 19, 1945 [facility] [address]    [address] [place], [place]  [address] [phone]  Fax  [phone]   Problems: 1. History breast cancer-status post chemotherapy 2. Atrial flutter 3. COPD 4. Diastolic congestive heart failure  History of Present Illness:  66 year old female with a  history of atrial flutter. She also has a history of breast cancer.  She has received  chemotherapy and radiation.  SHe stopped smoking last year now smokes an electronic cigarette.  She has a history of COPD. she has had transient congestive heart failure but this seems to have resolved.   Her oxygen levels have been low but have been gradually improving.  She still has some generalized shortness of breath. She does not get any regular exercise. She denies any syncope or presyncope.  Because of her shortness breath we performed a stress test this past January. She was found to have no evidence of ischemia. She has normal left ventricular systolic function.  She was receiving chemotherapy. Her Port-A-Cath has now been removed.  She is now fairly active and is back to doing her normal pre-cancer activities.  She is walking some ( 600 feet to the mail box and back).  She is still having some problems with peripheral neuropathy.  She also has had worsened some degree of her scoliosis.   Current Outpatient Prescriptions on File Prior to Visit  Medication Sig Dispense Refill  . alendronate (FOSAMAX) 35 MG tablet Take 1 tablet (35 mg total) by mouth every 7 (seven) days. Takes on Wednesday  12 tablet  1  . anastrozole (ARIMIDEX) 1 MG tablet Take 1 tablet (1 mg total) by mouth daily. AM  90 tablet  3  . aspirin 81 MG tablet Take 81 mg by mouth daily.       . Calcium Carbonate (CALCIUM 600 PO) Take by mouth 2 (two) times daily.       . Cholecalciferol (VITAMIN D PO) Take 1,000 Units by mouth daily.       . furosemide (LASIX) 20 MG tablet Take 1 tablet (20 mg total) by  mouth daily.  30 tablet  5  . lisinopril (PRINIVIL,ZESTRIL) 10 MG tablet Take 1 tablet (10 mg total) by mouth daily.  30 tablet  5    Allergies  Allergen Reactions  . Pneumococcal Vaccines Itching  . Shellfish-Derived Products Itching    Past Medical History  Diagnosis Date  . Breast cancer 04/2010  . Hypertension     under control; has been on med. x 2 yrs.  . CHF (congestive heart failure) 2011  . Atrial flutter 2011    occurred with CHF; no current dysrhythmia  . COPD (chronic obstructive pulmonary disease)     denies SOB with daily activities  . Pneumonia 2011  . Scoliosis     since chemo  . Neuropathy of leg     since chemo; right leg  . Cough 08/31/2011  . Anemia   . Osteopenia 12/31/2011    Past Surgical History  Procedure Date  . Portacath placement 05/17/2010  . Mastectomy 04/20/2010    right  . Bronchoscopy 04/03/2010    with endobronchial ultrasound  . Port-a-cath removal 09/05/2011    Procedure: REMOVAL PORT-A-CATH;  Surgeon: Emelia Loron, MD;  Location: [facility];  Service: General;  Laterality: Left;    History  Smoking status  . Former Smoker -- 2.0 packs/day for 50 years  . Types: Cigarettes  .  Quit date: 07/14/2010  Smokeless tobacco  . Never Used  Comment: uses electronic cigarette    History  Alcohol Use  . 0.0 oz/week    2-3 x/week    Family History  Problem Relation Age of Onset  . Heart attack Mother   . Stroke Mother     Reviw of Systems:  Reviewed in the HPI.  All other systems are negative.  Physical Exam: BP 118/64  Pulse 82  Ht 5' 4.5" (1.638 m)  Wt 110 lb 1.9 oz (49.95 kg)  BMI 18.61 kg/m2 The patient is alert and oriented x 3.  The mood and affect are normal.   Skin: warm and dry.  Color is normal.    HEENT:   the sclera are nonicteric.  The mucous membranes are moist.  The carotids are 2+ without bruits.  There is no thyromegaly.  There is no JVD.    Lungs: clear.  The chest wall is non tender.     Heart: regular rate with a normal S1 and S2.  There are no murmurs, gallops, or rubs. The PMI is not displaced.     Abdomen: good bowel sounds.  There is no guarding or rebound.  There is no hepatosplenomegaly or tenderness.  There are no masses.   Extremities:  no clubbing, cyanosis, or edema.  The legs are without rashes.  The distal pulses are intact.   Neuro:  Cranial nerves II - XII are intact.  Motor and sensory functions are intact.    The gait is normal.  ECG: May 26, 2012-normal sinus rhythm at 82 beats a minute. EKG is normal. Assessment / Plan:

## 2012-06-03 ENCOUNTER — Other Ambulatory Visit (INDEPENDENT_AMBULATORY_CARE_PROVIDER_SITE_OTHER): Payer: Medicare Other

## 2012-06-03 DIAGNOSIS — I509 Heart failure, unspecified: Secondary | ICD-10-CM

## 2012-06-03 DIAGNOSIS — I4892 Unspecified atrial flutter: Secondary | ICD-10-CM

## 2012-06-03 DIAGNOSIS — R0989 Other specified symptoms and signs involving the circulatory and respiratory systems: Secondary | ICD-10-CM

## 2012-06-03 LAB — HEPATIC FUNCTION PANEL
Alkaline Phosphatase: 35 U/L — ABNORMAL LOW (ref 39–117)
Bilirubin, Direct: 0.1 mg/dL (ref 0.0–0.3)
Total Bilirubin: 0.4 mg/dL (ref 0.3–1.2)
Total Protein: 6.8 g/dL (ref 6.0–8.3)

## 2012-06-03 LAB — BASIC METABOLIC PANEL
BUN: 18 mg/dL (ref 6–23)
Chloride: 105 mEq/L (ref 96–112)
Creatinine, Ser: 0.9 mg/dL (ref 0.4–1.2)
Glucose, Bld: 85 mg/dL (ref 70–99)

## 2012-06-03 LAB — LIPID PANEL
Cholesterol: 178 mg/dL (ref 0–200)
LDL Cholesterol: 88 mg/dL (ref 0–99)

## 2012-06-11 ENCOUNTER — Telehealth: Payer: Self-pay | Admitting: *Deleted

## 2012-06-11 NOTE — Telephone Encounter (Signed)
Pt called to report pins and needles in bilat feet over past few days,  Was very bad yesterday but better today.  Per Dr. Gaylyn Rong, pt to call PCP to assess neuropathy in feet.  Explained to pt and she agreed to contact Dr. Jeannetta Nap.

## 2012-06-13 ENCOUNTER — Other Ambulatory Visit: Payer: Self-pay | Admitting: *Deleted

## 2012-06-13 DIAGNOSIS — C50919 Malignant neoplasm of unspecified site of unspecified female breast: Secondary | ICD-10-CM

## 2012-06-13 MED ORDER — ALENDRONATE SODIUM 35 MG PO TABS: 35.0000 mg | ORAL_TABLET | ORAL | Status: DC

## 2012-07-01 ENCOUNTER — Other Ambulatory Visit: Payer: Self-pay | Admitting: Oncology

## 2012-07-01 DIAGNOSIS — C50919 Malignant neoplasm of unspecified site of unspecified female breast: Secondary | ICD-10-CM

## 2012-07-02 ENCOUNTER — Other Ambulatory Visit (HOSPITAL_BASED_OUTPATIENT_CLINIC_OR_DEPARTMENT_OTHER): Payer: Medicare Other | Admitting: Lab

## 2012-07-02 ENCOUNTER — Ambulatory Visit (HOSPITAL_BASED_OUTPATIENT_CLINIC_OR_DEPARTMENT_OTHER): Payer: Medicare Other | Admitting: Oncology

## 2012-07-02 ENCOUNTER — Encounter: Payer: Self-pay | Admitting: Oncology

## 2012-07-02 ENCOUNTER — Telehealth: Payer: Self-pay | Admitting: Oncology

## 2012-07-02 VITALS — BP 135/76 | HR 72 | Temp 97.1°F | Resp 20 | Ht 64.5 in | Wt 111.1 lb

## 2012-07-02 DIAGNOSIS — C773 Secondary and unspecified malignant neoplasm of axilla and upper limb lymph nodes: Secondary | ICD-10-CM

## 2012-07-02 DIAGNOSIS — M899 Disorder of bone, unspecified: Secondary | ICD-10-CM

## 2012-07-02 DIAGNOSIS — C50919 Malignant neoplasm of unspecified site of unspecified female breast: Secondary | ICD-10-CM | POA: Diagnosis not present

## 2012-07-02 DIAGNOSIS — D649 Anemia, unspecified: Secondary | ICD-10-CM

## 2012-07-02 DIAGNOSIS — C50419 Malignant neoplasm of upper-outer quadrant of unspecified female breast: Secondary | ICD-10-CM

## 2012-07-02 LAB — COMPREHENSIVE METABOLIC PANEL (CC13)
AST: 18 U/L (ref 5–34)
Albumin: 4 g/dL (ref 3.5–5.0)
Alkaline Phosphatase: 46 U/L (ref 40–150)
Glucose: 80 mg/dl (ref 70–99)
Potassium: 5.2 mEq/L — ABNORMAL HIGH (ref 3.5–5.1)
Sodium: 137 mEq/L (ref 136–145)
Total Bilirubin: 0.39 mg/dL (ref 0.20–1.20)
Total Protein: 7.1 g/dL (ref 6.4–8.3)

## 2012-07-02 LAB — CBC WITH DIFFERENTIAL/PLATELET
Basophils Absolute: 0.1 10*3/uL (ref 0.0–0.1)
EOS%: 7.4 % — ABNORMAL HIGH (ref 0.0–7.0)
Eosinophils Absolute: 0.3 10*3/uL (ref 0.0–0.5)
HCT: 35.5 % (ref 34.8–46.6)
HGB: 11.5 g/dL — ABNORMAL LOW (ref 11.6–15.9)
LYMPH%: 23.4 % (ref 14.0–49.7)
MCH: 30.7 pg (ref 25.1–34.0)
MCV: 94.7 fL (ref 79.5–101.0)
MONO%: 10.5 % (ref 0.0–14.0)
NEUT%: 57.4 % (ref 38.4–76.8)
Platelets: 210 10*3/uL (ref 145–400)

## 2012-07-02 NOTE — Progress Notes (Signed)
[facility]  Telephone:(336) [phone] Fax:([phone]   OFFICE PROGRESS NOTE   Cc:  Kaleen Mask, MD   DIAGNOSES:  1. A pT2pN2aMX (stage IIIA) invasive ductal carcinoma measuring 2.4 cm, grade 2 out of 3, status post right mastectomy with axillary lymph node dissection on April 20, 2010, with negative surgical margins; 6 out of 17 lymph nodes positive, with extracapsular extension. There was lymphovascular invasion. ER 81%, PR 64%, Ki-67 of 16%, HER-2/neu by CISH was negative ratio of 1.46.  2. Subcarinal adenopathy positive on PET scan in 03/2010; however, EBUS FNA was negative. Follow up CT chest in 07/2010 showed stable calcified subcarinal node. She has been followed by Dr. Vassie Loll.   PAST THERAPY: s/p adjuvant chemotherapy with dose dense AC followed by weekly Taxol finished on 09/21/2010. She terminated Taxol prematurely due to neuropathy, fatigue.   CURRENT THERAPY: started Arimidex in May 2012.  INTERVAL HISTORY: Rachel Mcbride 66 y.o. female returns for regular follow up with her husband.  She reports feeling relatively well. She denies severe depression, adohenia, insomnia, anorexia, significant weight change.  She is taking Arimidex without hot flash, severe bone pain, chest pain, focal motor weakness.  She performs self breast exam on a regular basis but has not found abnormality.   Patient denies fatigue, headache, visual changes, confusion, drenching night sweats, palpable lymph node swelling, mucositis, odynophagia, dysphagia, nausea vomiting, jaundice, chest pain, palpitation, shortness of breath, dyspnea on exertion, productive cough, gum bleeding, epistaxis, hematemesis, hemoptysis, abdominal pain, abdominal swelling, early satiety, melena, hematochezia, hematuria, skin rash, spontaneous bleeding, joint swelling, joint pain, heat or cold intolerance, bowel bladder incontinence, back pain, focal motor weakness, paresthesia, depression, suicidal or  homocidal ideation, feeling hopelessness.   Past Medical History  Diagnosis Date  . Breast cancer 04/2010  . Hypertension     under control; has been on med. x 2 yrs.  . CHF (congestive heart failure) 2011  . Atrial flutter 2011    occurred with CHF; no current dysrhythmia  . COPD (chronic obstructive pulmonary disease)     denies SOB with daily activities  . Pneumonia 2011  . Scoliosis     since chemo  . Neuropathy of leg     since chemo; right leg  . Cough 08/31/2011  . Anemia   . Osteopenia 12/31/2011    Past Surgical History  Procedure Date  . Portacath placement 05/17/2010  . Mastectomy 04/20/2010    right  . Bronchoscopy 04/03/2010    with endobronchial ultrasound  . Port-a-cath removal 09/05/2011    Procedure: REMOVAL PORT-A-CATH;  Surgeon: Emelia Loron, MD;  Location: [facility];  Service: General;  Laterality: Left;    Current Outpatient Prescriptions  Medication Sig Dispense Refill  . alendronate (FOSAMAX) 35 MG tablet Take 1 tablet (35 mg total) by mouth every 7 (seven) days. Takes on Wednesday  12 tablet  0  . anastrozole (ARIMIDEX) 1 MG tablet Take 1 tablet (1 mg total) by mouth daily. AM  90 tablet  3  . aspirin 81 MG tablet Take 81 mg by mouth daily.       . Calcium Carbonate (CALCIUM 600 PO) Take by mouth 2 (two) times daily.       . Cholecalciferol (VITAMIN D PO) Take 1,000 Units by mouth daily.       Marland Kitchen lisinopril (PRINIVIL,ZESTRIL) 10 MG tablet Take 1 tablet (10 mg total) by mouth daily.  30 tablet  5    ALLERGIES:  is  allergic to pneumococcal vaccines and shellfish-derived products.  REVIEW OF SYSTEMS:  The rest of the 14-point review of system was negative.   Filed Vitals:   07/02/12 1051  BP: 135/76  Pulse: 72  Temp: 97.1 F (36.2 C)  Resp: 20   Wt Readings from Last 3 Encounters:  07/02/12 111 lb 1.6 oz (50.395 kg)  05/26/12 110 lb 1.9 oz (49.95 kg)  02/26/12 110 lb 9.6 oz (50.168 kg)   ECOG Performance status:  0-1  PHYSICAL EXAMINATION:   General: well-nourished in no acute distress. Eyes: no scleral icterus. ENT: There were no oropharyngeal lesions. Neck was without thyromegaly. Lymphatics: Negative cervical, supraclavicular or axillary adenopathy. Respiratory: lungs were clear bilaterally without wheezing or crackles. Cardiovascular: Regular rate and rhythm, S1/S2, without murmur, rub or gallop. There was no pedal edema. GI: abdomen was soft, flat, nontender, nondistended, without organomegaly. Muscoloskeletal: no spinal tenderness of palpation of vertebral spine. There was no tenderness to palpation of her right shoulder. Skin exam was without echymosis, petichae. Neuro exam notable for her ability to raise her right arm only to her shoulder level. Patient was able to get on and off exam table without assistance. Gait was normal. Patient was alerted and oriented. Attention was good. Language was appropriate. Mood was normal without depression. Speech was not pressured. Thought content was not tangential. Right mastectomy scar was well healed without lesions. Left breast exam was negative.     LABORATORY/RADIOLOGY DATA:  Lab Results  Component Value Date   WBC 3.8* 07/02/2012   HGB 11.5* 07/02/2012   HCT 35.5 07/02/2012   PLT 210 07/02/2012   GLUCOSE 80 07/02/2012   CHOL 178 06/03/2012   TRIG 75.0 06/03/2012   HDL 75.50 06/03/2012   LDLCALC 88 06/03/2012   ALKPHOS 46 07/02/2012   ALT 8 07/02/2012   AST 18 07/02/2012   NA 137 07/02/2012   K 5.2* 07/02/2012   CL 105 07/02/2012   CREATININE 0.9 07/02/2012   BUN 17.0 07/02/2012   CO2 27 07/02/2012   INR 1.12 07/15/2010    ASSESSMENT AND PLAN:   1. History of breast cancer. She is doing well on adjuvant Arimidex. She has no evidence of local or distal met. I advised her to continue adjuvant Arimidex for 5 years total.   2. Right arm tightness: improved compared to before.  She was able to raise it higher above her head than last time.    3. Osteopenia: Fosamax 35mg  PO weekly and Cal/Vit D bid.  4. History of atrial fibrillation. On ASA.  5. Hypertension, well-controlled on lisinopril per PCP.   6. History of congestive heart failure. She is euvolemic at this time. She is on lisinopril and per PCP.   7. Anemia:  She never had colon cancer screening.  Iron panel was normal in May 2013. I strongly urged her to discuss with her PCP to be referred to a GI service for evaluation of screening colonoscopy.  She is not enthused with the concept of colonoscopy.  I advised her that if she has colon cancer, it is mostly curable if caught early on coloscopy.    8. Anxiety:  No sign of major depression.  I do not think that she would benefit from antidepressant; however, I advised her to consult with her PCP for further evaluation.   9. History of smoking. She is only using electronic smokless cigarettes at this time.   10. Subcarinal node on PET pre therapy for breast cancer: f/u chest CT  in 2012 showed resolution of this node.  Will continue observation unless she picks up smoking again or has symptoms.   11. Surveillance: Mammogram and DEXA scan performed in August 2013. Mammogram due in August 2014. DEXA due August 2015.  12. Follow up in about 6 months.      The length of time of the face-to-face encounter was 25 minutes. More than 50% of time was spent counseling and coordination of care.

## 2012-07-02 NOTE — Telephone Encounter (Signed)
Gave patient appt for may 2014 lab and MD

## 2012-08-01 ENCOUNTER — Other Ambulatory Visit: Payer: Medicare Other | Admitting: Lab

## 2012-09-03 ENCOUNTER — Other Ambulatory Visit: Payer: Self-pay | Admitting: *Deleted

## 2012-09-03 DIAGNOSIS — C50919 Malignant neoplasm of unspecified site of unspecified female breast: Secondary | ICD-10-CM

## 2012-09-03 MED ORDER — LISINOPRIL 10 MG PO TABS: 10.0000 mg | ORAL_TABLET | Freq: Every day | ORAL | Status: DC

## 2012-09-03 MED ORDER — ANASTROZOLE 1 MG PO TABS: 1.0000 mg | ORAL_TABLET | Freq: Every day | ORAL | Status: DC

## 2012-09-03 NOTE — Telephone Encounter (Signed)
Fax Received. Refill Completed. Rachel Mcbride (R.M.A)   

## 2012-09-10 ENCOUNTER — Ambulatory Visit (INDEPENDENT_AMBULATORY_CARE_PROVIDER_SITE_OTHER)
Admission: RE | Admit: 2012-09-10 | Discharge: 2012-09-10 | Disposition: A | Discharge status: Home / Self Care | Payer: Medicare Other | Source: Ambulatory Visit | Attending: Pulmonary Disease | Admitting: Pulmonary Disease

## 2012-09-10 DIAGNOSIS — J984 Other disorders of lung: Secondary | ICD-10-CM | POA: Diagnosis not present

## 2012-09-15 ENCOUNTER — Encounter: Payer: Self-pay | Admitting: Pulmonary Disease

## 2012-09-15 ENCOUNTER — Ambulatory Visit (INDEPENDENT_AMBULATORY_CARE_PROVIDER_SITE_OTHER): Payer: Medicare Other | Admitting: Pulmonary Disease

## 2012-09-15 VITALS — BP 120/72 | HR 75 | Temp 98.2°F | Ht 64.0 in | Wt 113.8 lb

## 2012-09-15 DIAGNOSIS — J984 Other disorders of lung: Secondary | ICD-10-CM

## 2012-09-15 DIAGNOSIS — J4489 Other specified chronic obstructive pulmonary disease: Secondary | ICD-10-CM

## 2012-09-15 DIAGNOSIS — J449 Chronic obstructive pulmonary disease, unspecified: Secondary | ICD-10-CM

## 2012-09-15 DIAGNOSIS — R599 Enlarged lymph nodes, unspecified: Secondary | ICD-10-CM

## 2012-09-15 NOTE — Assessment & Plan Note (Signed)
Benign , calcifications noted

## 2012-09-15 NOTE — Patient Instructions (Addendum)
Your COPD is stable Nodule has resolved -no scans required

## 2012-09-15 NOTE — Assessment & Plan Note (Signed)
Gold stg 2 Dec '11 fev1 55% Stable, no meds for now Discussed signs of early bronchitis

## 2012-09-15 NOTE — Assessment & Plan Note (Signed)
No further FU required

## 2012-09-15 NOTE — Progress Notes (Signed)
  Subjective:    Patient ID: Rachel Mcbride, female    DOB: Sep 15, 1945, 67 y.o.   MRN: [id number]  HPI PCP - Jeannetta Nap  67/F , ex- heavy smoker > 50 Pyrs, for FU of COPD & subcarinal lymphadenopathy.  Dec 2011 Spirometry FEV1 at 55%, ratio at 74. Previous FEV1 8/11 at 43%.  She presented with a breast mass in 8/11, diagnosed asT2 N1 Mx lobular breast ca with right axillary LN involvement. PET-CT showed negative right cervical Ln, negative 5 mm & 10 mm nodules in the lung & 9x 14 mm subcarinal Ln with malignant range uptake although this showed a hint of calcification on the CT images - EBUS neg.   Underwent MRM, LNs pos -->ER , PR pos, Her-2 neu neg  Pneumovax 04/03/10  Admitted 12/1-12/8 for RML PNA. During her stay she developed a-flutter with cardilogy consult (Nahser). Echo showed EF 45-50%, mild LV reduction , BNP was elevated at 755.  02/15/11  quit smoking CT 7/12 >>Lymph nodes not enlarged.New 5 mm nodule in RUL , not noted in 1/13  09/15/2012   pt still uses the electronic cig. she has never had to use the proair Denies chest pain, orthopnea, hemoptysis, fever, n/v/d, headache.   CT 1/14 Resolution of the small nodular density in the right upper lobe noted in 7/12. No further follow-up is suggested. . New peripheral parenchymal and pleural scarring in the anterior lateral aspect of the right upper and middle lobes felt to be secondary to radiation therapy   Review of Systems neg for any significant sore throat, dysphagia, itching, sneezing, nasal congestion or excess/ purulent secretions, fever, chills, sweats, unintended wt loss, pleuritic or exertional cp, hempoptysis, orthopnea pnd or change in chronic leg swelling. Also denies presyncope, palpitations, heartburn, abdominal pain, nausea, vomiting, diarrhea or change in bowel or urinary habits, dysuria,hematuria, rash, arthralgias, visual complaints, headache, numbness weakness or ataxia.     Objective:   Physical Exam  Gen.  Pleasant, well-nourished, in no distress ENT - no lesions, no post nasal drip Neck: No JVD, no thyromegaly, no carotid bruits Lungs: no use of accessory muscles, no dullness to percussion, clear without rales or rhonchi  Cardiovascular: Rhythm regular, heart sounds  normal, no murmurs or gallops, no peripheral edema Musculoskeletal: No deformities, no cyanosis or clubbing         Assessment & Plan:

## 2012-10-30 DIAGNOSIS — H5210 Myopia, unspecified eye: Secondary | ICD-10-CM | POA: Diagnosis not present

## 2012-10-30 DIAGNOSIS — H2589 Other age-related cataract: Secondary | ICD-10-CM | POA: Diagnosis not present

## 2012-11-25 ENCOUNTER — Encounter: Payer: Self-pay | Admitting: Cardiovascular Disease

## 2012-11-25 ENCOUNTER — Ambulatory Visit (INDEPENDENT_AMBULATORY_CARE_PROVIDER_SITE_OTHER): Payer: Medicare Other | Admitting: Cardiovascular Disease

## 2012-11-25 VITALS — BP 114/62 | HR 76 | Ht 64.0 in | Wt 112.0 lb

## 2012-11-25 DIAGNOSIS — I509 Heart failure, unspecified: Secondary | ICD-10-CM

## 2012-11-25 LAB — BASIC METABOLIC PANEL
BUN: 24 mg/dL — ABNORMAL HIGH (ref 6–23)
Chloride: 101 mEq/L (ref 96–112)
Creatinine, Ser: 0.8 mg/dL (ref 0.4–1.2)
GFR: 71.63 mL/min (ref >60.00)
Glucose, Bld: 81 mg/dL (ref 70–99)
Potassium: 4.6 mEq/L (ref 3.5–5.1)

## 2012-11-25 NOTE — Assessment & Plan Note (Signed)
Rachel Mcbride has a hx of mild CHF diagnosed in 2011.  We have continued her Lisinopril.  She had a marginally elevated potassium level back in November, 2013 but she notes that she eats a banana once a day.  Her husband questioned whether or not she should stop the lisinopril. I would be inclined to continue at that she is doing so well. Her blood pressure is normal.  We'll see her again in 6 months. We'll check a basic metabolic profile that time. We'll also check an EKG.

## 2012-11-25 NOTE — Patient Instructions (Addendum)
Your physician has recommended you make the following change in your medication:   STOP  ASPIRIN  Your physician wants you to follow-up in: 6 MONTHS WITH EKG You will receive a reminder letter in the mail two months in advance. If you don't receive a letter, please call our office to schedule the follow-up appointment.  Your physician recommends that you return for lab work in: [employer]

## 2012-11-25 NOTE — Progress Notes (Signed)
Rachel Mcbride Date of Birth  09-03-1945 [facility] [address]    [address] [place], [place]  [address] [phone]  Fax  [phone]   Problems: 1. History breast cancer-status post chemotherapy 2. Atrial flutter - in the setting of post chemotherapy, pneumonia 3. COPD 4. Diastolic congestive heart failure  History of Present Illness:  67 year old female with a  history of atrial flutter. She also has a history of breast cancer.  She has received  chemotherapy and radiation.  SHe stopped smoking last year now smokes an electronic cigarette.  She has a history of COPD. she has had transient congestive heart failure but this seems to have resolved.   Her oxygen levels have been low but have been gradually improving.  She still has some generalized shortness of breath. She does not get any regular exercise. She denies any syncope or presyncope.  Because of her shortness breath we performed a stress test this past January. She was found to have no evidence of ischemia. She has normal left ventricular systolic function.  She was receiving chemotherapy. Her Port-A-Cath has now been removed.  She is now fairly active and is back to doing her normal pre-cancer activities.  She is walking some ( 600 feet to the mail box and back).  She is still having some problems with peripheral neuropathy.  She also has had worsened some degree of her scoliosis.   November 25, 2013:  Rachel Mcbride is doing well from a cardiac standpoint.  Slight fatigue.  She has been on ASA since having an episode of A-Flutter which was in the setting of post chemotherapy pneumonia - husband questions whether or not we can stop her cardiac meds.  She is not having any problems.    Current Outpatient Prescriptions on File Prior to Visit  Medication Sig Dispense Refill  . alendronate (FOSAMAX) 35 MG tablet Take 1 tablet (35 mg total) by mouth every 7 (seven) days. Takes on Wednesday  12 tablet  0  . anastrozole  (ARIMIDEX) 1 MG tablet Take 1 tablet (1 mg total) by mouth daily. AM  90 tablet  3  . aspirin 81 MG tablet Take 81 mg by mouth daily.       . Calcium Carbonate (CALCIUM 600 PO) Take by mouth 2 (two) times daily.       . Cholecalciferol (VITAMIN D PO) Take 1,000 Units by mouth daily.       Marland Kitchen lisinopril (PRINIVIL,ZESTRIL) 10 MG tablet Take 1 tablet (10 mg total) by mouth daily.  30 tablet  5   No current facility-administered medications on file prior to visit.    Allergies  Allergen Reactions  . Pneumococcal Vaccines Itching  . Shellfish-Derived Products Itching    Past Medical History  Diagnosis Date  . Breast cancer 04/2010  . Hypertension     under control; has been on med. x 2 yrs.  . CHF (congestive heart failure) 2011  . Atrial flutter 2011    occurred with CHF; no current dysrhythmia  . COPD (chronic obstructive pulmonary disease)     denies SOB with daily activities  . Pneumonia 2011  . Scoliosis     since chemo  . Neuropathy of leg     since chemo; right leg  . Cough 08/31/2011  . Anemia   . Osteopenia 12/31/2011    Past Surgical History  Procedure Laterality Date  . Portacath placement  05/17/2010  . Mastectomy  04/20/2010    right  .  Bronchoscopy  04/03/2010    with endobronchial ultrasound  . Port-a-cath removal  09/05/2011    Procedure: REMOVAL PORT-A-CATH;  Surgeon: Emelia Loron, MD;  [facility];  Service: General;  Laterality: Left;    History  Smoking status  . Former Smoker -- 2.00 packs/day for 50 years  . Types: Cigarettes  . Quit date: 07/14/2010  Smokeless tobacco  . Never Used    Comment: uses electronic cigarette    History  Alcohol Use  . 0.0 oz/week    Comment: 2-3 x/week    Family History  Problem Relation Age of Onset  . Heart attack Mother   . Stroke Mother     Reviw of Systems:  Reviewed in the HPI.  All other systems are negative.  Physical Exam: BP 114/62  Pulse 76  Ht 5\' 4"  (1.626 m)  Wt  112 lb (50.803 kg)  BMI 19.22 kg/m2 The patient is alert and oriented x 3.  The mood and affect are normal.   Skin: warm and dry.  Color is normal.    HEENT:   the sclera are nonicteric.  The mucous membranes are moist.  The carotids are 2+ without bruits.  There is no thyromegaly.  There is no JVD.    Lungs: clear.  The chest wall is non tender.    Heart: regular rate with a normal S1 and S2.  There are no murmurs, gallops, or rubs. The PMI is not displaced.     Abdomen: good bowel sounds.  There is no guarding or rebound.  There is no hepatosplenomegaly or tenderness.  There are no masses.   Extremities:  no clubbing, cyanosis, or edema.  The legs are without rashes.  The distal pulses are intact.   Neuro:  Cranial nerves II - XII are intact.  Motor and sensory functions are intact.    The gait is normal.  ECG:  Assessment / Plan:

## 2012-12-02 ENCOUNTER — Telehealth: Payer: Self-pay | Admitting: Oncology

## 2012-12-24 ENCOUNTER — Telehealth: Payer: Self-pay | Admitting: Oncology

## 2012-12-24 ENCOUNTER — Ambulatory Visit (HOSPITAL_BASED_OUTPATIENT_CLINIC_OR_DEPARTMENT_OTHER): Payer: Medicare Other | Admitting: Lab

## 2012-12-24 ENCOUNTER — Other Ambulatory Visit: Payer: Self-pay | Admitting: Oncology

## 2012-12-24 ENCOUNTER — Other Ambulatory Visit (HOSPITAL_BASED_OUTPATIENT_CLINIC_OR_DEPARTMENT_OTHER): Payer: Medicare Other | Admitting: Lab

## 2012-12-24 ENCOUNTER — Ambulatory Visit (HOSPITAL_BASED_OUTPATIENT_CLINIC_OR_DEPARTMENT_OTHER): Payer: Medicare Other | Admitting: Oncology

## 2012-12-24 VITALS — BP 118/62 | HR 77 | Temp 97.7°F | Resp 18 | Ht 64.0 in | Wt 111.5 lb

## 2012-12-24 DIAGNOSIS — D649 Anemia, unspecified: Secondary | ICD-10-CM

## 2012-12-24 DIAGNOSIS — C773 Secondary and unspecified malignant neoplasm of axilla and upper limb lymph nodes: Secondary | ICD-10-CM

## 2012-12-24 DIAGNOSIS — C50919 Malignant neoplasm of unspecified site of unspecified female breast: Secondary | ICD-10-CM

## 2012-12-24 DIAGNOSIS — C50419 Malignant neoplasm of upper-outer quadrant of unspecified female breast: Secondary | ICD-10-CM

## 2012-12-24 DIAGNOSIS — Z9011 Acquired absence of right breast and nipple: Secondary | ICD-10-CM

## 2012-12-24 DIAGNOSIS — M899 Disorder of bone, unspecified: Secondary | ICD-10-CM

## 2012-12-24 DIAGNOSIS — M949 Disorder of cartilage, unspecified: Secondary | ICD-10-CM

## 2012-12-24 DIAGNOSIS — Z1231 Encounter for screening mammogram for malignant neoplasm of breast: Secondary | ICD-10-CM

## 2012-12-24 LAB — CBC WITH DIFFERENTIAL/PLATELET
BASO%: 0.7 % (ref 0.0–2.0)
EOS%: 2.4 % (ref 0.0–7.0)
HCT: 31.9 % — ABNORMAL LOW (ref 34.8–46.6)
LYMPH%: 13.8 % — ABNORMAL LOW (ref 14.0–49.7)
MCH: 30.1 pg (ref 25.1–34.0)
MCHC: 32.9 g/dL (ref 31.5–36.0)
NEUT%: 73.7 % (ref 38.4–76.8)
Platelets: 189 10*3/uL (ref 145–400)
RBC: 3.49 10*6/uL — ABNORMAL LOW (ref 3.70–5.45)
WBC: 4.1 10*3/uL (ref 3.9–10.3)

## 2012-12-24 LAB — COMPREHENSIVE METABOLIC PANEL (CC13)
ALT: 7 U/L (ref 0–55)
BUN: 19.3 mg/dL (ref 7.0–26.0)
CO2: 26 mEq/L (ref 22–29)
Calcium: 9.1 mg/dL (ref 8.4–10.4)
Chloride: 104 mEq/L (ref 98–107)
Creatinine: 0.8 mg/dL (ref 0.6–1.1)
Glucose: 87 mg/dl (ref 70–99)

## 2012-12-24 NOTE — Patient Instructions (Addendum)
1.  Breast cancer history:  Continue hormonal therapy. 2.  Anemia:  Need colonoscopy.  Go back to the lab today for further work up.  3.  Return in 3 months for lab only to follow up anemia.  Return visit in about 6 months.

## 2012-12-24 NOTE — Progress Notes (Signed)
[facility]  Telephone:(336) [phone] Fax:(336) [phone]   OFFICE PROGRESS NOTE   Cc:  Kaleen Mask, MD   DIAGNOSES:  1. A pT2pN2aMX (stage IIIA) invasive ductal carcinoma measuring 2.4 cm, grade 2 out of 3, status post right mastectomy with axillary lymph node dissection on April 20, 2010, with negative surgical margins; 6 out of 17 lymph nodes positive, with extracapsular extension. There was lymphovascular invasion. ER 81%, PR 64%, Ki-67 of 16%, HER-2/neu by CISH was negative ratio of 1.46.  2. Subcarinal adenopathy positive on PET scan in 03/2010; however, EBUS FNA was negative. Follow up CT chest in 07/2010 showed stable calcified subcarinal node. She has been followed by Dr. Vassie Loll.   PAST THERAPY: s/p adjuvant chemotherapy with dose dense AC followed by weekly Taxol finished on 09/21/2010. She terminated Taxol prematurely due to neuropathy, fatigue.   CURRENT THERAPY: started Arimidex in May 2012.  INTERVAL HISTORY: Rachel Mcbride 67 y.o. female returns for regular follow up with her husband.  She feels stable.  She still drinks a few beers every 3 days.  She still smokes electronic cigarette.  She does not think that she can ever quit.  She denied right mastectomy scar problem.  She denied left breast pain/mass/skin thickening.  She still has baseline DOE due to her COPD.  She denied fever, headache, edema, visible bleeding. The rest of the 14-point review of system was negative.    Past Medical History  Diagnosis Date  . Breast cancer 04/2010  . Hypertension     under control; has been on med. x 2 yrs.  . CHF (congestive heart failure) 2011  . Atrial flutter 2011    occurred with CHF; no current dysrhythmia  . COPD (chronic obstructive pulmonary disease)     denies SOB with daily activities  . Pneumonia 2011  . Scoliosis     since chemo  . Neuropathy of leg     since chemo; right leg  . Cough 08/31/2011  . Anemia   . Osteopenia 12/31/2011     Past Surgical History  Procedure Laterality Date  . Portacath placement  05/17/2010  . Mastectomy  04/20/2010    right  . Bronchoscopy  04/03/2010    with endobronchial ultrasound  . Port-a-cath removal  09/05/2011    Procedure: REMOVAL PORT-A-CATH;  Surgeon: Emelia Loron, MD;  Location: [facility];  Service: General;  Laterality: Left;    Current Outpatient Prescriptions  Medication Sig Dispense Refill  . alendronate (FOSAMAX) 35 MG tablet Take 1 tablet (35 mg total) by mouth every 7 (seven) days. Takes on Wednesday  12 tablet  0  . anastrozole (ARIMIDEX) 1 MG tablet Take 1 tablet (1 mg total) by mouth daily. AM  90 tablet  3  . Calcium Carbonate (CALCIUM 600 PO) Take by mouth 2 (two) times daily.       . Cholecalciferol (VITAMIN D PO) Take 1,000 Units by mouth daily.       Marland Kitchen lisinopril (PRINIVIL,ZESTRIL) 10 MG tablet Take 1 tablet (10 mg total) by mouth daily.  30 tablet  5   No current facility-administered medications for this visit.    ALLERGIES:  is allergic to pneumococcal vaccines and shellfish-derived products.   Filed Vitals:   12/24/12 0924  BP: 118/62  Pulse: 77  Temp: 97.7 F (36.5 C)  Resp: 18   Wt Readings from Last 3 Encounters:  12/24/12 111 lb 8 oz (50.576 kg)  11/25/12 112 lb (50.803 kg)  09/15/12 113 lb 12.8 oz (51.619 kg)   ECOG Performance status: 0-1  PHYSICAL EXAMINATION:   General: thin-appearing woman, in no acute distress. Eyes: no scleral icterus. ENT: There were no oropharyngeal lesions. Neck was without thyromegaly. Lymphatics: Negative cervical, supraclavicular or axillary adenopathy. Respiratory: lungs were clear bilaterally without wheezing or crackles. Cardiovascular: Regular rate and rhythm, S1/S2, without murmur, rub or gallop. There was no pedal edema. GI: abdomen was soft, flat, nontender, nondistended, without organomegaly.  She deferred rectal exam.  Muscoloskeletal: no spinal tenderness of palpation of vertebral  spine. There was no tenderness to palpation of her right shoulder. Skin exam was without echymosis, petichae. Neuro exam was non focal. Patient was able to get on and off exam table without assistance. Gait was normal. Patient was alert and oriented. Attention was good. Language was appropriate. Mood was normal without depression. Speech was not pressured. Thought content was not tangential. Right mastectomy scar was well healed without lesions. Left breast exam was negative.     LABORATORY/RADIOLOGY DATA:  Lab Results  Component Value Date   WBC 4.1 12/24/2012   HGB 10.5* 12/24/2012   HCT 31.9* 12/24/2012   PLT 189 12/24/2012   GLUCOSE 87 12/24/2012   CHOL 178 06/03/2012   TRIG 75.0 06/03/2012   HDL 75.50 06/03/2012   LDLCALC 88 06/03/2012   ALKPHOS 48 12/24/2012   ALT 7 12/24/2012   AST 15 12/24/2012   NA 139 12/24/2012   K 4.7 12/24/2012   CL 104 12/24/2012   CREATININE 0.8 12/24/2012   BUN 19.3 12/24/2012   CO2 26 12/24/2012   INR 1.12 07/15/2010    ASSESSMENT AND PLAN:   1.   History of breast cancer. She is doing well on adjuvant Arimidex. She is in remission.  Next mammogram is due in 03/2013.   2.  Osteopenia: Fosamax 35mg  PO weekly and Cal/Vit D bid.  Next bone density is due in 03/2014.  3. History of atrial fibrillation. She stopped Ditiazem.  Her HR is within normal limit.  4. Hypertension, well-controlled on lisinopril per PCP.  5. History of congestive heart failure. She is euvolemic at this time. She is on lisinopril per PCP. 6. Anemia:  She never had colon cancer screening. She planned to see Dr. Russella Dar soon for colonoscopy.  I sent for iron panel and myeloma panel.  7. History of smoking. She is only using electronic smokless cigarettes at this time.  I advised her that new data are questioning the safely of electronic cigarettes.  However, she cannot do without it.  8. Follow up:  Lab only appointment in about 3 months.  Return visit in about 6 months.       I  informed  Mrs. Wahlert and her husband that I am leaving the practice.  [facility] will [facility] for her to see a new provider when she returns.    The length of time of the face-to-face encounter was 20 minutes. More than 50% of time was spent counseling and coordination of care.

## 2012-12-24 NOTE — Telephone Encounter (Signed)
Gave pt appt for labs, MD visit and mammogram on August and November 2014, pt sent to labs today

## 2012-12-26 LAB — PROTEIN ELECTROPHORESIS, SERUM
Alpha-1-Globulin: 3.6 % (ref 2.9–4.9)
Beta 2: 4.7 % (ref 3.2–6.5)
Gamma Globulin: 14.9 % (ref 11.1–18.8)

## 2012-12-26 LAB — IRON AND TIBC
%SAT: 25 % (ref 20–55)
Iron: 65 ug/dL (ref 42–145)
TIBC: 258 ug/dL (ref 250–470)
UIBC: 193 ug/dL (ref 125–400)

## 2012-12-29 ENCOUNTER — Other Ambulatory Visit: Payer: Medicare Other | Admitting: Lab

## 2012-12-29 ENCOUNTER — Ambulatory Visit: Payer: Medicare Other | Admitting: Oncology

## 2013-01-12 ENCOUNTER — Encounter: Payer: Self-pay | Admitting: Gastroenterology

## 2013-01-12 ENCOUNTER — Ambulatory Visit (INDEPENDENT_AMBULATORY_CARE_PROVIDER_SITE_OTHER): Payer: Medicare Other | Admitting: Gastroenterology

## 2013-01-12 VITALS — BP 120/62 | HR 80 | Ht 60.25 in | Wt 113.1 lb

## 2013-01-12 DIAGNOSIS — Z1211 Encounter for screening for malignant neoplasm of colon: Secondary | ICD-10-CM

## 2013-01-12 DIAGNOSIS — D649 Anemia, unspecified: Secondary | ICD-10-CM

## 2013-01-12 MED ORDER — MOVIPREP 100 G PO SOLR: 1.0000 | Freq: Once | ORAL | Status: DC

## 2013-01-12 NOTE — Progress Notes (Signed)
History of Present Illness: This is a 67 year old female with a history of breast cancer has a normocytic anemia with a hemoglobin 10.5. Two years ago she had iron studies consistent with iron deficiency. Her most recent iron studies were normal. She's been treated by Dr. Gaylyn Rong for breast cancer. She's never had colonoscopy. She notes occasional abdominal bloating. Denies weight loss, abdominal pain, constipation, diarrhea, change in stool caliber, melena, hematochezia, nausea, vomiting, dysphagia, reflux symptoms, chest pain.  Review of Systems: Pertinent positive and negative review of systems were noted in the above HPI section. All other review of systems were otherwise negative.  Current Medications, Allergies, Past Medical History, Past Surgical History, Family History and Social History were reviewed in [employer] record.  Physical Exam: General: Well developed , well nourished, no acute distress Head: Normocephalic and atraumatic Eyes:  sclerae anicteric, EOMI Ears: Normal auditory acuity Mouth: No deformity or lesions Neck: Supple, no masses or thyromegaly Lungs: Clear throughout to auscultation Heart: Regular rate and rhythm; no murmurs, rubs or bruits Abdomen: Soft, non tender and non distended. No masses, hepatosplenomegaly or hernias noted. Normal Bowel sounds Rectal: Deferred to colonoscopy Musculoskeletal: Symmetrical with no gross deformities  Skin: No lesions on visible extremities Pulses:  Normal pulses noted Extremities: No clubbing, cyanosis, edema or deformities noted Neurological: Alert oriented x 4, grossly nonfocal Cervical Nodes:  No significant cervical adenopathy Inguinal Nodes: No significant inguinal adenopathy Psychological:  Alert and cooperative. Normal mood and affect  Assessment and Recommendations:  1. Colorectal cancer screening, average risk. Schedule colonoscopy. The risks, benefits, and alternatives to colonoscopy with possible  biopsy and possible polypectomy were discussed with the patient and they consent to proceed. Her COPD and congestive heart failure are well-controlled and stable.  2. Normocytic anemia, recent iron studies did not show iron deficiency. Further evaluation per Dr. Gaylyn Rong.

## 2013-01-12 NOTE — Patient Instructions (Addendum)
You have been scheduled for a colonoscopy with propofol. Please follow written instructions given to you at your visit today.  Please pick up your prep kit at the pharmacy within the next 1-3 days. If you use inhalers (even only as needed), please bring them with you on the day of your procedure. Your physician has requested that you go to www.startemmi.com and enter the access code given to you at your visit today. This web site gives a general overview about your procedure. However, you should still follow specific instructions given to you by our office regarding your preparation for the procedure. CC:  Elizebeth Koller MD

## 2013-02-17 ENCOUNTER — Ambulatory Visit (AMBULATORY_SURGERY_CENTER): Payer: Medicare Other | Admitting: Gastroenterology

## 2013-02-17 ENCOUNTER — Encounter: Payer: Self-pay | Admitting: Gastroenterology

## 2013-02-17 VITALS — BP 136/67 | HR 64 | Temp 98.4°F | Resp 18 | Ht 60.0 in | Wt 113.0 lb

## 2013-02-17 DIAGNOSIS — Z1211 Encounter for screening for malignant neoplasm of colon: Secondary | ICD-10-CM

## 2013-02-17 MED ORDER — SODIUM CHLORIDE 0.9 % IV SOLN: 500.0000 mL | INTRAVENOUS | Status: DC

## 2013-02-17 NOTE — Op Note (Signed)
[facility] [address] [place], [address]   COLONOSCOPY PROCEDURE REPORT  PATIENT: Rachel Mcbride, Rachel Mcbride  MR#: [id number] BIRTHDATE: [birth year] , 67  yrs. old GENDER: Female ENDOSCOPIST: Meryl Dare, MD, [facility] REFERRED XB:JYNW Gaylyn Rong, M.D. PROCEDURE DATE:  02/17/2013 PROCEDURE:   Colonoscopy, screening First Screening Colonoscopy - Avg.  risk and is 50 yrs.  old or older Yes.  Prior Negative Screening - Now for repeat screening. N/A  History of Adenoma - Now for follow-up colonoscopy & has been > or = to 3 yrs.  N/A  Polyps Removed Today? No.  Recommend repeat exam, <10 yrs? No. ASA CLASS:   Class II INDICATIONS:average risk screening. MEDICATIONS: MAC sedation, administered by CRNA and propofol (Diprivan) 150mg  IV DESCRIPTION OF PROCEDURE:   After the risks benefits and alternatives of the procedure were thoroughly explained, informed consent was obtained.  A digital rectal exam revealed no abnormalities of the rectum.   The LB [id number]  endoscope was introduced through the anus and advanced to the cecum, which was identified by both the appendix and ileocecal valve. No adverse events experienced.   The quality of the prep was good, using MoviPrep  The instrument was then slowly withdrawn as the colon was fully examined.  COLON FINDINGS: Mild diverticulosis was noted in the sigmoid colon. The colon was otherwise normal.  There was no diverticulosis, inflammation, polyps or cancers unless previously stated. Retroflexed views revealed moderate internal hemorrhoids. The time to cecum=2 minutes 57 seconds.  Withdrawal time=7 minutes 25 seconds.  The scope was withdrawn and the procedure completed.  COMPLICATIONS: There were no complications.  ENDOSCOPIC IMPRESSION: 1.   Mild diverticulosis was noted in the sigmoid colon 2.   Moderate internal hemorrhoids  RECOMMENDATIONS: 1.  High fiber diet with liberal fluid intake. 2.  Continue current colorectal  screening recommendations for "routine risk" patients with a repeat colonoscopy in 10 years.  eSigned:  Meryl Dare, MD, [facility] 02/17/2013 3:19 PM

## 2013-02-17 NOTE — Patient Instructions (Addendum)

## 2013-02-17 NOTE — Progress Notes (Signed)
Patient did not have preoperative order for IV antibiotic SSI prophylaxis. (G8918)  Patient did not experience any of the following events: a burn prior to discharge; a fall within the facility; wrong site/side/patient/procedure/implant event; or a hospital transfer or hospital admission upon discharge from the facility. (G8907)  

## 2013-02-18 ENCOUNTER — Telehealth: Payer: Self-pay | Admitting: *Deleted

## 2013-02-18 NOTE — Telephone Encounter (Signed)
  Follow up Call-  Call back number 02/17/2013  Post procedure Call Back phone  # [phone]  Permission to leave phone message Yes     Patient questions:  Do you have a fever, pain , or abdominal swelling? no Pain Score  0 *  Have you tolerated food without any problems? yes  Have you been able to return to your normal activities? yes  Do you have any questions about your discharge instructions: Diet   no Medications  no Follow up visit  no  Do you have questions or concerns about your Care? yes  Actions: * If pain score is 4 or above: No action needed, pain <4. Husband stated he wanted to bring it to our attention that his wife was not to have Bp to right arm and when pt was brought into recovery she had a cuff on her right arm and husband was a little upset about this.  He stated that she did have a sign hanging on the bed but the cuff was present any way. It was removed and replaced else where by there recovery nurse. He said we just needed to know. ewm

## 2013-02-27 ENCOUNTER — Other Ambulatory Visit: Payer: Self-pay | Admitting: Medical Oncology

## 2013-02-27 DIAGNOSIS — C50919 Malignant neoplasm of unspecified site of unspecified female breast: Secondary | ICD-10-CM

## 2013-02-27 MED ORDER — ALENDRONATE SODIUM 35 MG PO TABS: 35.0000 mg | ORAL_TABLET | ORAL | Status: DC

## 2013-02-27 NOTE — Telephone Encounter (Signed)
electronically refilled alendronate.

## 2013-03-26 ENCOUNTER — Other Ambulatory Visit (HOSPITAL_BASED_OUTPATIENT_CLINIC_OR_DEPARTMENT_OTHER): Payer: Medicare Other | Admitting: Lab

## 2013-03-26 DIAGNOSIS — D649 Anemia, unspecified: Secondary | ICD-10-CM | POA: Diagnosis not present

## 2013-03-26 DIAGNOSIS — C50919 Malignant neoplasm of unspecified site of unspecified female breast: Secondary | ICD-10-CM | POA: Diagnosis not present

## 2013-03-26 LAB — BASIC METABOLIC PANEL (CC13)
BUN: 15.1 mg/dL (ref 7.0–26.0)
CO2: 26 mEq/L (ref 22–29)
Chloride: 104 mEq/L (ref 98–109)
Creatinine: 0.8 mg/dL (ref 0.6–1.1)

## 2013-03-26 LAB — CBC WITH DIFFERENTIAL/PLATELET
Basophils Absolute: 0.1 10*3/uL (ref 0.0–0.1)
Eosinophils Absolute: 0.1 10*3/uL (ref 0.0–0.5)
HCT: 33.3 % — ABNORMAL LOW (ref 34.8–46.6)
HGB: 11.1 g/dL — ABNORMAL LOW (ref 11.6–15.9)
MONO#: 0.6 10*3/uL (ref 0.1–0.9)
NEUT#: 5.2 10*3/uL (ref 1.5–6.5)
NEUT%: 80.3 % — ABNORMAL HIGH (ref 38.4–76.8)
WBC: 6.4 10*3/uL (ref 3.9–10.3)
lymph#: 0.6 10*3/uL — ABNORMAL LOW (ref 0.9–3.3)

## 2013-03-27 ENCOUNTER — Telehealth: Payer: Self-pay | Admitting: *Deleted

## 2013-03-27 NOTE — Telephone Encounter (Signed)
Left VM for pt informing of lab results stable and to return call if any questions or want more details.

## 2013-03-27 NOTE — Telephone Encounter (Signed)
Message copied by Wende Mott on Fri Mar 27, 2013  5:00 PM ------      Message from: Myrtis Ser      Created: Fri Mar 27, 2013  2:35 PM       Please call pt. Anemia is stable. Recommend continued observation. Keep f/u as scheduled. ------

## 2013-03-30 ENCOUNTER — Telehealth: Payer: Self-pay | Admitting: Medical Oncology

## 2013-03-30 NOTE — Telephone Encounter (Signed)
Pt called requesting her Hgb results. I gave her the results and stressed it has improved since her last labs 12/24/12. I verified she has her appointments for 06/26/13. She voiced understanding. I asked her to call if any problems or concerns.

## 2013-04-14 ENCOUNTER — Ambulatory Visit
Admission: RE | Admit: 2013-04-14 | Discharge: 2013-04-14 | Disposition: A | Discharge status: Home / Self Care | Payer: Medicare Other | Source: Ambulatory Visit | Attending: Oncology | Admitting: Oncology

## 2013-04-14 DIAGNOSIS — Z1231 Encounter for screening mammogram for malignant neoplasm of breast: Secondary | ICD-10-CM

## 2013-04-14 DIAGNOSIS — Z9011 Acquired absence of right breast and nipple: Secondary | ICD-10-CM

## 2013-05-14 ENCOUNTER — Other Ambulatory Visit: Payer: Self-pay

## 2013-05-14 MED ORDER — LISINOPRIL 10 MG PO TABS: 10.0000 mg | ORAL_TABLET | Freq: Every day | ORAL | Status: DC

## 2013-05-20 ENCOUNTER — Other Ambulatory Visit: Payer: Self-pay | Admitting: Hematology and Oncology

## 2013-05-20 ENCOUNTER — Other Ambulatory Visit: Payer: Self-pay | Admitting: *Deleted

## 2013-05-20 ENCOUNTER — Telehealth: Payer: Self-pay | Admitting: *Deleted

## 2013-05-20 NOTE — Telephone Encounter (Signed)
Refill request for Fosamax to "On call" MD for approval till 06/26/13 office visit with covering provider.

## 2013-05-20 NOTE — Telephone Encounter (Signed)
Pt requests Refill on Fosamax, 35 mg, once weekly.

## 2013-05-22 ENCOUNTER — Telehealth: Payer: Self-pay | Admitting: *Deleted

## 2013-05-22 ENCOUNTER — Telehealth: Payer: Self-pay

## 2013-05-22 DIAGNOSIS — C50919 Malignant neoplasm of unspecified site of unspecified female breast: Secondary | ICD-10-CM

## 2013-05-22 MED ORDER — ALENDRONATE SODIUM 35 MG PO TABS: 35.0000 mg | ORAL_TABLET | ORAL | Status: DC

## 2013-05-22 NOTE — Telephone Encounter (Signed)
HUSBAND WAS CALLING FOR WIFE TO GIVE [place] THE NEW MAIL IN FOR HER RX. WHICH IS [facility] [employer]. [employer] WILL BE FAXING A FORM FOR [place] TO FILL OUT

## 2013-05-22 NOTE — Telephone Encounter (Signed)
Pt requesting refill on her Fosamax.

## 2013-05-22 NOTE — Telephone Encounter (Signed)
Yes, please with 3 refills

## 2013-05-22 NOTE — Telephone Encounter (Signed)
Refill sent to [facility] and pt notified.

## 2013-05-26 ENCOUNTER — Ambulatory Visit (INDEPENDENT_AMBULATORY_CARE_PROVIDER_SITE_OTHER): Payer: Medicare Other | Admitting: Cardiovascular Disease

## 2013-05-26 ENCOUNTER — Other Ambulatory Visit: Payer: Self-pay | Admitting: *Deleted

## 2013-05-26 ENCOUNTER — Encounter: Payer: Self-pay | Admitting: Cardiovascular Disease

## 2013-05-26 VITALS — BP 137/61 | HR 68 | Ht 60.0 in | Wt 114.0 lb

## 2013-05-26 DIAGNOSIS — I4892 Unspecified atrial flutter: Secondary | ICD-10-CM

## 2013-05-26 DIAGNOSIS — C50919 Malignant neoplasm of unspecified site of unspecified female breast: Secondary | ICD-10-CM

## 2013-05-26 MED ORDER — ALENDRONATE SODIUM 35 MG PO TABS: 35.0000 mg | ORAL_TABLET | ORAL | Status: DC

## 2013-05-26 MED ORDER — ANASTROZOLE 1 MG PO TABS: 1.0000 mg | ORAL_TABLET | Freq: Every day | ORAL | Status: DC

## 2013-05-26 NOTE — Assessment & Plan Note (Signed)
She is breathing OK - no dyspnea.

## 2013-05-26 NOTE — Progress Notes (Signed)
Rachel Mcbride Date of Birth  1945/11/25 [facility] [address]    [address] [place], [place]  [address] [phone]  Fax  [phone]   Problems: 1. History breast cancer-status post chemotherapy 2. Atrial flutter - in the setting of post chemotherapy, pneumonia 3. COPD 4. Diastolic congestive heart failure  History of Present Illness:  67 year old female with a  history of atrial flutter. She also has a history of breast cancer.  She has received  chemotherapy and radiation.  SHe stopped smoking last year now smokes an electronic cigarette.  She has a history of COPD. she has had transient congestive heart failure but this seems to have resolved.   Her oxygen levels have been low but have been gradually improving.  She still has some generalized shortness of breath. She does not get any regular exercise. She denies any syncope or presyncope.  Because of her shortness breath we performed a stress test this past January. She was found to have no evidence of ischemia. She has normal left ventricular systolic function.  She was receiving chemotherapy. Her Port-A-Cath has now been removed.  She is now fairly active and is back to doing her normal pre-cancer activities.  She is walking some ( 600 feet to the mail box and back).  She is still having some problems with peripheral neuropathy.  She also has had worsened some degree of her scoliosis.   November 25, 2013:  Rachel Mcbride is doing well from a cardiac standpoint.  Slight fatigue.  She has been on ASA since having an episode of A-Flutter which was in the setting of post chemotherapy pneumonia - husband questions whether or not we can stop her cardiac meds.  She is not having any problems.  Oct. 14, 2014:  Rachel Mcbride is doing well - still has neuropathy issues.  Fatigue . No papitations.  She has started back on her ASA.      Current Outpatient Prescriptions on File Prior to Visit  Medication Sig Dispense Refill  . aspirin 81  MG tablet Take 81 mg by mouth daily.      . Calcium Carbonate (CALCIUM 600 PO) Take by mouth 2 (two) times daily.       . Cholecalciferol (VITAMIN D PO) Take 1,000 Units by mouth daily.       Marland Kitchen lisinopril (PRINIVIL,ZESTRIL) 10 MG tablet Take 1 tablet (10 mg total) by mouth daily.  30 tablet  5   No current facility-administered medications on file prior to visit.    Allergies  Allergen Reactions  . Pneumococcal Vaccines Itching  . Shellfish-Derived Products Itching    Past Medical History  Diagnosis Date  . Breast cancer 04/2010  . Hypertension     under control; has been on med. x 2 yrs.  . CHF (congestive heart failure) 2011  . Atrial flutter 2011    occurred with CHF; no current dysrhythmia  . COPD (chronic obstructive pulmonary disease)     denies SOB with daily activities  . Pneumonia 2011  . Scoliosis     since chemo  . Neuropathy of leg     since chemo; right leg  . Cough 08/31/2011  . Anemia   . Osteopenia 12/31/2011  . Cataract     bilateral    Past Surgical History  Procedure Laterality Date  . Portacath placement  05/17/2010  . Mastectomy  04/20/2010    right  . Bronchoscopy  04/03/2010    with endobronchial ultrasound  . Port-a-cath removal  09/05/2011    Procedure: REMOVAL PORT-A-CATH;  Surgeon: Emelia Loron, MD;  Location: [facility];  Service: General;  Laterality: Left;    History  Smoking status  . Former Smoker -- 2.00 packs/day for 50 years  . Types: Cigarettes  . Quit date: 07/14/2010  Smokeless tobacco  . Never Used    Comment: uses electronic cigarette    History  Alcohol Use  . 7.2 oz/week  . 12 Cans of beer per week    Comment: 2-3 x/week    Family History  Problem Relation Age of Onset  . Heart attack Mother   . Stroke Mother   . Heart disease Sister   . Cancer Sister     ? mouth    Reviw of Systems:  Reviewed in the HPI.  All other systems are negative.  Physical Exam: BP 137/61  Pulse 68  Ht 5'  (1.524 m)  Wt 114 lb (51.71 kg)  BMI 22.26 kg/m2 The patient is alert and oriented x 3.  The mood and affect are normal.   Skin: warm and dry.  Color is normal.    HEENT:   the sclera are nonicteric.  The mucous membranes are moist.  The carotids are 2+ without bruits.  There is no thyromegaly.  There is no JVD.    Lungs: clear.  The chest wall is non tender.    Heart: regular rate with a normal S1 and S2.  There are no murmurs, gallops, or rubs. The PMI is not displaced.     Abdomen: good bowel sounds.  There is no guarding or rebound.  There is no hepatosplenomegaly or tenderness.  There are no masses.   Extremities:  no clubbing, cyanosis, or edema.  The legs are without rashes.  The distal pulses are intact.   Neuro:  Cranial nerves II - XII are intact.  Motor and sensory functions are intact.    The gait is normal.  ECG: Oct. 14, 2014:  NSR at 60.  Normal ECG Assessment / Plan:

## 2013-05-26 NOTE — Telephone Encounter (Signed)
Faxed Armidex and Fosamax prescriptions to [facility]

## 2013-05-26 NOTE — Patient Instructions (Signed)
Your physician wants you to follow-up in: 1 year  You will receive a reminder letter in the mail two months in advance. If you don't receive a letter, please call our office to schedule the follow-up appointment.  Your physician recommends that you continue on your current medications as directed. Please refer to the Current Medication list given to you today.  

## 2013-05-29 ENCOUNTER — Other Ambulatory Visit: Payer: Self-pay | Admitting: *Deleted

## 2013-05-29 MED ORDER — LISINOPRIL 10 MG PO TABS: 10.0000 mg | ORAL_TABLET | Freq: Every day | ORAL | Status: DC

## 2013-06-11 ENCOUNTER — Telehealth: Payer: Self-pay | Admitting: Hematology and Oncology

## 2013-06-11 NOTE — Telephone Encounter (Signed)
pt aware of 11/14 appts shh

## 2013-06-24 ENCOUNTER — Telehealth: Payer: Self-pay | Admitting: Hematology and Oncology

## 2013-06-24 NOTE — Telephone Encounter (Signed)
Talked to the husband and he was not happy due to appt been changed before, tried to explain to him that Dr. Bertis Ruddy has an urgent meeting ,pt ' husband will express his displleasure to MD when they get here

## 2013-06-25 ENCOUNTER — Other Ambulatory Visit: Payer: Self-pay | Admitting: Hematology and Oncology

## 2013-06-25 DIAGNOSIS — D649 Anemia, unspecified: Secondary | ICD-10-CM

## 2013-06-25 DIAGNOSIS — C50919 Malignant neoplasm of unspecified site of unspecified female breast: Secondary | ICD-10-CM

## 2013-06-26 ENCOUNTER — Ambulatory Visit (HOSPITAL_BASED_OUTPATIENT_CLINIC_OR_DEPARTMENT_OTHER): Payer: Medicare Other | Admitting: Hematology and Oncology

## 2013-06-26 ENCOUNTER — Encounter (INDEPENDENT_AMBULATORY_CARE_PROVIDER_SITE_OTHER): Payer: Self-pay

## 2013-06-26 ENCOUNTER — Ambulatory Visit: Payer: Medicare Other | Admitting: Hematology and Oncology

## 2013-06-26 ENCOUNTER — Other Ambulatory Visit (HOSPITAL_BASED_OUTPATIENT_CLINIC_OR_DEPARTMENT_OTHER): Payer: Medicare Other | Admitting: Lab

## 2013-06-26 ENCOUNTER — Telehealth: Payer: Self-pay | Admitting: Hematology and Oncology

## 2013-06-26 ENCOUNTER — Other Ambulatory Visit: Payer: Medicare Other | Admitting: Lab

## 2013-06-26 VITALS — BP 144/81 | HR 72 | Temp 96.6°F | Resp 18 | Ht 60.0 in | Wt 115.5 lb

## 2013-06-26 DIAGNOSIS — D649 Anemia, unspecified: Secondary | ICD-10-CM | POA: Diagnosis not present

## 2013-06-26 DIAGNOSIS — C50919 Malignant neoplasm of unspecified site of unspecified female breast: Secondary | ICD-10-CM

## 2013-06-26 DIAGNOSIS — G609 Hereditary and idiopathic neuropathy, unspecified: Secondary | ICD-10-CM | POA: Diagnosis not present

## 2013-06-26 DIAGNOSIS — C50419 Malignant neoplasm of upper-outer quadrant of unspecified female breast: Secondary | ICD-10-CM

## 2013-06-26 DIAGNOSIS — D72819 Decreased white blood cell count, unspecified: Secondary | ICD-10-CM | POA: Diagnosis not present

## 2013-06-26 DIAGNOSIS — M899 Disorder of bone, unspecified: Secondary | ICD-10-CM | POA: Diagnosis not present

## 2013-06-26 DIAGNOSIS — C773 Secondary and unspecified malignant neoplasm of axilla and upper limb lymph nodes: Secondary | ICD-10-CM

## 2013-06-26 DIAGNOSIS — M8580 Other specified disorders of bone density and structure, unspecified site: Secondary | ICD-10-CM

## 2013-06-26 DIAGNOSIS — C50911 Malignant neoplasm of unspecified site of right female breast: Secondary | ICD-10-CM

## 2013-06-26 LAB — COMPREHENSIVE METABOLIC PANEL (CC13)
Albumin: 3.9 g/dL (ref 3.5–5.0)
Anion Gap: 9 mEq/L (ref 3–11)
BUN: 16 mg/dL (ref 7.0–26.0)
CO2: 25 mEq/L (ref 22–29)
Calcium: 10.2 mg/dL (ref 8.4–10.4)
Chloride: 105 mEq/L (ref 98–109)
Glucose: 85 mg/dl (ref 70–140)
Potassium: 4.6 mEq/L (ref 3.5–5.1)
Sodium: 139 mEq/L (ref 136–145)
Total Protein: 7.5 g/dL (ref 6.4–8.3)

## 2013-06-26 LAB — CBC & DIFF AND RETIC
BASO%: 0.8 % (ref 0.0–2.0)
Eosinophils Absolute: 0.2 10*3/uL (ref 0.0–0.5)
Immature Retic Fract: 8.3 % (ref 1.60–10.00)
MCHC: 32.1 g/dL (ref 31.5–36.0)
MONO#: 0.3 10*3/uL (ref 0.1–0.9)
NEUT#: 2.4 10*3/uL (ref 1.5–6.5)
NEUT%: 64.4 % (ref 38.4–76.8)
RBC: 3.69 10*6/uL — ABNORMAL LOW (ref 3.70–5.45)
RDW: 12.9 % (ref 11.2–14.5)
Retic %: 1.28 % (ref 0.70–2.10)
Retic Ct Abs: 47.23 10*3/uL (ref 33.70–90.70)
WBC: 3.8 10*3/uL — ABNORMAL LOW (ref 3.9–10.3)
lymph#: 0.8 10*3/uL — ABNORMAL LOW (ref 0.9–3.3)

## 2013-06-26 LAB — SEDIMENTATION RATE: Sed Rate: 32 mm/hr — ABNORMAL HIGH (ref 0–22)

## 2013-06-26 NOTE — Progress Notes (Signed)
[facility] OFFICE PROGRESS NOTE  Patient Care Team: Kaleen Mask, MD as PCP - General (Family Medicine) Emelia Loron, MD (General Surgery) Billie Lade, MD (Radiation Oncology) Oretha Milch, MD (Pulmonary Disease) Artis Delay, MD as Consulting Physician (Hematology and Oncology)  DIAGNOSIS: Stage III breast cancer for further management  SUMMARY OF ONCOLOGIC HISTORY: 1. A [id number] (stage IIIA) invasive ductal carcinoma measuring 2.4 cm, grade 2 out of 3, status post right mastectomy with axillary lymph node dissection on April 20, 2010, with negative surgical margins; 6 out of 17 lymph nodes positive, with extracapsular extension. There was lymphovascular invasion. ER 81%, PR 64%, Ki-67 of 16%, HER-2/neu by CISH was negative ratio of 1.46.  2. Subcarinal adenopathy positive on PET scan in 03/2010; however, EBUS FNA was negative. Follow up CT chest in 07/2010 showed stable calcified subcarinal node. She has been followed by Dr. Vassie Loll.   PAST THERAPY: s/p adjuvant chemotherapy with dose dense AC followed by weekly Taxol finished on 09/21/2010. She terminated Taxol prematurely due to neuropathy, fatigue.   CURRENT THERAPY: started Arimidex in May 2012.  INTERVAL HISTORY: Rachel Mcbride 67 y.o. female returns for further followup. The patient had mild fatigue. She still had intermittent peripheral neuropathy that comes and goes. She denies any palpable lumps. She denies any recent fever, chills, night sweats or abnormal weight loss Denies any mild just, arthralgias, hot flashes, mood swings or depression  I have reviewed the past medical history, past surgical history, social history and family history with the patient and they are unchanged from previous note.  ALLERGIES:  is allergic to pneumococcal vaccines and shellfish-derived products.  MEDICATIONS:  Current Outpatient Prescriptions  Medication Sig Dispense Refill  . alendronate (FOSAMAX) 35 MG  tablet Take 1 tablet (35 mg total) by mouth every 7 (seven) days.  12 tablet  3  . anastrozole (ARIMIDEX) 1 MG tablet Take 1 tablet (1 mg total) by mouth daily. AM  90 tablet  3  . aspirin 81 MG tablet Take 81 mg by mouth daily.      . Calcium Carbonate (CALCIUM 600 PO) Take by mouth 2 (two) times daily.       . Cholecalciferol (VITAMIN D PO) Take 1,000 Units by mouth daily.       Marland Kitchen lisinopril (PRINIVIL,ZESTRIL) 10 MG tablet Take 1 tablet (10 mg total) by mouth daily.  90 tablet  2   No current facility-administered medications for this visit.    REVIEW OF SYSTEMS:   Constitutional: Denies fevers, chills or abnormal weight loss Eyes: Denies blurriness of vision Ears, nose, mouth, throat, and face: Denies mucositis or sore throat Respiratory: Denies cough, dyspnea or wheezes Cardiovascular: Denies palpitation, chest discomfort or lower extremity swelling Gastrointestinal:  Denies nausea, heartburn or change in bowel habits Skin: Denies abnormal skin rashes Lymphatics: Denies new lymphadenopathy or easy bruising Neurological:Denies numbness, tingling or new weaknesses Behavioral/Psych: Mood is stable, no new changes  All other systems were reviewed with the patient and are negative.  PHYSICAL EXAMINATION: ECOG PERFORMANCE STATUS: 0 - Asymptomatic  Filed Vitals:   06/26/13 1040  BP: 144/81  Pulse: 72  Temp: 96.6 F (35.9 C)  Resp: 18   Filed Weights   06/26/13 1040  Weight: 115 lb 8 oz (52.39 kg)    GENERAL:alert, no distress and comfortable SKIN: skin color, texture, turgor are normal, no rashes or significant lesions EYES: normal, Conjunctiva are pink and non-injected, sclera clear OROPHARYNX:no exudate, no erythema and lips,  buccal mucosa, and tongue normal  NECK: supple, thyroid normal size, non-tender, without nodularity LYMPH:  no palpable lymphadenopathy in the cervical, axillary or inguinal LUNGS: clear to auscultation and percussion with normal breathing  effort HEART: regular rate & rhythm and no murmurs and no lower extremity edema ABDOMEN:abdomen soft, non-tender and normal bowel sounds Musculoskeletal:no cyanosis of digits and no clubbing  NEURO: alert & oriented x 3 with fluent speech, no focal motor/sensory deficits Bilateral breast examination was performed. Well-healed mastectomy scar on the right with associated radiation change on the chest wall. Normal breast exam on the left. LABORATORY DATA:  I have reviewed the data as listed    Component Value Date/Time   NA 139 06/26/2013 1027   NA 135 11/25/2012 0948   K 4.6 06/26/2013 1027   K 4.6 11/25/2012 0948   CL 104 12/24/2012 0912   CL 101 11/25/2012 0948   CO2 25 06/26/2013 1027   CO2 25 11/25/2012 0948   GLUCOSE 85 06/26/2013 1027   GLUCOSE 87 12/24/2012 0912   GLUCOSE 81 11/25/2012 0948   BUN 16.0 06/26/2013 1027   BUN 24* 11/25/2012 0948   CREATININE 0.8 06/26/2013 1027   CREATININE 0.8 11/25/2012 0948   CALCIUM 10.2 06/26/2013 1027   CALCIUM 9.5 11/25/2012 0948   PROT 7.5 06/26/2013 1027   PROT 6.8 06/03/2012 0938   ALBUMIN 3.9 06/26/2013 1027   ALBUMIN 3.7 06/03/2012 0938   AST 17 06/26/2013 1027   AST 16 06/03/2012 0938   ALT <6 06/26/2013 1027   ALT 9 06/03/2012 0938   ALKPHOS 47 06/26/2013 1027   ALKPHOS 35* 06/03/2012 0938   BILITOT 0.31 06/26/2013 1027   BILITOT 0.4 06/03/2012 0938   GFRNONAA 86* 09/03/2011 1050   GFRAA >90 09/03/2011 1050    No results found for this basename: SPEP,  UPEP,   kappa and lambda light chains    Lab Results  Component Value Date   WBC 3.8* 06/26/2013   NEUTROABS 2.4 06/26/2013   HGB 11.0* 06/26/2013   HCT 34.3* 06/26/2013   MCV 93.0 06/26/2013   PLT 212 06/26/2013      Chemistry      Component Value Date/Time   NA 139 06/26/2013 1027   NA 135 11/25/2012 0948   K 4.6 06/26/2013 1027   K 4.6 11/25/2012 0948   CL 104 12/24/2012 0912   CL 101 11/25/2012 0948   CO2 25 06/26/2013 1027   CO2 25 11/25/2012 0948   BUN 16.0  06/26/2013 1027   BUN 24* 11/25/2012 0948   CREATININE 0.8 06/26/2013 1027   CREATININE 0.8 11/25/2012 0948      Component Value Date/Time   CALCIUM 10.2 06/26/2013 1027   CALCIUM 9.5 11/25/2012 0948   ALKPHOS 47 06/26/2013 1027   ALKPHOS 35* 06/03/2012 0938   AST 17 06/26/2013 1027   AST 16 06/03/2012 0938   ALT <6 06/26/2013 1027   ALT 9 06/03/2012 0938   BILITOT 0.31 06/26/2013 1027   BILITOT 0.4 06/03/2012 0938       RADIOGRAPHIC STUDIES: I have personally reviewed the radiological images as listed and agreed with the findings in the report. Recent mammogram this year was normal   ASSESSMENT & PLAN:  #1 right breast cancer ER/PR HER-2/neu negative She will continue Arimidex until May of 2017. She tolerated treatment well. We will continue history, physical examination every 6 months and mammogram once a year. #2 osteopenia She is on Fosamax, calcium and vitamin D. Next bone density  is due in August, 2015. #3 history of smoking The patient has not smoked recently. #4 preventive care She'll continue medication adjustment with her primary care provider. Her most recent colonoscopy was normal. #5 anemia This is likely anemia of chronic disease. Iron study and colonoscopy was negative. #6 leukopenia This is likely due to prior exposure to chemotherapy. She is asymptomatic. We will observe for now.  No orders of the defined types were placed in this encounter.   All questions were answered. The patient knows to call the clinic with any problems, questions or concerns. No barriers to learning was detected. I spent 25 minutes counseling the patient face to face. The total time spent in the appointment was 40 minutes and more than 50% was on counseling and review of test results     [facility], Jospeh Mangel, MD 06/26/2013 2:03 PM

## 2013-06-26 NOTE — Telephone Encounter (Signed)
S/w husband re appt for 12/25/13

## 2013-06-29 ENCOUNTER — Telehealth: Payer: Self-pay | Admitting: *Deleted

## 2013-06-29 NOTE — Telephone Encounter (Signed)
yes

## 2013-06-29 NOTE — Telephone Encounter (Signed)
Labs placed in mail to pt.  Called pt and notified her of labs being mailed.

## 2013-06-29 NOTE — Telephone Encounter (Signed)
Husband asks if we can mail copy of pt's lab work to them.

## 2013-08-20 DIAGNOSIS — Z23 Encounter for immunization: Secondary | ICD-10-CM | POA: Diagnosis not present

## 2013-12-16 ENCOUNTER — Encounter: Payer: Self-pay | Admitting: Pulmonary Disease

## 2013-12-16 ENCOUNTER — Ambulatory Visit (INDEPENDENT_AMBULATORY_CARE_PROVIDER_SITE_OTHER): Payer: Medicare Other | Admitting: Pulmonary Disease

## 2013-12-16 VITALS — BP 110/54 | HR 94 | Wt 110.8 lb

## 2013-12-16 DIAGNOSIS — J449 Chronic obstructive pulmonary disease, unspecified: Secondary | ICD-10-CM

## 2013-12-16 DIAGNOSIS — J4489 Other specified chronic obstructive pulmonary disease: Secondary | ICD-10-CM

## 2013-12-16 NOTE — Assessment & Plan Note (Signed)
Stable lung function She wants to avoid bronchodilators Offered pulm rehab but refused Call if worse

## 2013-12-16 NOTE — Patient Instructions (Signed)
You have moderate COPD Lung capacity is unchanged at 50% If you develop shortness of breath, you would benefit from breathing meds

## 2013-12-16 NOTE — Progress Notes (Signed)
   Subjective:    Patient ID: Rachel Mcbride, female    DOB: 11-01-1945, 68 y.o.   MRN: [id number]  HPI  PCP - Arelia Sneddon   68/F , ex- heavy smoker > 50 Pyrs, for FU of COPD & subcarinal lymphadenopathy.  Dec 2011 Spirometry FEV1 at 55%, ratio at 74.   She presented with a breast mass in 8/11, diagnosed asT2 N1 Mx lobular breast ca with right axillary LN involvement. PET-CT showed negative right cervical Ln, negative 5 mm & 10 mm nodules in the lung & 9x 14 mm subcarinal Ln with malignant range uptake although this showed a hint of calcification on the CT images - EBUS neg.  Underwent MRM, LNs pos -->ER , PR pos, Her-2 neu neg  Pneumovax 04/03/10  Admitted 12/2011for RML PNA. During her stay she developed a-flutter with cardilogy consult ([place]). Echo showed EF 45-50%, mild LV reduction , BNP was elevated at 755.  02/15/11 quit smoking  New RUL nodule 02/2011 -resolved in 08/2012.   12/16/2013  Chief Complaint  Patient presents with  . Follow-up    Breathing has bene fair. Occas wheezing d/t allergy season. C/o prod cough white phlem, nasal cong at times.     Annual FU pt still uses the electronic cig. she has never had to use the proair  , Denies dyspnea , more limited by pain in legs - ' neuropathy ' post chemo FEV1 49%     Review of Systems neg for any significant sore throat, dysphagia, itching, sneezing, nasal congestion or excess/ purulent secretions, fever, chills, sweats, unintended wt loss, pleuritic or exertional cp, hempoptysis, orthopnea pnd or change in chronic leg swelling. Also denies presyncope, palpitations, heartburn, abdominal pain, nausea, vomiting, diarrhea or change in bowel or urinary habits, dysuria,hematuria, rash, arthralgias, visual complaints, headache, numbness weakness or ataxia.     Objective:   Physical Exam  Gen. Pleasant,thin woman, in no distress ENT - no lesions, no post nasal drip Neck: No JVD, no thyromegaly, no carotid bruits Lungs: no use of  accessory muscles, no dullness to percussion, clear without rales or rhonchi  Cardiovascular: Rhythm regular, heart sounds  normal, no murmurs or gallops, no peripheral edema Musculoskeletal: No deformities, no cyanosis or clubbing        Assessment & Plan:

## 2013-12-25 ENCOUNTER — Ambulatory Visit (HOSPITAL_BASED_OUTPATIENT_CLINIC_OR_DEPARTMENT_OTHER): Payer: Medicare Other

## 2013-12-25 ENCOUNTER — Telehealth: Payer: Self-pay | Admitting: Hematology and Oncology

## 2013-12-25 ENCOUNTER — Ambulatory Visit (HOSPITAL_BASED_OUTPATIENT_CLINIC_OR_DEPARTMENT_OTHER): Payer: Medicare Other | Admitting: Hematology and Oncology

## 2013-12-25 VITALS — BP 143/62 | HR 105 | Temp 97.7°F | Resp 18 | Ht 60.0 in | Wt 106.5 lb

## 2013-12-25 DIAGNOSIS — C773 Secondary and unspecified malignant neoplasm of axilla and upper limb lymph nodes: Secondary | ICD-10-CM | POA: Diagnosis not present

## 2013-12-25 DIAGNOSIS — M899 Disorder of bone, unspecified: Secondary | ICD-10-CM | POA: Diagnosis not present

## 2013-12-25 DIAGNOSIS — D72819 Decreased white blood cell count, unspecified: Secondary | ICD-10-CM

## 2013-12-25 DIAGNOSIS — D649 Anemia, unspecified: Secondary | ICD-10-CM

## 2013-12-25 DIAGNOSIS — Z170 Estrogen receptor positive status [ER+]: Secondary | ICD-10-CM

## 2013-12-25 DIAGNOSIS — R634 Abnormal weight loss: Secondary | ICD-10-CM | POA: Diagnosis not present

## 2013-12-25 DIAGNOSIS — C50419 Malignant neoplasm of upper-outer quadrant of unspecified female breast: Secondary | ICD-10-CM

## 2013-12-25 DIAGNOSIS — M949 Disorder of cartilage, unspecified: Secondary | ICD-10-CM

## 2013-12-25 DIAGNOSIS — C50919 Malignant neoplasm of unspecified site of unspecified female breast: Secondary | ICD-10-CM

## 2013-12-25 LAB — CBC & DIFF AND RETIC
BASO%: 0.5 % (ref 0.0–2.0)
Basophils Absolute: 0 10*3/uL (ref 0.0–0.1)
EOS%: 5.4 % (ref 0.0–7.0)
Eosinophils Absolute: 0.2 10*3/uL (ref 0.0–0.5)
HCT: 31.1 % — ABNORMAL LOW (ref 34.8–46.6)
HGB: 9.9 g/dL — ABNORMAL LOW (ref 11.6–15.9)
Immature Retic Fract: 4.4 % (ref 1.60–10.00)
LYMPH#: 1 10*3/uL (ref 0.9–3.3)
LYMPH%: 27 % (ref 14.0–49.7)
MCH: 27.9 pg (ref 25.1–34.0)
MCHC: 31.8 g/dL (ref 31.5–36.0)
MCV: 87.6 fL (ref 79.5–101.0)
MONO#: 0.5 10*3/uL (ref 0.1–0.9)
MONO%: 12.2 % (ref 0.0–14.0)
NEUT#: 2 10*3/uL (ref 1.5–6.5)
NEUT%: 54.9 % (ref 38.4–76.8)
Platelets: 208 10*3/uL (ref 145–400)
RBC: 3.55 10*6/uL — ABNORMAL LOW (ref 3.70–5.45)
RDW: 12.3 % (ref 11.2–14.5)
RETIC CT ABS: 35.86 10*3/uL (ref 33.70–90.70)
Retic %: 1.01 % (ref 0.70–2.10)
WBC: 3.7 10*3/uL — ABNORMAL LOW (ref 3.9–10.3)

## 2013-12-25 LAB — COMPREHENSIVE METABOLIC PANEL (CC13)
ALT: 8 U/L (ref 0–55)
ANION GAP: 10 meq/L (ref 3–11)
AST: 16 U/L (ref 5–34)
Albumin: 3.5 g/dL (ref 3.5–5.0)
Alkaline Phosphatase: 48 U/L (ref 40–150)
BILIRUBIN TOTAL: 0.4 mg/dL (ref 0.20–1.20)
BUN: 16.4 mg/dL (ref 7.0–26.0)
CALCIUM: 10.4 mg/dL (ref 8.4–10.4)
CHLORIDE: 103 meq/L (ref 98–109)
CO2: 24 meq/L (ref 22–29)
CREATININE: 0.8 mg/dL (ref 0.6–1.1)
Glucose: 94 mg/dl (ref 70–140)
Potassium: 4.4 mEq/L (ref 3.5–5.1)
Sodium: 138 mEq/L (ref 136–145)
Total Protein: 6.9 g/dL (ref 6.4–8.3)

## 2013-12-25 LAB — TSH CHCC: TSH: 0.001 m[IU]/L — AB (ref 0.308–3.960)

## 2013-12-25 NOTE — Telephone Encounter (Signed)
pt sent back to lab and given appt schedule for nov. per 5/15 pof labs to be done today.

## 2013-12-25 NOTE — Progress Notes (Signed)
[name] OFFICE PROGRESS NOTE  Patient Care Team: Leonard Downing, MD as PCP - General (Family Medicine) Rolm Bookbinder, MD (General Surgery) Blair Promise, MD (Radiation Oncology) Rigoberto Noel, MD (Pulmonary Disease) Heath Lark, MD as Consulting Physician (Hematology and Oncology)  DIAGNOSIS: Right breast cancer, ongoing treatment with Arimidex  SUMMARY OF ONCOLOGIC HISTORY: 1. A pT2pN2aMX (stage IIIA) invasive ductal carcinoma measuring 2.4 cm, grade 2 out of 3, status post right mastectomy with axillary lymph node dissection on April 20, 2010, with negative surgical margins; 6 out of 17 lymph nodes positive, with extracapsular extension. There was lymphovascular invasion. ER 81%, PR 64%, Ki-67 of 16%, HER-2/neu by CISH was negative ratio of 1.46.  2. Subcarinal adenopathy positive on PET scan in 03/2010; however, EBUS FNA was negative. Follow up CT chest in 07/2010 showed stable calcified subcarinal node. She has been followed by Dr. Elsworth Mcbride.   PAST THERAPY: s/p adjuvant chemotherapy with dose dense AC followed by weekly Taxol finished on 09/21/2010. She terminated Taxol prematurely due to neuropathy, fatigue.   CURRENT THERAPY: started Arimidex in May 2012.  INTERVAL HISTORY: Rachel Mcbride 68 y.o. female returns for further followup. She has lost some weight. She felt that this is related to mild loss of appetite. She still has complaints of peripheral neuropathy. She denies any recent abnormal breast examination, palpable mass, abnormal breast appearance or nipple changes She also complains of chronic musculoskeletal pain.  I have reviewed the past medical history, past surgical history, social history and family history with the patient and they are unchanged from previous note.  ALLERGIES:  is allergic to pneumococcal vaccines and shellfish-derived products.  MEDICATIONS:  Current Outpatient Prescriptions  Medication Sig Dispense Refill  .  alendronate (FOSAMAX) 35 MG tablet Take 1 tablet (35 mg total) by mouth every 7 (seven) days.  12 tablet  3  . anastrozole (ARIMIDEX) 1 MG tablet Take 1 tablet (1 mg total) by mouth daily. AM  90 tablet  3  . aspirin 81 MG tablet Take 81 mg by mouth daily.      . Calcium Carbonate (CALCIUM 600 PO) Take by mouth 2 (two) times daily.       . Cholecalciferol (VITAMIN D PO) Take 1,000 Units by mouth daily.       Marland Kitchen lisinopril (PRINIVIL,ZESTRIL) 10 MG tablet Take 1 tablet (10 mg total) by mouth daily.  90 tablet  2   No current facility-administered medications for this visit.    REVIEW OF SYSTEMS:   Constitutional: Denies fevers, chills  Eyes: Denies blurriness of vision Ears, nose, mouth, throat, and face: Denies mucositis or sore throat Respiratory: Denies cough, dyspnea or wheezes Cardiovascular: Denies palpitation, chest discomfort or lower extremity swelling Gastrointestinal:  Denies nausea, heartburn or change in bowel habits Skin: Denies abnormal skin rashes Lymphatics: Denies new lymphadenopathy or easy bruising Behavioral/Psych: Mood is stable, no new changes  All other systems were reviewed with the patient and are negative.  PHYSICAL EXAMINATION: ECOG PERFORMANCE STATUS: 1 - Symptomatic but completely ambulatory  Filed Vitals:   12/25/13 1254  BP: 143/62  Pulse: 105  Temp: 97.7 F (36.5 C)  Resp: 18   Filed Weights   12/25/13 1254  Weight: 106 lb 8 oz (48.308 kg)    GENERAL:alert, no distress and comfortable. She looks thin and mildly cachectic SKIN: skin color, texture, turgor are normal, no rashes or significant lesions EYES: normal, Conjunctiva are pink and non-injected, sclera clear OROPHARYNX:no exudate, no erythema  and lips, buccal mucosa, and tongue normal  NECK: supple, thyroid normal size, non-tender, without nodularity LYMPH:  no palpable lymphadenopathy in the cervical, axillary or inguinal LUNGS: clear to auscultation and percussion with normal breathing  effort HEART: regular rate & rhythm and no murmurs and no lower extremity edema ABDOMEN:abdomen soft, non-tender and normal bowel sounds Musculoskeletal:no cyanosis of digits and no clubbing  NEURO: alert & oriented x 3 with fluent speech, no focal motor/sensory deficits Left breast examination was normal. On the right breast, well-healed mastectomy scar with no palpable abnormalities. LABORATORY DATA:  I have reviewed the data as listed    Component Value Date/Time   NA 138 12/25/2013 1420   NA 135 11/25/2012 0948   K 4.4 12/25/2013 1420   K 4.6 11/25/2012 0948   CL 104 12/24/2012 0912   CL 101 11/25/2012 0948   CO2 24 12/25/2013 1420   CO2 25 11/25/2012 0948   GLUCOSE 94 12/25/2013 1420   GLUCOSE 87 12/24/2012 0912   GLUCOSE 81 11/25/2012 0948   BUN 16.4 12/25/2013 1420   BUN 24* 11/25/2012 0948   CREATININE 0.8 12/25/2013 1420   CREATININE 0.8 11/25/2012 0948   CALCIUM 10.4 12/25/2013 1420   CALCIUM 9.5 11/25/2012 0948   PROT 6.9 12/25/2013 1420   PROT 6.8 06/03/2012 0938   ALBUMIN 3.5 12/25/2013 1420   ALBUMIN 3.7 06/03/2012 0938   AST 16 12/25/2013 1420   AST 16 06/03/2012 0938   ALT 8 12/25/2013 1420   ALT 9 06/03/2012 0938   ALKPHOS 48 12/25/2013 1420   ALKPHOS 35* 06/03/2012 0938   BILITOT 0.40 12/25/2013 1420   BILITOT 0.4 06/03/2012 0938   GFRNONAA 86* 09/03/2011 1050   GFRAA >90 09/03/2011 1050    No results found for this basename: SPEP,  UPEP,   kappa and lambda light chains    Lab Results  Component Value Date   WBC 3.7* 12/25/2013   NEUTROABS 2.0 12/25/2013   HGB 9.9* 12/25/2013   HCT 31.1* 12/25/2013   MCV 87.6 12/25/2013   PLT 208 12/25/2013      Chemistry      Component Value Date/Time   NA 138 12/25/2013 1420   NA 135 11/25/2012 0948   K 4.4 12/25/2013 1420   K 4.6 11/25/2012 0948   CL 104 12/24/2012 0912   CL 101 11/25/2012 0948   CO2 24 12/25/2013 1420   CO2 25 11/25/2012 0948   BUN 16.4 12/25/2013 1420   BUN 24* 11/25/2012 0948   CREATININE 0.8 12/25/2013 1420    CREATININE 0.8 11/25/2012 0948      Component Value Date/Time   CALCIUM 10.4 12/25/2013 1420   CALCIUM 9.5 11/25/2012 0948   ALKPHOS 48 12/25/2013 1420   ALKPHOS 35* 06/03/2012 0938   AST 16 12/25/2013 1420   AST 16 06/03/2012 0938   ALT 8 12/25/2013 1420   ALT 9 06/03/2012 0938   BILITOT 0.40 12/25/2013 1420   BILITOT 0.4 06/03/2012 0938     ASSESSMENT & PLAN:  #1 right breast cancer ER/PR positive, HER-2/neu negative She will continue Arimidex until May of 2017. She tolerated treatment well.  I am very concerned about weight loss. I will addition a workup for this #2 osteopenia She is on Fosamax, calcium and vitamin D. Next bone density is due in August, 2015. She complain of musculoskeletal pain. She is not taking calcium vitamin D is consistently. I recommended she increase her vitamin D and calcium supplements. #3 unexplained weight loss I  order tumor markers and thyroid function tests for evaluation. #4 anemia This is likely anemia of chronic disease. However this is worse. I will order additional workup as above  Her recent colonoscopy was negative. #5 leukopenia This is likely due to prior exposure to chemotherapy. She is asymptomatic. We will observe for now.  I will call the patient was test results. If her tumor markers are elevated, she may need CT imaging study to rule out recurrent/metastatic disease. Orders Placed This Encounter  Procedures  . Cancer antigen 27.29    Standing Status: Future     Number of Occurrences: 1     Standing Expiration Date: 12/25/2014  . CBC & Diff and Retic    Standing Status: Future     Number of Occurrences: 1     Standing Expiration Date: 12/25/2014  . Comprehensive metabolic panel    Standing Status: Future     Number of Occurrences: 1     Standing Expiration Date: 12/25/2014  . Sedimentation rate    Standing Status: Future     Number of Occurrences: 1     Standing Expiration Date: 12/25/2014  . TSH    Standing Status: Future      Number of Occurrences: 1     Standing Expiration Date: 12/25/2014  . T4, free    Standing Status: Future     Number of Occurrences: 1     Standing Expiration Date: 12/25/2014   All questions were answered. The patient knows to call the clinic with any problems, questions or concerns. No barriers to learning was detected.    Heath Lark, MD 12/25/2013 8:23 PM

## 2013-12-26 LAB — CANCER ANTIGEN 27.29: CA 27.29: 9 U/mL (ref 0–39)

## 2013-12-26 LAB — T4, FREE: FREE T4: 3.13 ng/dL — AB (ref 0.80–1.80)

## 2013-12-26 LAB — SEDIMENTATION RATE: Sed Rate: 34 mm/hr — ABNORMAL HIGH (ref 0–22)

## 2013-12-28 ENCOUNTER — Telehealth: Payer: Self-pay | Admitting: Hematology and Oncology

## 2013-12-28 ENCOUNTER — Other Ambulatory Visit: Payer: Self-pay | Admitting: Hematology and Oncology

## 2013-12-28 ENCOUNTER — Telehealth: Payer: Self-pay | Admitting: *Deleted

## 2013-12-28 DIAGNOSIS — C50919 Malignant neoplasm of unspecified site of unspecified female breast: Secondary | ICD-10-CM

## 2013-12-28 DIAGNOSIS — R634 Abnormal weight loss: Secondary | ICD-10-CM

## 2013-12-28 NOTE — Telephone Encounter (Signed)
MEDICAL RECORDS FAXED TO DR. Sabula @ [phone].

## 2013-12-28 NOTE — Telephone Encounter (Signed)
s.w. pt and advised on Aug....lvm for Endo [phone]...sent Kim Heggerty adn Dawson Bills to msg to fax over records to [phone]. pt aware endo office will call her with appt

## 2013-12-28 NOTE — Telephone Encounter (Signed)
Informed pt of referral to Endocrinologist for abnormal thyroid test.  Expect call from their office for appt.. She verbalized understanding.

## 2013-12-28 NOTE — Telephone Encounter (Signed)
Message copied by Cathlean Cower on Mon Dec 28, 2013 11:01 AM ------      Message from: [facility], [place]      Created: Mon Dec 28, 2013 10:38 AM      Regarding: abnormal thyroid       Please let her know her thyroid function test is very abnormal, could explain her abnormal weight loss and abnormal blood work      I placed endocrinology consult            ----- Message -----         From: [facility]         Sent: 12/25/2013   2:30 PM           To: Heath Lark, MD                   ------

## 2013-12-29 ENCOUNTER — Other Ambulatory Visit: Payer: Self-pay | Admitting: Cardiovascular Disease

## 2013-12-29 ENCOUNTER — Telehealth: Payer: Self-pay | Admitting: Dietician

## 2013-12-29 ENCOUNTER — Telehealth: Payer: Self-pay | Admitting: *Deleted

## 2013-12-29 ENCOUNTER — Telehealth: Payer: Self-pay | Admitting: Hematology and Oncology

## 2013-12-29 NOTE — Telephone Encounter (Signed)
Brief Outpatient Oncology Nutrition Note  Patient has been identified to be at risk on malnutrition screen.  Wt Readings from Last 10 Encounters:  12/25/13 106 lb 8 oz (48.308 kg)  12/16/13 110 lb 12.8 oz (50.259 kg)  06/26/13 115 lb 8 oz (52.39 kg)  05/26/13 114 lb (51.71 kg)  02/17/13 113 lb (51.256 kg)  01/12/13 113 lb 2 oz (51.313 kg)  12/24/12 111 lb 8 oz (50.576 kg)  11/25/12 112 lb (50.803 kg)  09/15/12 113 lb 12.8 oz (51.619 kg)  07/02/12 111 lb 1.6 oz (50.395 kg)      Dx:  Right Breast Cancer, ongoing treatment with Arimidex.  Patient of Dr. Alvy Bimler.  Called patient due to weight loss.  Thyroid labs are abnormal and patient has an appointment to see an endocrinologist.  Spoke with husband who states that patient's eating habits have not changed a lot lately.  Continues to be picky eater but has poor energy.  Eats more when she eats out.  Patient has stopped using protein shakes and has started drinking V-8.   Discussed need to increase calories in diet to work at weight loss prevention.  Will send tips to increase calories and protein along with the [place] RD's number and coupons for supplements.  Recommend increasing to 2 protein/calorie supplements daily.  Antonieta Iba, RD, LDN

## 2013-12-29 NOTE — Telephone Encounter (Signed)
Husband called to clarify appt w/ dr. Alvy Bimler in August and ask about referral to Endocrinologist.  Explained that referral was made for abnormal thyroid level and gave him the number for Dr. Chalmers Cater to f/u for appt.Marland Kitchen  He verbalized understanding.

## 2013-12-29 NOTE — Telephone Encounter (Signed)
Faxed pt medical records to Dr. Chalmers Cater

## 2013-12-31 ENCOUNTER — Telehealth: Payer: Self-pay | Admitting: *Deleted

## 2013-12-31 NOTE — Telephone Encounter (Signed)
Sooner within 2-3 weeks if possible

## 2013-12-31 NOTE — Telephone Encounter (Signed)
Pt has new pt appt w/ Endocrinolgist on 02/24/14 at 1:30 pm w/ Dr. Chalmers Cater.

## 2013-12-31 NOTE — Telephone Encounter (Signed)
S/w [facility] at Dr. Almetta Lovely office to request sooner appt for pt.  She request note be faxed to them informing MD of need for sooner visit.  Faxed letter to them at [phone].

## 2013-12-31 NOTE — Telephone Encounter (Signed)
Call from [facility] at Dr. Chalmers Cater.  They will see pt tomorrow at 10:30 am.   Informed pt of appt tomorrow,  Arrive at 10;15 am,  [employer] and medications.  Gave her the address and phone number.  She verbalized understanding.

## 2014-01-01 DIAGNOSIS — E0590 Thyrotoxicosis, unspecified without thyrotoxic crisis or storm: Secondary | ICD-10-CM | POA: Diagnosis not present

## 2014-01-07 ENCOUNTER — Telehealth: Payer: Self-pay | Admitting: *Deleted

## 2014-01-07 NOTE — Telephone Encounter (Signed)
Husband called to find out when thyroid scan ordered by Dr. Chalmers Cater is going to be done?  Informed husband and also s/w pt that they will need to contact Dr. Almetta Lovely office to find out if they have sent the order yet.  Also suggested they can call Radiology scheduler to find out if they have the order yet. Husband and pt verbalized understanding.

## 2014-01-08 ENCOUNTER — Other Ambulatory Visit (HOSPITAL_COMMUNITY): Payer: Self-pay | Admitting: Endocrinology

## 2014-01-08 DIAGNOSIS — E0590 Thyrotoxicosis, unspecified without thyrotoxic crisis or storm: Secondary | ICD-10-CM

## 2014-01-21 ENCOUNTER — Encounter (HOSPITAL_COMMUNITY)
Admission: RE | Admit: 2014-01-21 | Discharge: 2014-01-21 | Disposition: A | Discharge status: Home / Self Care | Payer: Medicare Other | Source: Ambulatory Visit | Attending: Endocrinology | Admitting: Endocrinology

## 2014-01-21 DIAGNOSIS — E0590 Thyrotoxicosis, unspecified without thyrotoxic crisis or storm: Secondary | ICD-10-CM | POA: Diagnosis not present

## 2014-01-22 ENCOUNTER — Encounter (HOSPITAL_COMMUNITY)
Admission: RE | Admit: 2014-01-22 | Discharge: 2014-01-22 | Disposition: A | Discharge status: Home / Self Care | Payer: Medicare Other | Source: Ambulatory Visit | Attending: Endocrinology | Admitting: Endocrinology

## 2014-01-22 DIAGNOSIS — E0500 Thyrotoxicosis with diffuse goiter without thyrotoxic crisis or storm: Secondary | ICD-10-CM | POA: Diagnosis not present

## 2014-01-22 MED ORDER — SODIUM PERTECHNETATE TC 99M INJECTION
10.0000 | Freq: Once | INTRAVENOUS | Status: AC | PRN
Start: 2014-01-22 — End: 2014-01-22
  Administered 2014-01-22: 10 via INTRAVENOUS

## 2014-01-27 ENCOUNTER — Other Ambulatory Visit: Payer: Self-pay | Admitting: Endocrinology

## 2014-01-27 DIAGNOSIS — E0590 Thyrotoxicosis, unspecified without thyrotoxic crisis or storm: Secondary | ICD-10-CM

## 2014-02-01 ENCOUNTER — Encounter (HOSPITAL_COMMUNITY)
Admission: RE | Admit: 2014-02-01 | Discharge: 2014-02-01 | Disposition: A | Discharge status: Home / Self Care | Payer: Medicare Other | Source: Ambulatory Visit | Attending: Endocrinology | Admitting: Endocrinology

## 2014-02-01 DIAGNOSIS — E0590 Thyrotoxicosis, unspecified without thyrotoxic crisis or storm: Secondary | ICD-10-CM | POA: Diagnosis not present

## 2014-02-01 MED ORDER — SODIUM IODIDE I 131 CAPSULE
13.1000 | Freq: Once | INTRAVENOUS | Status: AC | PRN
  Administered 2014-02-01: 13.1 via ORAL

## 2014-02-04 DIAGNOSIS — E0590 Thyrotoxicosis, unspecified without thyrotoxic crisis or storm: Secondary | ICD-10-CM | POA: Diagnosis not present

## 2014-02-05 ENCOUNTER — Telehealth: Payer: Self-pay | Admitting: Cardiovascular Disease

## 2014-02-05 NOTE — Telephone Encounter (Signed)
Pt's husband would like to know regarding the diagnosis of Hyperthyroidism. Husband is aware that a side effect of this diagnosis is palpitations. Husband states that pt has not had any palpitations. He is aware that if she start having the palpitations specially if she has symptoms he/she need to call for an appointment. Pt's husband  Verbalized understanding.

## 2014-02-05 NOTE — Telephone Encounter (Signed)
New Prob    Pt was just diagnosed with Hyperthyroidism. Husband would like to speak to a nurse regarding this new diagnosis and how this could impact pts heart.

## 2014-02-05 NOTE — Telephone Encounter (Signed)
Left pt a message to call back. 

## 2014-02-26 ENCOUNTER — Other Ambulatory Visit: Payer: Self-pay | Admitting: Cardiovascular Disease

## 2014-03-10 ENCOUNTER — Other Ambulatory Visit: Payer: Self-pay | Admitting: Hematology and Oncology

## 2014-03-16 ENCOUNTER — Other Ambulatory Visit: Payer: Self-pay | Admitting: Hematology and Oncology

## 2014-03-16 DIAGNOSIS — Z1231 Encounter for screening mammogram for malignant neoplasm of breast: Secondary | ICD-10-CM

## 2014-03-16 DIAGNOSIS — Z9011 Acquired absence of right breast and nipple: Secondary | ICD-10-CM

## 2014-03-16 DIAGNOSIS — Z853 Personal history of malignant neoplasm of breast: Secondary | ICD-10-CM

## 2014-03-22 ENCOUNTER — Other Ambulatory Visit (HOSPITAL_BASED_OUTPATIENT_CLINIC_OR_DEPARTMENT_OTHER): Payer: Medicare Other

## 2014-03-22 ENCOUNTER — Ambulatory Visit (HOSPITAL_BASED_OUTPATIENT_CLINIC_OR_DEPARTMENT_OTHER): Payer: Medicare Other | Admitting: Hematology and Oncology

## 2014-03-22 ENCOUNTER — Encounter: Payer: Self-pay | Admitting: Hematology and Oncology

## 2014-03-22 VITALS — BP 137/64 | HR 67 | Temp 98.5°F | Resp 18 | Ht 60.0 in | Wt 105.8 lb

## 2014-03-22 DIAGNOSIS — R634 Abnormal weight loss: Secondary | ICD-10-CM

## 2014-03-22 DIAGNOSIS — Z853 Personal history of malignant neoplasm of breast: Secondary | ICD-10-CM

## 2014-03-22 DIAGNOSIS — C50911 Malignant neoplasm of unspecified site of right female breast: Secondary | ICD-10-CM

## 2014-03-22 DIAGNOSIS — C50919 Malignant neoplasm of unspecified site of unspecified female breast: Secondary | ICD-10-CM | POA: Diagnosis not present

## 2014-03-22 DIAGNOSIS — D649 Anemia, unspecified: Secondary | ICD-10-CM

## 2014-03-22 DIAGNOSIS — C773 Secondary and unspecified malignant neoplasm of axilla and upper limb lymph nodes: Secondary | ICD-10-CM | POA: Diagnosis not present

## 2014-03-22 DIAGNOSIS — E0500 Thyrotoxicosis with diffuse goiter without thyrotoxic crisis or storm: Secondary | ICD-10-CM

## 2014-03-22 DIAGNOSIS — D638 Anemia in other chronic diseases classified elsewhere: Secondary | ICD-10-CM

## 2014-03-22 LAB — COMPREHENSIVE METABOLIC PANEL
ALBUMIN: 4 g/dL (ref 3.5–5.2)
ALT: <8 U/L (ref 0–35)
AST: 18 U/L (ref 0–37)
Alkaline Phosphatase: 48 U/L (ref 39–117)
BUN: 18 mg/dL (ref 6–23)
CALCIUM: 9.7 mg/dL (ref 8.4–10.5)
CHLORIDE: 99 meq/L (ref 96–112)
CO2: 31 mEq/L (ref 19–32)
Creatinine, Ser: 0.89 mg/dL (ref 0.50–1.10)
Glucose, Bld: 69 mg/dL — ABNORMAL LOW (ref 70–99)
POTASSIUM: 4.8 meq/L (ref 3.5–5.3)
SODIUM: 136 meq/L (ref 135–145)
Total Bilirubin: 0.4 mg/dL (ref 0.2–1.2)
Total Protein: 6.7 g/dL (ref 6.0–8.3)

## 2014-03-22 LAB — CBC WITH DIFFERENTIAL/PLATELET
BASO%: 1.2 % (ref 0.0–2.0)
BASOS ABS: 0.1 10*3/uL (ref 0.0–0.1)
EOS ABS: 0.4 10*3/uL (ref 0.0–0.5)
EOS%: 6.7 % (ref 0.0–7.0)
HCT: 34.2 % — ABNORMAL LOW (ref 34.8–46.6)
HGB: 10.8 g/dL — ABNORMAL LOW (ref 11.6–15.9)
LYMPH#: 0.9 10*3/uL (ref 0.9–3.3)
LYMPH%: 16.5 % (ref 14.0–49.7)
MCH: 27.5 pg (ref 25.1–34.0)
MCHC: 31.5 g/dL (ref 31.5–36.0)
MCV: 87.2 fL (ref 79.5–101.0)
MONO#: 0.5 10*3/uL (ref 0.1–0.9)
MONO%: 9.6 % (ref 0.0–14.0)
NEUT%: 66 % (ref 38.4–76.8)
NEUTROS ABS: 3.5 10*3/uL (ref 1.5–6.5)
Platelets: 258 10*3/uL (ref 145–400)
RBC: 3.92 10*6/uL (ref 3.70–5.45)
RDW: 15.4 % — AB (ref 11.2–14.5)
WBC: 5.3 10*3/uL (ref 3.9–10.3)

## 2014-03-22 NOTE — Progress Notes (Signed)
[facility] OFFICE PROGRESS NOTE  Patient Care Team: Leonard Downing, MD as PCP - General (Family Medicine) Rolm Bookbinder, MD (General Surgery) Blair Promise, MD (Radiation Oncology) Rigoberto Noel, MD (Pulmonary Disease) Heath Lark, MD as Consulting Physician (Hematology and Oncology)  SUMMARY OF ONCOLOGIC HISTORY: This patient was diagnosed with pT2pN2aMX (stage IIIA) invasive ductal carcinoma measuring 2.4 cm, grade 2 out of 3, status post right mastectomy with axillary lymph node dissection on April 20, 2010, with negative surgical margins; 6 out of 17 lymph nodes positive, with extracapsular extension. There was lymphovascular invasion. ER 81%, PR 64%, Ki-67 of 16%, HER-2/neu by CISH was negative ratio of 1.46. She had subcarinal adenopathy positive on PET scan in 03/2010; however, EBUS FNA was negative. Follow up CT chest in 07/2010 showed stable calcified subcarinal node. She has been followed by Dr. Elsworth Soho. She received adjuvant chemotherapy with dose dense AC followed by weekly Taxol finished on 09/21/2010. She terminated Taxol prematurely due to neuropathy, fatigue.  She had been on Arimidex since May of 2012. In May of 2015, she has unexplained weight loss and was subsequently found to have Graves' disease. She received radioactive iodine treatment. INTERVAL HISTORY: Please see below for problem oriented charting. She is feeling well. She has not lost any further weight. She is wondering about cataract surgery. She denies any recent abnormal breast examination, palpable mass, abnormal breast appearance or nipple changes  REVIEW OF SYSTEMS:   Constitutional: Denies fevers, chills or abnormal weight loss Eyes: Denies blurriness of vision Ears, nose, mouth, throat, and face: Denies mucositis or sore throat Respiratory: Denies cough, dyspnea or wheezes Cardiovascular: Denies palpitation, chest discomfort or lower extremity swelling Gastrointestinal:  Denies  nausea, heartburn or change in bowel habits Skin: Denies abnormal skin rashes Lymphatics: Denies new lymphadenopathy or easy bruising Neurological:Denies numbness, tingling or new weaknesses Behavioral/Psych: Mood is stable, no new changes  All other systems were reviewed with the patient and are negative.  I have reviewed the past medical history, past surgical history, social history and family history with the patient and they are unchanged from previous note.  ALLERGIES:  is allergic to pneumococcal vaccines and shellfish-derived products.  MEDICATIONS:  Current Outpatient Prescriptions  Medication Sig Dispense Refill  . alendronate (FOSAMAX) 35 MG tablet Take 1 tablet (35 mg total) by mouth every 7 (seven) days.  12 tablet  3  . anastrozole (ARIMIDEX) 1 MG tablet TAKE 1 TABLET EVERY DAY  90 tablet  3  . aspirin 81 MG tablet Take 81 mg by mouth daily.      . Calcium Carbonate (CALCIUM 600 PO) Take by mouth 2 (two) times daily.       . Cholecalciferol (VITAMIN D PO) Take 1,000 Units by mouth daily.       Marland Kitchen lisinopril (PRINIVIL,ZESTRIL) 10 MG tablet TAKE 1 TABLET EVERY DAY  90 tablet  1   No current facility-administered medications for this visit.    PHYSICAL EXAMINATION: ECOG PERFORMANCE STATUS: 0 - Asymptomatic  Filed Vitals:   03/22/14 1244  BP: 137/64  Pulse: 67  Temp: 98.5 F (36.9 C)  Resp: 18   Filed Weights   03/22/14 1244  Weight: 105 lb 12.8 oz (47.991 kg)    GENERAL:alert, no distress and comfortable SKIN: skin color, texture, turgor are normal, no rashes or significant lesions EYES: normal, Conjunctiva are pink and non-injected, sclera clear OROPHARYNX:no exudate, no erythema and lips, buccal mucosa, and tongue normal  NECK: supple, thyroid normal  size, non-tender, without nodularity LYMPH:  no palpable lymphadenopathy in the cervical, axillary or inguinal LUNGS: clear to auscultation and percussion with normal breathing effort HEART: regular rate &  rhythm and no murmurs and no lower extremity edema ABDOMEN:abdomen soft, non-tender and normal bowel sounds Musculoskeletal:no cyanosis of digits and no clubbing  NEURO: alert & oriented x 3 with fluent speech, no focal motor/sensory deficits Well-healed mastectomy scar on the right. Normal breast exam on the left. LABORATORY DATA:  I have reviewed the data as listed    Component Value Date/Time   NA 138 12/25/2013 1420   NA 135 11/25/2012 0948   K 4.4 12/25/2013 1420   K 4.6 11/25/2012 0948   CL 104 12/24/2012 0912   CL 101 11/25/2012 0948   CO2 24 12/25/2013 1420   CO2 25 11/25/2012 0948   GLUCOSE 94 12/25/2013 1420   GLUCOSE 87 12/24/2012 0912   GLUCOSE 81 11/25/2012 0948   BUN 16.4 12/25/2013 1420   BUN 24* 11/25/2012 0948   CREATININE 0.8 12/25/2013 1420   CREATININE 0.8 11/25/2012 0948   CALCIUM 10.4 12/25/2013 1420   CALCIUM 9.5 11/25/2012 0948   PROT 6.9 12/25/2013 1420   PROT 6.8 06/03/2012 0938   ALBUMIN 3.5 12/25/2013 1420   ALBUMIN 3.7 06/03/2012 0938   AST 16 12/25/2013 1420   AST 16 06/03/2012 0938   ALT 8 12/25/2013 1420   ALT 9 06/03/2012 0938   ALKPHOS 48 12/25/2013 1420   ALKPHOS 35* 06/03/2012 0938   BILITOT 0.40 12/25/2013 1420   BILITOT 0.4 06/03/2012 0938   GFRNONAA 86* 09/03/2011 1050   GFRAA >90 09/03/2011 1050    No results found for this basename: SPEP,  UPEP,   kappa and lambda light chains    Lab Results  Component Value Date   WBC 5.3 03/22/2014   NEUTROABS 3.5 03/22/2014   HGB 10.8* 03/22/2014   HCT 34.2* 03/22/2014   MCV 87.2 03/22/2014   PLT 258 03/22/2014      Chemistry      Component Value Date/Time   NA 138 12/25/2013 1420   NA 135 11/25/2012 0948   K 4.4 12/25/2013 1420   K 4.6 11/25/2012 0948   CL 104 12/24/2012 0912   CL 101 11/25/2012 0948   CO2 24 12/25/2013 1420   CO2 25 11/25/2012 0948   BUN 16.4 12/25/2013 1420   BUN 24* 11/25/2012 0948   CREATININE 0.8 12/25/2013 1420   CREATININE 0.8 11/25/2012 0948      Component Value Date/Time   CALCIUM  10.4 12/25/2013 1420   CALCIUM 9.5 11/25/2012 0948   ALKPHOS 48 12/25/2013 1420   ALKPHOS 35* 06/03/2012 0938   AST 16 12/25/2013 1420   AST 16 06/03/2012 0938   ALT 8 12/25/2013 1420   ALT 9 06/03/2012 0938   BILITOT 0.40 12/25/2013 1420   BILITOT 0.4 06/03/2012 0938     ASSESSMENT & PLAN:  Breast cancer Clinically, she has no evidence of recurrence. She will continue her Arimidex treatment until May of 2017. I will see her back next year with history, physical examination and blood work. She will continue yearly screening mammogram on the contralateral breast.  Anemia in chronic illness This is likely anemia of chronic disease. The patient denies recent history of bleeding such as epistaxis, hematuria or hematochezia. She is asymptomatic from the anemia. We will observe for now.  She does not require transfusion now.    Graves disease This is the cause of her weight  loss. I would defer to her endocrinologist for management.   I recommend she deferred cataract surgery until clearance from her endocrinologist.  Orders Placed This Encounter  Procedures  . CBC with Differential    Standing Status: Future     Number of Occurrences:      Standing Expiration Date: 04/26/2015  . Comprehensive metabolic panel    Standing Status: Future     Number of Occurrences:      Standing Expiration Date: 04/26/2015  . Cancer antigen 27.29    Standing Status: Future     Number of Occurrences:      Standing Expiration Date: 04/26/2015   All questions were answered. The patient knows to call the clinic with any problems, questions or concerns. No barriers to learning was detected. I spent 15 minutes counseling the patient face to face. The total time spent in the appointment was 20 minutes and more than 50% was on counseling and review of test results     [facility], [place], MD 03/22/2014 1:37 PM

## 2014-03-22 NOTE — Assessment & Plan Note (Signed)
Clinically, she has no evidence of recurrence. She will continue her Arimidex treatment until May of 2017. I will see her back next year with history, physical examination and blood work. She will continue yearly screening mammogram on the contralateral breast.

## 2014-03-22 NOTE — Assessment & Plan Note (Signed)
This is likely anemia of chronic disease. The patient denies recent history of bleeding such as epistaxis, hematuria or hematochezia. She is asymptomatic from the anemia. We will observe for now.  She does not require transfusion now.   

## 2014-03-22 NOTE — Assessment & Plan Note (Signed)
This is the cause of her weight loss. I would defer to her endocrinologist for management.

## 2014-03-23 LAB — CANCER ANTIGEN 27.29: CA 27.29: 13 U/mL (ref 0–39)

## 2014-03-24 ENCOUNTER — Telehealth: Payer: Self-pay | Admitting: Hematology and Oncology

## 2014-03-24 NOTE — Telephone Encounter (Signed)
S/ pt's husband, gave apt 11/22/14 @ 10.30am.

## 2014-04-03 ENCOUNTER — Other Ambulatory Visit: Payer: Self-pay | Admitting: Hematology and Oncology

## 2014-04-06 DIAGNOSIS — E0590 Thyrotoxicosis, unspecified without thyrotoxic crisis or storm: Secondary | ICD-10-CM | POA: Diagnosis not present

## 2014-04-15 ENCOUNTER — Ambulatory Visit
Admission: RE | Admit: 2014-04-15 | Discharge: 2014-04-15 | Disposition: A | Discharge status: Home / Self Care | Payer: Medicare Other | Source: Ambulatory Visit | Attending: Hematology and Oncology | Admitting: Hematology and Oncology

## 2014-04-15 DIAGNOSIS — Z1231 Encounter for screening mammogram for malignant neoplasm of breast: Secondary | ICD-10-CM

## 2014-04-15 DIAGNOSIS — Z853 Personal history of malignant neoplasm of breast: Secondary | ICD-10-CM

## 2014-04-15 DIAGNOSIS — Z9011 Acquired absence of right breast and nipple: Secondary | ICD-10-CM

## 2014-05-05 ENCOUNTER — Telehealth: Payer: Self-pay | Admitting: *Deleted

## 2014-05-05 ENCOUNTER — Telehealth: Payer: Self-pay | Admitting: Pulmonary Disease

## 2014-05-05 ENCOUNTER — Telehealth: Payer: Self-pay | Admitting: Cardiovascular Disease

## 2014-05-05 NOTE — Telephone Encounter (Signed)
Spoke with patient's husband who states they tried to get a flu shot today at a clinic, but did not know which one to get: apparently there are 3 different types this year.They could not recommend which vaccine she sould receive. States 2 other doctors of hers told her not to get the high powered > 65 yrs of age immunization. Advised him to check with her PCP. Voices understanding.

## 2014-05-05 NOTE — Telephone Encounter (Signed)
°  Pt's husband called there are three different types of flu shot this year. He wants to know what Dr Acie Fredrickson recommends. Please call and advise.

## 2014-05-05 NOTE — Telephone Encounter (Signed)
Husband asks if pt can get flu shot.  Informed him Dr. Alvy Bimler is recommending flu shot for her patients and that we are giving the "regular" flu vaccine here at our clinic.  We can schedule pt for flu shot here if desired.  He verbalized understanding and states pt will probably get at her PCP but he will call us back if she wants it here.

## 2014-05-05 NOTE — Telephone Encounter (Signed)
Whichever her primary medical doctor recommends I do not know which is better.

## 2014-05-05 NOTE — Telephone Encounter (Signed)
Called spoke w/ pt spouse. He reports there are 3 diff kinds of flu shot out. He wants to know which we are given our patients. I advised him we which we are given. He needed nothing further

## 2014-05-07 DIAGNOSIS — Z23 Encounter for immunization: Secondary | ICD-10-CM | POA: Diagnosis not present

## 2014-06-09 ENCOUNTER — Ambulatory Visit (INDEPENDENT_AMBULATORY_CARE_PROVIDER_SITE_OTHER): Payer: Medicare Other | Admitting: Cardiovascular Disease

## 2014-06-09 ENCOUNTER — Encounter: Payer: Self-pay | Admitting: Cardiovascular Disease

## 2014-06-09 VITALS — BP 108/50 | HR 60 | Ht 63.5 in | Wt 108.6 lb

## 2014-06-09 DIAGNOSIS — I5032 Chronic diastolic (congestive) heart failure: Secondary | ICD-10-CM | POA: Diagnosis not present

## 2014-06-09 DIAGNOSIS — I480 Paroxysmal atrial fibrillation: Secondary | ICD-10-CM | POA: Diagnosis not present

## 2014-06-09 DIAGNOSIS — I483 Typical atrial flutter: Secondary | ICD-10-CM

## 2014-06-09 DIAGNOSIS — E0590 Thyrotoxicosis, unspecified without thyrotoxic crisis or storm: Secondary | ICD-10-CM | POA: Diagnosis not present

## 2014-06-09 NOTE — Assessment & Plan Note (Signed)
She is doing well.  No dyspnea. Continue current meds I have encouraged her to ambulate on a regular basis.

## 2014-06-09 NOTE — Assessment & Plan Note (Signed)
Rachel Mcbride  is doing very well. She has not had any episodes of palpitations. Her rhythm is still normal. Continue same medications. I will see her again in one year.

## 2014-06-09 NOTE — Patient Instructions (Signed)
Your physician recommends that you continue on your current medications as directed. Please refer to the Current Medication list given to you today.  Your physician wants you to follow-up in: 1 year with Dr. Nahser.  You will receive a reminder letter in the mail two months in advance. If you don't receive a letter, please call our office to schedule the follow-up appointment.  

## 2014-06-09 NOTE — Progress Notes (Signed)
Rachel Mcbride Date of Birth  03/13/1946 [facility] [address]    [place], [place]  [address] [phone]  Fax  [phone]   Problems: 1. History breast cancer-status post chemotherapy 2. Atrial flutter - in the setting of post chemotherapy, pneumonia 3. COPD 4. Diastolic congestive heart failure  History of Present Illness:  68 year old female with a  history of atrial flutter. She also has a history of breast cancer.  She has received  chemotherapy and radiation.  SHe stopped smoking last year now smokes an electronic cigarette.  She has a history of COPD. she has had transient congestive heart failure but this seems to have resolved.   Her oxygen levels have been low but have been gradually improving.  She still has some generalized shortness of breath. She does not get any regular exercise. She denies any syncope or presyncope.  Because of her shortness breath we performed a stress test this past January. She was found to have no evidence of ischemia. She has normal left ventricular systolic function.  She was receiving chemotherapy. Her Port-A-Cath has now been removed.  She is now fairly active and is back to doing her normal pre-cancer activities.  She is walking some ( 600 feet to the mail box and back).  She is still having some problems with peripheral neuropathy.  She also has had worsened some degree of her scoliosis.   November 25, 2013:  Rachel Mcbride is doing well from a cardiac standpoint.  Slight fatigue.  She has been on ASA since having an episode of A-Flutter which was in the setting of post chemotherapy pneumonia - husband questions whether or not we can stop her cardiac meds.  She is not having any problems.  Oct. 14, 2014:  Rachel Mcbride is doing well - still has neuropathy issues.  Fatigue . No papitations.  She has started back on her ASA.    Oct. 28, 2015:  Rachel Mcbride is doing well. Is not exercising.      Current Outpatient Prescriptions on File  Prior to Visit  Medication Sig Dispense Refill  . alendronate (FOSAMAX) 35 MG tablet TAKE 1 TABLET EVERY WEEK  12 tablet  3  . anastrozole (ARIMIDEX) 1 MG tablet TAKE 1 TABLET EVERY DAY  90 tablet  3  . aspirin 81 MG tablet Take 81 mg by mouth daily.      . Calcium Carbonate (CALCIUM 600 PO) Take 2 capsules by mouth daily.       . Cholecalciferol (VITAMIN D PO) Take 1,000 Units by mouth daily.       Marland Kitchen lisinopril (PRINIVIL,ZESTRIL) 10 MG tablet TAKE 1 TABLET EVERY DAY  90 tablet  1   No current facility-administered medications on file prior to visit.    Allergies  Allergen Reactions  . Pneumococcal Vaccines Itching  . Shellfish-Derived Products Itching    Past Medical History  Diagnosis Date  . Breast cancer 04/2010  . Hypertension     under control; has been on med. x 2 yrs.  . CHF (congestive heart failure) 2011  . Atrial flutter 2011    occurred with CHF; no current dysrhythmia  . COPD (chronic obstructive pulmonary disease)     denies SOB with daily activities  . Pneumonia 2011  . Scoliosis     since chemo  . Neuropathy of leg     since chemo; right leg  . Cough 08/31/2011  . Anemia   . Osteopenia 12/31/2011  . Cataract  bilateral    Past Surgical History  Procedure Laterality Date  . Portacath placement  05/17/2010  . Mastectomy  04/20/2010    right  . Bronchoscopy  04/03/2010    with endobronchial ultrasound  . Port-a-cath removal  09/05/2011    Procedure: REMOVAL PORT-A-CATH;  Surgeon: Rolm Bookbinder, MD;  Location: [facility];  Service: General;  Laterality: Left;    History  Smoking status  . Former Smoker -- 2.00 packs/day for 50 years  . Types: Cigarettes  . Quit date: 07/14/2010  Smokeless tobacco  . Never Used    Comment: uses electronic cigarette    History  Alcohol Use  . 7.2 oz/week  . 12 Cans of beer per week    Comment: 2-3 x/week    Family History  Problem Relation Age of Onset  . Heart attack Mother   . Stroke  Mother   . Heart disease Sister   . Cancer Sister     ? mouth    Reviw of Systems:  Reviewed in the HPI.  All other systems are negative.  Physical Exam: BP 108/50  Pulse 60  Ht 5' 3.5" (1.613 m)  Wt 108 lb 9.6 oz (49.261 kg)  BMI 18.93 kg/m2 The patient is alert and oriented x 3.  The mood and affect are normal.   Skin: warm and dry.  Color is normal.    HEENT:   the sclera are nonicteric.  The mucous membranes are moist.  The carotids are 2+ without bruits.  There is no thyromegaly.  There is no JVD.    Lungs: clear.  The chest wall is non tender.    Heart: regular rate with a normal S1 and S2.  There are no murmurs, gallops, or rubs. The PMI is not displaced.     Abdomen: good bowel sounds.  There is no guarding or rebound.  There is no hepatosplenomegaly or tenderness.  There are no masses.   Extremities:  no clubbing, cyanosis, or edema.  The legs are without rashes.  The distal pulses are intact.   Neuro:  Cranial nerves II - XII are intact.  Motor and sensory functions are intact.    The gait is normal.  ECG: Oct. 28, 2015:  NSR  At 60.  No ST changes Assessment / Plan:

## 2014-06-14 DIAGNOSIS — E890 Postprocedural hypothyroidism: Secondary | ICD-10-CM | POA: Diagnosis not present

## 2014-06-22 ENCOUNTER — Ambulatory Visit: Payer: Medicare Other | Admitting: Hematology and Oncology

## 2014-06-22 ENCOUNTER — Telehealth: Payer: Self-pay | Admitting: *Deleted

## 2014-06-22 NOTE — Telephone Encounter (Signed)
No problem from my stand point but she should check with her cardiologist also

## 2014-06-22 NOTE — Telephone Encounter (Signed)
Pt wants to know if ok w/ Dr. Alvy Bimler to have Cataract surgery?

## 2014-06-22 NOTE — Telephone Encounter (Signed)
Left VM for pt ok to have Cataract surgery per Dr. Calton Dach standpoint.  Make sure to check w/  Cardiologist. Call us back if any questions.

## 2014-06-28 DIAGNOSIS — H25813 Combined forms of age-related cataract, bilateral: Secondary | ICD-10-CM | POA: Diagnosis not present

## 2014-06-28 DIAGNOSIS — H5213 Myopia, bilateral: Secondary | ICD-10-CM | POA: Diagnosis not present

## 2014-07-21 DIAGNOSIS — H25811 Combined forms of age-related cataract, right eye: Secondary | ICD-10-CM | POA: Diagnosis not present

## 2014-07-21 DIAGNOSIS — H2511 Age-related nuclear cataract, right eye: Secondary | ICD-10-CM | POA: Diagnosis not present

## 2014-08-30 DIAGNOSIS — H578 Other specified disorders of eye and adnexa: Secondary | ICD-10-CM | POA: Diagnosis not present

## 2014-08-30 DIAGNOSIS — T1581XA Foreign body in other and multiple parts of external eye, right eye, initial encounter: Secondary | ICD-10-CM | POA: Diagnosis not present

## 2014-08-30 DIAGNOSIS — H59021 Cataract (lens) fragments in eye following cataract surgery, right eye: Secondary | ICD-10-CM | POA: Diagnosis not present

## 2014-09-13 DIAGNOSIS — E890 Postprocedural hypothyroidism: Secondary | ICD-10-CM | POA: Diagnosis not present

## 2014-11-22 ENCOUNTER — Other Ambulatory Visit (HOSPITAL_BASED_OUTPATIENT_CLINIC_OR_DEPARTMENT_OTHER): Payer: Medicare Other

## 2014-11-22 ENCOUNTER — Encounter: Payer: Self-pay | Admitting: Hematology and Oncology

## 2014-11-22 ENCOUNTER — Ambulatory Visit (HOSPITAL_BASED_OUTPATIENT_CLINIC_OR_DEPARTMENT_OTHER): Payer: Medicare Other | Admitting: Hematology and Oncology

## 2014-11-22 ENCOUNTER — Telehealth: Payer: Self-pay | Admitting: Hematology and Oncology

## 2014-11-22 VITALS — BP 136/55 | HR 78 | Temp 97.5°F | Resp 18 | Ht 63.5 in | Wt 110.3 lb

## 2014-11-22 DIAGNOSIS — C50919 Malignant neoplasm of unspecified site of unspecified female breast: Secondary | ICD-10-CM | POA: Diagnosis not present

## 2014-11-22 DIAGNOSIS — Z170 Estrogen receptor positive status [ER+]: Secondary | ICD-10-CM

## 2014-11-22 DIAGNOSIS — D638 Anemia in other chronic diseases classified elsewhere: Secondary | ICD-10-CM | POA: Diagnosis not present

## 2014-11-22 DIAGNOSIS — C50911 Malignant neoplasm of unspecified site of right female breast: Secondary | ICD-10-CM | POA: Diagnosis not present

## 2014-11-22 DIAGNOSIS — R309 Painful micturition, unspecified: Secondary | ICD-10-CM | POA: Diagnosis not present

## 2014-11-22 DIAGNOSIS — M8580 Other specified disorders of bone density and structure, unspecified site: Secondary | ICD-10-CM

## 2014-11-22 DIAGNOSIS — R300 Dysuria: Secondary | ICD-10-CM

## 2014-11-22 DIAGNOSIS — C773 Secondary and unspecified malignant neoplasm of axilla and upper limb lymph nodes: Secondary | ICD-10-CM

## 2014-11-22 HISTORY — DX: Dysuria: R30.0

## 2014-11-22 LAB — CBC WITH DIFFERENTIAL/PLATELET
BASO%: 0.9 % (ref 0.0–2.0)
BASOS ABS: 0 10*3/uL (ref 0.0–0.1)
EOS%: 3.6 % (ref 0.0–7.0)
Eosinophils Absolute: 0.2 10*3/uL (ref 0.0–0.5)
HCT: 34.6 % — ABNORMAL LOW (ref 34.8–46.6)
HEMOGLOBIN: 11.1 g/dL — AB (ref 11.6–15.9)
LYMPH#: 0.5 10*3/uL — AB (ref 0.9–3.3)
LYMPH%: 9.6 % — ABNORMAL LOW (ref 14.0–49.7)
MCH: 29.6 pg (ref 25.1–34.0)
MCHC: 32.1 g/dL (ref 31.5–36.0)
MCV: 92.4 fL (ref 79.5–101.0)
MONO#: 0.4 10*3/uL (ref 0.1–0.9)
MONO%: 8.3 % (ref 0.0–14.0)
NEUT%: 77.6 % — ABNORMAL HIGH (ref 38.4–76.8)
NEUTROS ABS: 4 10*3/uL (ref 1.5–6.5)
Platelets: 205 10*3/uL (ref 145–400)
RBC: 3.74 10*6/uL (ref 3.70–5.45)
RDW: 12.8 % (ref 11.2–14.5)
WBC: 5.1 10*3/uL (ref 3.9–10.3)

## 2014-11-22 LAB — URINALYSIS, MICROSCOPIC - CHCC
Bilirubin (Urine): NEGATIVE
Glucose: NEGATIVE mg/dL
Ketones: NEGATIVE mg/dL
NITRITE: NEGATIVE
PH: 7 (ref 4.6–8.0)
Protein: NEGATIVE mg/dL
SPECIFIC GRAVITY, URINE: 1.01 (ref 1.003–1.035)
Urobilinogen, UR: 0.2 mg/dL (ref 0.2–1)

## 2014-11-22 LAB — COMPREHENSIVE METABOLIC PANEL (CC13)
ALBUMIN: 3.9 g/dL (ref 3.5–5.0)
ALK PHOS: 47 U/L (ref 40–150)
ALT: 8 U/L (ref 0–55)
AST: 19 U/L (ref 5–34)
Anion Gap: 11 mEq/L (ref 3–11)
BUN: 22.7 mg/dL (ref 7.0–26.0)
CALCIUM: 10.4 mg/dL (ref 8.4–10.4)
CO2: 28 meq/L (ref 22–29)
Chloride: 101 mEq/L (ref 98–109)
Creatinine: 0.9 mg/dL (ref 0.6–1.1)
EGFR: 61 mL/min/{1.73_m2} — AB (ref >90)
GLUCOSE: 74 mg/dL (ref 70–140)
POTASSIUM: 4.3 meq/L (ref 3.5–5.1)
Sodium: 141 mEq/L (ref 136–145)
Total Bilirubin: 0.48 mg/dL (ref 0.20–1.20)
Total Protein: 7.4 g/dL (ref 6.4–8.3)

## 2014-11-22 MED ORDER — ANASTROZOLE 1 MG PO TABS
1.0000 mg | ORAL_TABLET | Freq: Every day | ORAL | Status: DC
Start: 2014-11-22 — End: 2015-09-07

## 2014-11-22 MED ORDER — AMOXICILLIN 500 MG PO TABS: 500.0000 mg | ORAL_TABLET | Freq: Two times a day (BID) | ORAL | Status: DC

## 2014-11-22 NOTE — Telephone Encounter (Signed)
Gave patient avs report and appoints for bone density at the [facility] 11/25/14 and lab/NG April 2017.

## 2014-11-23 ENCOUNTER — Telehealth: Payer: Self-pay | Admitting: *Deleted

## 2014-11-23 LAB — CANCER ANTIGEN 27.29: CA 27.29: 16 U/mL (ref 0–39)

## 2014-11-23 NOTE — Telephone Encounter (Signed)
-----   Message from Heath Lark, MD sent at 11/23/2014  1:46 PM EDT ----- Regarding: start antibiotic UA is looking like UTI She was given antibiotics script yesterday, Ok to start

## 2014-11-23 NOTE — Telephone Encounter (Signed)
Informed pt of u/a results and need to start on Augmentin as prescribed by Dr. Alvy Bimler.  Pt verbalized understanding, states will not be able to p/u Rx until tomorrow since her husband is working and won't be home til late tonight.  She requested RN call Rx [facility] although she does have the paper Rx from Dr. Alvy Bimler.  Called Augmentin in to new rx line at [facility].

## 2014-11-23 NOTE — Assessment & Plan Note (Addendum)
The patient is symptomatic. Urinalysis showed increased white blood cells and nitrates. I recommend a trial of antibiotic therapy.

## 2014-11-23 NOTE — Progress Notes (Signed)
[facility] OFFICE PROGRESS NOTE  Patient Care Team: Leonard Downing, MD as PCP - General (Family Medicine) Rolm Bookbinder, MD (General Surgery) Gery Pray, MD (Radiation Oncology) Rigoberto Noel, MD (Pulmonary Disease) Heath Lark, MD as Consulting Physician (Hematology and Oncology)  SUMMARY OF ONCOLOGIC HISTORY:  This patient was diagnosed with [id number] (stage IIIA) invasive ductal carcinoma measuring 2.4 cm, grade 2 out of 3, status post right mastectomy with axillary lymph node dissection on April 20, 2010, with negative surgical margins; 6 out of 17 lymph nodes positive, with extracapsular extension. There was lymphovascular invasion. ER 81%, PR 64%, Ki-67 of 16%, HER-2/neu by CISH was negative ratio of 1.46. She had subcarinal adenopathy positive on PET scan in 03/2010; however, EBUS FNA was negative. Follow up CT chest in 07/2010 showed stable calcified subcarinal node. She has been followed by Dr. Elsworth Soho. She received adjuvant chemotherapy with dose dense AC followed by weekly Taxol finished on 09/21/2010. She terminated Taxol prematurely due to neuropathy, fatigue.  She had been on Arimidex since May of 2012. In May of 2015, she has unexplained weight loss and was subsequently found to have Graves' disease. She received radioactive iodine treatment.  INTERVAL HISTORY: Please see below for problem oriented charting. She denies any recent abnormal breast examination, palpable mass, abnormal breast appearance or nipple changes She complained of dysuria and urinary frequency. No hematuria. Denies bone pain  REVIEW OF SYSTEMS:   Constitutional: Denies fevers, chills or abnormal weight loss Eyes: Denies blurriness of vision Ears, nose, mouth, throat, and face: Denies mucositis or sore throat Respiratory: Denies cough, dyspnea or wheezes Cardiovascular: Denies palpitation, chest discomfort or lower extremity swelling Gastrointestinal:  Denies nausea, heartburn or  change in bowel habits Skin: Denies abnormal skin rashes Lymphatics: Denies new lymphadenopathy or easy bruising Neurological:Denies numbness, tingling or new weaknesses Behavioral/Psych: Mood is stable, no new changes  All other systems were reviewed with the patient and are negative.  I have reviewed the past medical history, past surgical history, social history and family history with the patient and they are unchanged from previous note.  ALLERGIES:  is allergic to pneumococcal vaccines and shellfish-derived products.  MEDICATIONS:  Current Outpatient Prescriptions  Medication Sig Dispense Refill  . alendronate (FOSAMAX) 35 MG tablet TAKE 1 TABLET EVERY WEEK 12 tablet 3  . anastrozole (ARIMIDEX) 1 MG tablet Take 1 tablet (1 mg total) by mouth daily. 90 tablet 3  . aspirin 81 MG tablet Take 81 mg by mouth daily.    . Calcium Carbonate (CALCIUM 600 PO) Take 2 capsules by mouth daily.     . Cholecalciferol (VITAMIN D PO) Take 1,000 Units by mouth daily.     Marland Kitchen levothyroxine (SYNTHROID, LEVOTHROID) 75 MCG tablet Take 75 mcg by mouth daily.    Marland Kitchen lisinopril (PRINIVIL,ZESTRIL) 10 MG tablet TAKE 1 TABLET EVERY DAY 90 tablet 1  . amoxicillin (AMOXIL) 500 MG tablet Take 1 tablet (500 mg total) by mouth 2 (two) times daily. 10 tablet 0   No current facility-administered medications for this visit.    PHYSICAL EXAMINATION: ECOG PERFORMANCE STATUS: 1 - Symptomatic but completely ambulatory  Filed Vitals:   11/22/14 1029  BP: 136/55  Pulse: 78  Temp: 97.5 F (36.4 C)  Resp: 18   Filed Weights   11/22/14 1029  Weight: 110 lb 4.8 oz (50.032 kg)    GENERAL:alert, no distress and comfortable SKIN: skin color, texture, turgor are normal, no rashes or significant lesions EYES: normal, Conjunctiva are  pink and non-injected, sclera clear OROPHARYNX:no exudate, no erythema and lips, buccal mucosa, and tongue normal  NECK: supple, thyroid normal size, non-tender, without nodularity LYMPH:   no palpable lymphadenopathy in the cervical, axillary or inguinal LUNGS: clear to auscultation and percussion with normal breathing effort HEART: regular rate & rhythm and no murmurs and no lower extremity edema ABDOMEN:abdomen soft, non-tender and normal bowel sounds Musculoskeletal:no cyanosis of digits and no clubbing  NEURO: alert & oriented x 3 with fluent speech, no focal motor/sensory deficits Well-healed mastectomy scar on the right breast. Normal breast exam on the left. LABORATORY DATA:  I have reviewed the data as listed    Component Value Date/Time   NA 141 11/22/2014 1005   NA 136 03/22/2014 1227   K 4.3 11/22/2014 1005   K 4.8 03/22/2014 1227   CL 99 03/22/2014 1227   CL 104 12/24/2012 0912   CO2 28 11/22/2014 1005   CO2 31 03/22/2014 1227   GLUCOSE 74 11/22/2014 1005   GLUCOSE 69* 03/22/2014 1227   GLUCOSE 87 12/24/2012 0912   BUN 22.7 11/22/2014 1005   BUN 18 03/22/2014 1227   CREATININE 0.9 11/22/2014 1005   CREATININE 0.89 03/22/2014 1227   CALCIUM 10.4 11/22/2014 1005   CALCIUM 9.7 03/22/2014 1227   PROT 7.4 11/22/2014 1005   PROT 6.7 03/22/2014 1227   ALBUMIN 3.9 11/22/2014 1005   ALBUMIN 4.0 03/22/2014 1227   AST 19 11/22/2014 1005   AST 18 03/22/2014 1227   ALT 8 11/22/2014 1005   ALT <8 03/22/2014 1227   ALKPHOS 47 11/22/2014 1005   ALKPHOS 48 03/22/2014 1227   BILITOT 0.48 11/22/2014 1005   BILITOT 0.4 03/22/2014 1227   GFRNONAA 86* 09/03/2011 1050   GFRAA >90 09/03/2011 1050    No results found for: SPEP, UPEP  Lab Results  Component Value Date   WBC 5.1 11/22/2014   NEUTROABS 4.0 11/22/2014   HGB 11.1* 11/22/2014   HCT 34.6* 11/22/2014   MCV 92.4 11/22/2014   PLT 205 11/22/2014      Chemistry      Component Value Date/Time   NA 141 11/22/2014 1005   NA 136 03/22/2014 1227   K 4.3 11/22/2014 1005   K 4.8 03/22/2014 1227   CL 99 03/22/2014 1227   CL 104 12/24/2012 0912   CO2 28 11/22/2014 1005   CO2 31 03/22/2014 1227   BUN  22.7 11/22/2014 1005   BUN 18 03/22/2014 1227   CREATININE 0.9 11/22/2014 1005   CREATININE 0.89 03/22/2014 1227      Component Value Date/Time   CALCIUM 10.4 11/22/2014 1005   CALCIUM 9.7 03/22/2014 1227   ALKPHOS 47 11/22/2014 1005   ALKPHOS 48 03/22/2014 1227   AST 19 11/22/2014 1005   AST 18 03/22/2014 1227   ALT 8 11/22/2014 1005   ALT <8 03/22/2014 1227   BILITOT 0.48 11/22/2014 1005   BILITOT 0.4 03/22/2014 1227       ASSESSMENT & PLAN:  Breast cancer Clinically, she has no evidence of recurrence. She will continue her Arimidex treatment until May of 2017. I will see her back next year with history, physical examination and blood work. She will continue yearly screening mammogram on the contralateral breast. I will order that for next week.   Anemia in chronic illness This is likely anemia of chronic disease. The patient denies recent history of bleeding such as epistaxis, hematuria or hematochezia. She is asymptomatic from the anemia. We will observe for  now.  She does not require transfusion now.     Dysuria The patient is symptomatic. Urinalysis showed increased white blood cells and nitrates. I recommend a trial of antibiotic therapy.   Osteopenia I encouraged her to take calcium and vitamin D supplement. We will recheck bone density scan.    Orders Placed This Encounter  Procedures  . Urine culture    Standing Status: Future     Number of Occurrences: 1     Standing Expiration Date: 12/27/2015  . DG Bone Density    Wt-110/pf 04/11/2012 bcg/no needs/ins-mcr,bcbs/np/melissa with epic order    Standing Status: Future     Number of Occurrences:      Standing Expiration Date: 11/22/2015    Order Specific Question:  Reason for Exam (SYMPTOM  OR DIAGNOSIS REQUIRED)    Answer:  osteoporosis    Order Specific Question:  Preferred imaging location?    Answer:  [facility]  . Urinalysis, Microscopic - [facility]    Standing Status: Future     Number of  Occurrences: 1     Standing Expiration Date: 12/27/2015   All questions were answered. The patient knows to call the clinic with any problems, questions or concerns. No barriers to learning was detected. I spent 25 minutes counseling the patient face to face. The total time spent in the appointment was 30 minutes and more than 50% was on counseling and review of test results     [facility], [place], MD 11/23/2014 2:14 PM

## 2014-11-23 NOTE — Assessment & Plan Note (Signed)
This is likely anemia of chronic disease. The patient denies recent history of bleeding such as epistaxis, hematuria or hematochezia. She is asymptomatic from the anemia. We will observe for now.  She does not require transfusion now.   

## 2014-11-23 NOTE — Assessment & Plan Note (Signed)
I encouraged her to take calcium and vitamin D supplement. We will recheck bone density scan.

## 2014-11-23 NOTE — Assessment & Plan Note (Signed)
Clinically, she has no evidence of recurrence. She will continue her Arimidex treatment until May of 2017. I will see her back next year with history, physical examination and blood work. She will continue yearly screening mammogram on the contralateral breast. I will order that for next week.

## 2014-11-25 ENCOUNTER — Ambulatory Visit
Admission: RE | Admit: 2014-11-25 | Discharge: 2014-11-25 | Disposition: A | Discharge status: Home / Self Care | Payer: Medicare Other | Source: Ambulatory Visit | Attending: Hematology and Oncology | Admitting: Hematology and Oncology

## 2014-11-25 DIAGNOSIS — M8580 Other specified disorders of bone density and structure, unspecified site: Secondary | ICD-10-CM

## 2014-11-25 DIAGNOSIS — M85852 Other specified disorders of bone density and structure, left thigh: Secondary | ICD-10-CM | POA: Diagnosis not present

## 2014-11-25 DIAGNOSIS — C50911 Malignant neoplasm of unspecified site of right female breast: Secondary | ICD-10-CM

## 2014-11-25 DIAGNOSIS — M85831 Other specified disorders of bone density and structure, right forearm: Secondary | ICD-10-CM | POA: Diagnosis not present

## 2014-11-25 LAB — URINE CULTURE

## 2014-12-07 ENCOUNTER — Telehealth: Payer: Self-pay | Admitting: *Deleted

## 2014-12-07 NOTE — Telephone Encounter (Signed)
TC from patient requesting results of bone density scan. Read Dr. Calton Dach note to her regarding results and continuing fosamax, calcium and increasing Vit. D to 2000 units daily.  Pt. Voiced understanding.

## 2014-12-07 NOTE — Telephone Encounter (Signed)
Tell her bone density almost the same. I recommend she continues fosamax, calcium and to increase vitamin D to 2000 units daily

## 2014-12-07 NOTE — Telephone Encounter (Signed)
LEFT MESSAGE ON PT.'S HOME VOICE MAIL TO RETURN A CALL CONCERNING THE RESULTS OF HER BONE DENSITY.

## 2014-12-07 NOTE — Telephone Encounter (Signed)
PT. WOULD LIKE THE RESULTS OF HER BONE DENSITY DONE ON 11/25/14. THIS NOTE ROUTED TO [place] AND TAMMI HOLLAND.

## 2014-12-28 ENCOUNTER — Other Ambulatory Visit: Payer: Self-pay | Admitting: Hematology and Oncology

## 2015-02-01 ENCOUNTER — Other Ambulatory Visit: Payer: Self-pay | Admitting: Cardiovascular Disease

## 2015-03-14 ENCOUNTER — Other Ambulatory Visit: Payer: Self-pay | Admitting: Hematology and Oncology

## 2015-03-14 DIAGNOSIS — Z1231 Encounter for screening mammogram for malignant neoplasm of breast: Secondary | ICD-10-CM

## 2015-03-15 ENCOUNTER — Telehealth: Payer: Self-pay | Admitting: *Deleted

## 2015-03-15 NOTE — Telephone Encounter (Signed)
Pt asking for suggestions about where to shop for Mastectomy bras.  I gave her the number for [employer] and she will try them.

## 2015-04-26 ENCOUNTER — Ambulatory Visit
Admission: RE | Admit: 2015-04-26 | Discharge: 2015-04-26 | Disposition: A | Discharge status: Home / Self Care | Payer: Medicare Other | Source: Ambulatory Visit | Attending: Hematology and Oncology | Admitting: Hematology and Oncology

## 2015-04-26 ENCOUNTER — Other Ambulatory Visit: Payer: Self-pay | Admitting: Hematology and Oncology

## 2015-04-26 DIAGNOSIS — Z1231 Encounter for screening mammogram for malignant neoplasm of breast: Secondary | ICD-10-CM

## 2015-05-24 DIAGNOSIS — Z23 Encounter for immunization: Secondary | ICD-10-CM | POA: Diagnosis not present

## 2015-06-07 ENCOUNTER — Telehealth: Payer: Self-pay

## 2015-06-07 NOTE — Telephone Encounter (Signed)
Pt called to clarify if she has an appt at [facility]. Told her it was in April.

## 2015-06-09 ENCOUNTER — Encounter: Payer: Self-pay | Admitting: Cardiovascular Disease

## 2015-06-09 ENCOUNTER — Ambulatory Visit (INDEPENDENT_AMBULATORY_CARE_PROVIDER_SITE_OTHER): Payer: Medicare Other | Admitting: Cardiovascular Disease

## 2015-06-09 VITALS — BP 100/56 | HR 68 | Ht 63.5 in | Wt 110.0 lb

## 2015-06-09 DIAGNOSIS — I10 Essential (primary) hypertension: Secondary | ICD-10-CM | POA: Diagnosis not present

## 2015-06-09 DIAGNOSIS — I483 Typical atrial flutter: Secondary | ICD-10-CM | POA: Diagnosis not present

## 2015-06-09 DIAGNOSIS — I5032 Chronic diastolic (congestive) heart failure: Secondary | ICD-10-CM | POA: Diagnosis not present

## 2015-06-09 NOTE — Patient Instructions (Signed)
Medication Instructions:  Your physician recommends that you continue on your current medications as directed. Please refer to the Current Medication list given to you today.   Labwork: None Ordered   Testing/Procedures: None Ordered   Follow-Up: Your physician wants you to follow-up in: 1 year with Dr. Nahser.  You will receive a reminder letter in the mail two months in advance. If you don't receive a letter, please call our office to schedule the follow-up appointment.   If you need a refill on your cardiac medications before your next appointment, please call your pharmacy.   Thank you for choosing [facility]! Zadia Uhde, RN [phone]    

## 2015-06-09 NOTE — Progress Notes (Signed)
Rachel Mcbride Date of Birth  11/30/1945 [facility] [address]    [place], [place]  [address] [phone]  Fax  [phone]   Problems: 1. History breast cancer-status post chemotherapy 2. Atrial flutter - in the setting of post chemotherapy, pneumonia 3. COPD 4. Diastolic congestive heart failure  History of Present Illness:  69 year old female with a  history of atrial flutter. She also has a history of breast cancer.  She has received  chemotherapy and radiation.  SHe stopped smoking last year now smokes an electronic cigarette.  She has a history of COPD. she has had transient congestive heart failure but this seems to have resolved.   Her oxygen levels have been low but have been gradually improving.  She still has some generalized shortness of breath. She does not get any regular exercise. She denies any syncope or presyncope.  Because of her shortness breath we performed a stress test this past January. She was found to have no evidence of ischemia. She has normal left ventricular systolic function.  She was receiving chemotherapy. Her Port-A-Cath has now been removed.  She is now fairly active and is back to doing her normal pre-cancer activities.  She is walking some ( 600 feet to the mail box and back).  She is still having some problems with peripheral neuropathy.  She also has had worsened some degree of her scoliosis.   November 25, 2013:  Rachel Mcbride is doing well from a cardiac standpoint.  Slight fatigue.  She has been on ASA since having an episode of A-Flutter which was in the setting of post chemotherapy pneumonia - husband questions whether or not we can stop her cardiac meds.  She is not having any problems.  Oct. 14, 2014:  Rachel Mcbride is doing well - still has neuropathy issues.  Fatigue . No papitations.  She has started back on her ASA.    Oct. 28, 2015:  Rachel Mcbride is doing well. Is not exercising.      Oct. 27, 2016:  Doing ok. Still has  DOE walking to [employer].  ( 600 feet each way )  Does her own shopping .   Current Outpatient Prescriptions on File Prior to Visit  Medication Sig Dispense Refill  . alendronate (FOSAMAX) 35 MG tablet TAKE 1 TABLET EVERY WEEK 12 tablet 3  . anastrozole (ARIMIDEX) 1 MG tablet Take 1 tablet (1 mg total) by mouth daily. 90 tablet 3  . aspirin 81 MG tablet Take 81 mg by mouth daily.    . Calcium Carbonate (CALCIUM 600 PO) Take 2 capsules by mouth daily.     . Cholecalciferol (VITAMIN D PO) Take 1,000 Units by mouth daily.     Marland Kitchen levothyroxine (SYNTHROID, LEVOTHROID) 75 MCG tablet Take 75 mcg by mouth daily.    Marland Kitchen lisinopril (PRINIVIL,ZESTRIL) 10 MG tablet TAKE 1 TABLET EVERY DAY 90 tablet 3   No current facility-administered medications on file prior to visit.    Allergies  Allergen Reactions  . Pneumococcal Vaccines Itching  . Shellfish-Derived Products Itching    Past Medical History  Diagnosis Date  . Breast cancer ([place]) 04/2010  . Hypertension     under control; has been on med. x 2 yrs.  . CHF (congestive heart failure) ([place]) 2011  . Atrial flutter ([place]) 2011    occurred with CHF; no current dysrhythmia  . COPD (chronic obstructive pulmonary disease) ([place])     denies SOB with daily activities  . Pneumonia  2011  . Scoliosis     since chemo  . Neuropathy of leg     since chemo; right leg  . Cough 08/31/2011  . Anemia   . Osteopenia 12/31/2011  . Cataract     bilateral  . Dysuria 11/22/2014    Past Surgical History  Procedure Laterality Date  . Portacath placement  05/17/2010  . Mastectomy  04/20/2010    right  . Bronchoscopy  04/03/2010    with endobronchial ultrasound  . Port-a-cath removal  09/05/2011    Procedure: REMOVAL PORT-A-CATH;  Surgeon: Rolm Bookbinder, MD;  Location: [facility];  Service: General;  Laterality: Left;    History  Smoking status  . Former Smoker -- 2.00 packs/day for 50 years  . Types: Cigarettes  . Quit date: 07/14/2010   Smokeless tobacco  . Never Used    Comment: uses electronic cigarette    History  Alcohol Use  . 7.2 oz/week  . 12 Cans of beer per week    Comment: 2-3 x/week    Family History  Problem Relation Age of Onset  . Heart attack Mother   . Stroke Mother   . Heart disease Sister   . Cancer Sister     ? mouth    Reviw of Systems:  Reviewed in the HPI.  All other systems are negative.  Physical Exam: BP 100/56 mmHg  Pulse 68  Ht 5' 3.5" (1.613 m)  Wt 110 lb (49.896 kg)  BMI 19.18 kg/m2 The patient is alert and oriented x 3.  The mood and affect are normal.   Skin: warm and dry.  Color is normal.    HEENT:   the sclera are nonicteric.  The mucous membranes are moist.  The carotids are 2+ without bruits.  There is no thyromegaly.  There is no JVD.    Lungs: clear.  The chest wall is non tender.    Heart: regular rate with a normal S1 and S2.  There are no murmurs, gallops, or rubs. The PMI is not displaced.     Abdomen: good bowel sounds.  There is no guarding or rebound.  There is no hepatosplenomegaly or tenderness.  There are no masses.   Extremities:  no clubbing, cyanosis, or edema.  The legs are without rashes.  The distal pulses are intact.   Neuro:  Cranial nerves II - XII are intact.  Motor and sensory functions are intact.    The gait is normal.  ECG: Oct. 27, 2016: Normal sinus rhythm at 68. She has no ST or T wave changes. Assessment / Plan:   1. History breast cancer-status post chemotherapy 2. Atrial flutter - in the setting of post chemotherapy, pneumonia, maintaining NSR  3. COPD 4. Diastolic congestive heart failure-   She has some DOE walking to her mail box but is otherwise stable .  No changes in meds at this point     Faigy Stretch, Wonda Cheng, MD  06/09/2015 10:59 AM    [place],  [place], [place]  [address] Pager [phone] Phone: [phone]; Fax: [phone]   [facility]   [address] [place],   [address] [phone]   Fax [phone]

## 2015-06-14 DIAGNOSIS — H25812 Combined forms of age-related cataract, left eye: Secondary | ICD-10-CM | POA: Diagnosis not present

## 2015-06-15 DIAGNOSIS — E890 Postprocedural hypothyroidism: Secondary | ICD-10-CM | POA: Diagnosis not present

## 2015-06-20 DIAGNOSIS — E890 Postprocedural hypothyroidism: Secondary | ICD-10-CM | POA: Diagnosis not present

## 2015-07-12 DIAGNOSIS — I10 Essential (primary) hypertension: Secondary | ICD-10-CM | POA: Diagnosis not present

## 2015-07-12 DIAGNOSIS — G629 Polyneuropathy, unspecified: Secondary | ICD-10-CM | POA: Diagnosis not present

## 2015-07-12 DIAGNOSIS — H25812 Combined forms of age-related cataract, left eye: Secondary | ICD-10-CM | POA: Diagnosis not present

## 2015-07-12 DIAGNOSIS — Z87891 Personal history of nicotine dependence: Secondary | ICD-10-CM | POA: Diagnosis not present

## 2015-07-12 DIAGNOSIS — Z7982 Long term (current) use of aspirin: Secondary | ICD-10-CM | POA: Diagnosis not present

## 2015-07-12 DIAGNOSIS — Z79899 Other long term (current) drug therapy: Secondary | ICD-10-CM | POA: Diagnosis not present

## 2015-07-12 DIAGNOSIS — I4891 Unspecified atrial fibrillation: Secondary | ICD-10-CM | POA: Diagnosis not present

## 2015-07-12 DIAGNOSIS — E039 Hypothyroidism, unspecified: Secondary | ICD-10-CM | POA: Diagnosis not present

## 2015-07-12 DIAGNOSIS — J449 Chronic obstructive pulmonary disease, unspecified: Secondary | ICD-10-CM | POA: Diagnosis not present

## 2015-07-19 DIAGNOSIS — H25812 Combined forms of age-related cataract, left eye: Secondary | ICD-10-CM | POA: Diagnosis not present

## 2015-08-04 ENCOUNTER — Telehealth: Payer: Self-pay | Admitting: *Deleted

## 2015-08-04 NOTE — Telephone Encounter (Signed)
FYI Patient called to wish Dr. Alvy Bimler and staff to have a Merry Christmas!.  Will notify MD and call forwarded.

## 2015-08-26 DIAGNOSIS — L601 Onycholysis: Secondary | ICD-10-CM | POA: Diagnosis not present

## 2015-08-26 DIAGNOSIS — Z049 Encounter for examination and observation for unspecified reason: Secondary | ICD-10-CM | POA: Diagnosis not present

## 2015-08-26 DIAGNOSIS — Z0000 Encounter for general adult medical examination without abnormal findings: Secondary | ICD-10-CM | POA: Diagnosis not present

## 2015-08-26 DIAGNOSIS — R5381 Other malaise: Secondary | ICD-10-CM | POA: Diagnosis not present

## 2015-08-26 DIAGNOSIS — Z79899 Other long term (current) drug therapy: Secondary | ICD-10-CM | POA: Diagnosis not present

## 2015-08-26 DIAGNOSIS — I10 Essential (primary) hypertension: Secondary | ICD-10-CM | POA: Diagnosis not present

## 2015-08-26 DIAGNOSIS — E039 Hypothyroidism, unspecified: Secondary | ICD-10-CM | POA: Diagnosis not present

## 2015-08-26 DIAGNOSIS — D649 Anemia, unspecified: Secondary | ICD-10-CM | POA: Diagnosis not present

## 2015-09-07 ENCOUNTER — Other Ambulatory Visit: Payer: Self-pay | Admitting: Hematology and Oncology

## 2015-09-15 ENCOUNTER — Telehealth: Payer: Self-pay | Admitting: *Deleted

## 2015-09-15 NOTE — Telephone Encounter (Signed)
"  Physical two weeks ago, my lab at Dr. Arelia Sneddon office revealed Hgb = 10.9.  I feel tired, cold, and sleep when I sit down up to four hours at a time.  Scheduled F/u is November 21, 2015.  I may need to be seen sooner.  I am not iron defficient so why is this happening.  It takes effort to do activities."  Denies changes in her s.o.b from being a heavy smoker. Chest pain only with cough.  No signs of bleeding in urine or stools.  "Bowel movements at times are balls larger than size of pea."  Discussed hydrating with 64 oz water daily and diet options for bowels.   FYI Asked about collaborative nurse, to notify her she said  hello.

## 2015-09-16 ENCOUNTER — Telehealth: Payer: Self-pay | Admitting: *Deleted

## 2015-09-16 ENCOUNTER — Other Ambulatory Visit: Payer: Self-pay | Admitting: *Deleted

## 2015-09-16 NOTE — Telephone Encounter (Signed)
Labs and office note received from Dr. Tretha Sciara office and placed on Dr. Calton Dach desk for review.  Pt did call back later and left another VM stating when she saw Dr. Chalmers Cater last month she was told her Thyroid level was ok and to continue taking same dose medication.  Pt says she is just wondering if Dr. Alvy Bimler needs to see her sooner due to the low hgb and she is feeling tired all the time?

## 2015-09-16 NOTE — Telephone Encounter (Signed)
Her last thyroid function test is abnromal Did Dr. Welton Flakes checked her TSH Can we call to get a copy of the test results?

## 2015-09-16 NOTE — Telephone Encounter (Signed)
See Collaborative note.

## 2015-09-16 NOTE — Telephone Encounter (Signed)
LVM for pt to see if she had Thyroid level checked at Dr. Arelia Sneddon.   Called Dr. Arelia Sneddon office and requested they fax over any labs and office note on pt for Dr. Alvy Bimler.

## 2015-09-19 ENCOUNTER — Telehealth: Payer: Self-pay | Admitting: Hematology and Oncology

## 2015-09-19 ENCOUNTER — Other Ambulatory Visit: Payer: Self-pay | Admitting: Hematology and Oncology

## 2015-09-19 DIAGNOSIS — D638 Anemia in other chronic diseases classified elsewhere: Secondary | ICD-10-CM

## 2015-09-19 NOTE — Telephone Encounter (Signed)
I placed order to see her on Wed to address her issue

## 2015-09-19 NOTE — Telephone Encounter (Signed)
cld & spoke to pt and gave pt time & dte of appt for 2/8

## 2015-09-21 ENCOUNTER — Ambulatory Visit (HOSPITAL_BASED_OUTPATIENT_CLINIC_OR_DEPARTMENT_OTHER): Payer: Medicare Other | Admitting: Hematology and Oncology

## 2015-09-21 ENCOUNTER — Telehealth: Payer: Self-pay | Admitting: Hematology and Oncology

## 2015-09-21 ENCOUNTER — Encounter: Payer: Self-pay | Admitting: Hematology and Oncology

## 2015-09-21 ENCOUNTER — Other Ambulatory Visit (HOSPITAL_BASED_OUTPATIENT_CLINIC_OR_DEPARTMENT_OTHER): Payer: Medicare Other

## 2015-09-21 VITALS — BP 119/59 | HR 93 | Temp 98.4°F | Resp 18 | Wt 108.6 lb

## 2015-09-21 DIAGNOSIS — D638 Anemia in other chronic diseases classified elsewhere: Secondary | ICD-10-CM

## 2015-09-21 DIAGNOSIS — Z79811 Long term (current) use of aromatase inhibitors: Secondary | ICD-10-CM | POA: Diagnosis not present

## 2015-09-21 DIAGNOSIS — R053 Chronic cough: Secondary | ICD-10-CM

## 2015-09-21 DIAGNOSIS — R634 Abnormal weight loss: Secondary | ICD-10-CM | POA: Diagnosis not present

## 2015-09-21 DIAGNOSIS — R05 Cough: Secondary | ICD-10-CM

## 2015-09-21 DIAGNOSIS — C50411 Malignant neoplasm of upper-outer quadrant of right female breast: Secondary | ICD-10-CM

## 2015-09-21 DIAGNOSIS — R911 Solitary pulmonary nodule: Secondary | ICD-10-CM | POA: Diagnosis not present

## 2015-09-21 DIAGNOSIS — R5383 Other fatigue: Secondary | ICD-10-CM | POA: Diagnosis not present

## 2015-09-21 DIAGNOSIS — R5382 Chronic fatigue, unspecified: Secondary | ICD-10-CM

## 2015-09-21 HISTORY — DX: Solitary pulmonary nodule: R91.1

## 2015-09-21 HISTORY — DX: Abnormal weight loss: R63.4

## 2015-09-21 HISTORY — DX: Chronic cough: R05.3

## 2015-09-21 LAB — CBC & DIFF AND RETIC
BASO%: 1.2 % (ref 0.0–2.0)
Basophils Absolute: 0.1 10*3/uL (ref 0.0–0.1)
EOS%: 4.2 % (ref 0.0–7.0)
Eosinophils Absolute: 0.2 10*3/uL (ref 0.0–0.5)
HCT: 34.4 % — ABNORMAL LOW (ref 34.8–46.6)
HEMOGLOBIN: 11 g/dL — AB (ref 11.6–15.9)
IMMATURE RETIC FRACT: 5.7 % (ref 1.60–10.00)
LYMPH%: 12.2 % — ABNORMAL LOW (ref 14.0–49.7)
MCH: 29.6 pg (ref 25.1–34.0)
MCHC: 32 g/dL (ref 31.5–36.0)
MCV: 92.7 fL (ref 79.5–101.0)
MONO#: 0.6 10*3/uL (ref 0.1–0.9)
MONO%: 14.1 % — ABNORMAL HIGH (ref 0.0–14.0)
NEUT#: 2.8 10*3/uL (ref 1.5–6.5)
NEUT%: 68.3 % (ref 38.4–76.8)
Platelets: 210 10*3/uL (ref 145–400)
RBC: 3.71 10*6/uL (ref 3.70–5.45)
RDW: 13 % (ref 11.2–14.5)
Retic %: 1.02 % (ref 0.70–2.10)
Retic Ct Abs: 37.84 10*3/uL (ref 33.70–90.70)
WBC: 4 10*3/uL (ref 3.9–10.3)
lymph#: 0.5 10*3/uL — ABNORMAL LOW (ref 0.9–3.3)

## 2015-09-21 NOTE — Progress Notes (Signed)
[facility] OFFICE PROGRESS NOTE  Patient Care Team: Leonard Downing, MD as PCP - General (Family Medicine) Rolm Bookbinder, MD (General Surgery) Gery Pray, MD (Radiation Oncology) Rigoberto Noel, MD (Pulmonary Disease) Heath Lark, MD as Consulting Physician (Hematology and Oncology)  SUMMARY OF ONCOLOGIC HISTORY:  This patient was diagnosed with [id number] (stage IIIA) invasive ductal carcinoma measuring 2.4 cm, grade 2 out of 3, status post right mastectomy with axillary lymph node dissection on April 20, 2010, with negative surgical margins; 6 out of 17 lymph nodes positive, with extracapsular extension. There was lymphovascular invasion. ER 81%, PR 64%, Ki-67 of 16%, HER-2/neu by CISH was negative ratio of 1.46. She had subcarinal adenopathy positive on PET scan in 03/2010; however, EBUS FNA was negative. Follow up CT chest in 07/2010 showed stable calcified subcarinal node. She has been followed by Dr. Elsworth Soho. She received adjuvant chemotherapy with dose dense AC followed by weekly Taxol finished on 09/21/2010. She terminated Taxol prematurely due to neuropathy, fatigue.  She had been on Arimidex since May of 2012. In May of 2015, she has unexplained weight loss and was subsequently found to have Graves' disease. She received radioactive iodine treatment.  INTERVAL HISTORY: Please see below for problem oriented charting. She is seen today because of significant fatigue, recent weight loss and persistent cough, non-productive. She felt unwell and her husband felt that she could sleep 24 hours if allowed to. She was able to carry on with most activities of daily living. She complained of feeling cold. Denies fevers or chills. The patient denies any recent signs or symptoms of bleeding such as spontaneous epistaxis, hematuria or hematochezia. She denies any recent abnormal breast examination, palpable mass, abnormal breast appearance or nipple changes  Her recent blood  work from primary care physician's office showed normal TSH, normal kidney function, hemoglobin of 10.9  REVIEW OF SYSTEMS:   Constitutional: Denies fevers, chills  Eyes: Denies blurriness of vision Ears, nose, mouth, throat, and face: Denies mucositis or sore throat Cardiovascular: Denies palpitation, chest discomfort or lower extremity swelling Gastrointestinal:  Denies nausea, heartburn or change in bowel habits Skin: Denies abnormal skin rashes Lymphatics: Denies new lymphadenopathy or easy bruising Neurological:Denies numbness, tingling or new weaknesses Behavioral/Psych: Mood is stable, no new changes  All other systems were reviewed with the patient and are negative.  I have reviewed the past medical history, past surgical history, social history and family history with the patient and they are unchanged from previous note.  ALLERGIES:  is allergic to pneumococcal vaccines and shellfish-derived products.  MEDICATIONS:  Current Outpatient Prescriptions  Medication Sig Dispense Refill  . alendronate (FOSAMAX) 35 MG tablet TAKE 1 TABLET EVERY WEEK 12 tablet 3  . anastrozole (ARIMIDEX) 1 MG tablet TAKE 1 TABLET EVERY DAY 90 tablet 3  . aspirin 81 MG tablet Take 81 mg by mouth daily.    . Calcium Carbonate (CALCIUM 600 PO) Take 2 capsules by mouth daily.     . Cholecalciferol (VITAMIN D PO) Take 1,000 Units by mouth daily.     Marland Kitchen levothyroxine (SYNTHROID, LEVOTHROID) 75 MCG tablet Take 75 mcg by mouth daily.    Marland Kitchen lisinopril (PRINIVIL,ZESTRIL) 10 MG tablet TAKE 1 TABLET EVERY DAY 90 tablet 3  . citalopram (CELEXA) 20 MG tablet Reported on 09/21/2015     No current facility-administered medications for this visit.    PHYSICAL EXAMINATION: ECOG PERFORMANCE STATUS: 1 - Symptomatic but completely ambulatory  Filed Vitals:   09/21/15 1122  BP:  119/59  Pulse: 93  Temp: 98.4 F (36.9 C)  Resp: 18   Filed Weights   09/21/15 1122  Weight: 108 lb 9.6 oz (49.261 kg)     GENERAL:alert, no distress and comfortable. She looks thin SKIN: skin color, texture, turgor are normal, no rashes or significant lesions EYES: normal, Conjunctiva are pink and non-injected, sclera clear OROPHARYNX:no exudate, no erythema and lips, buccal mucosa, and tongue normal  NECK: supple, thyroid normal size, non-tender, without nodularity LYMPH:  no palpable lymphadenopathy in the cervical, axillary or inguinal LUNGS: clear to auscultation and percussion with normal breathing effort HEART: regular rate & rhythm and no murmurs and no lower extremity edema ABDOMEN:abdomen soft, non-tender and normal bowel sounds Musculoskeletal:no cyanosis of digits and no clubbing  NEURO: alert & oriented x 3 with fluent speech, no focal motor/sensory deficits Chest wall examination reveals a right mastectomy scar with radiation changes on the chest wall. No other abnormalities. Normal breast exam on the left LABORATORY DATA:  I have reviewed the data as listed    Component Value Date/Time   NA 141 11/22/2014 1005   NA 136 03/22/2014 1227   K 4.3 11/22/2014 1005   K 4.8 03/22/2014 1227   CL 99 03/22/2014 1227   CL 104 12/24/2012 0912   CO2 28 11/22/2014 1005   CO2 31 03/22/2014 1227   GLUCOSE 74 11/22/2014 1005   GLUCOSE 69* 03/22/2014 1227   GLUCOSE 87 12/24/2012 0912   BUN 22.7 11/22/2014 1005   BUN 18 03/22/2014 1227   CREATININE 0.9 11/22/2014 1005   CREATININE 0.89 03/22/2014 1227   CALCIUM 10.4 11/22/2014 1005   CALCIUM 9.7 03/22/2014 1227   PROT 7.4 11/22/2014 1005   PROT 6.7 03/22/2014 1227   ALBUMIN 3.9 11/22/2014 1005   ALBUMIN 4.0 03/22/2014 1227   AST 19 11/22/2014 1005   AST 18 03/22/2014 1227   ALT 8 11/22/2014 1005   ALT <8 03/22/2014 1227   ALKPHOS 47 11/22/2014 1005   ALKPHOS 48 03/22/2014 1227   BILITOT 0.48 11/22/2014 1005   BILITOT 0.4 03/22/2014 1227   GFRNONAA 86* 09/03/2011 1050   GFRAA >90 09/03/2011 1050    No results found for: SPEP, UPEP  Lab  Results  Component Value Date   WBC 4.0 09/21/2015   NEUTROABS 2.8 09/21/2015   HGB 11.0* 09/21/2015   HCT 34.4* 09/21/2015   MCV 92.7 09/21/2015   PLT 210 09/21/2015      Chemistry      Component Value Date/Time   NA 141 11/22/2014 1005   NA 136 03/22/2014 1227   K 4.3 11/22/2014 1005   K 4.8 03/22/2014 1227   CL 99 03/22/2014 1227   CL 104 12/24/2012 0912   CO2 28 11/22/2014 1005   CO2 31 03/22/2014 1227   BUN 22.7 11/22/2014 1005   BUN 18 03/22/2014 1227   CREATININE 0.9 11/22/2014 1005   CREATININE 0.89 03/22/2014 1227      Component Value Date/Time   CALCIUM 10.4 11/22/2014 1005   CALCIUM 9.7 03/22/2014 1227   ALKPHOS 47 11/22/2014 1005   ALKPHOS 48 03/22/2014 1227   AST 19 11/22/2014 1005   AST 18 03/22/2014 1227   ALT 8 11/22/2014 1005   ALT <8 03/22/2014 1227   BILITOT 0.48 11/22/2014 1005   BILITOT 0.4 03/22/2014 1227       ASSESSMENT & PLAN:  Breast cancer of upper-outer quadrant of right female breast ([place]) Clinically, she has no evidence of recurrence. She will  continue her Arimidex treatment until May of 2017. Recent screening mammogram of left was negative   Anemia in chronic illness This is likely anemia of chronic disease. The patient denies recent history of bleeding such as epistaxis, hematuria or hematochezia.  This should not be the cause of her fatigue.   Lung nodule seen on imaging study  She had lung nodule seen on previous CT. She mild weight loss and recent cough. I recommend CT evaluation to exclude metastatic cancer to the lung and she agreed to proceed  Cough, persistent  Her examination is benign. She have some persistent cough and in view of history of lung nodules and breast cancer, I will order CT for evaluation and she agreed  Chronic fatigue  The cause is unknown. Her recent TSH was within normal limits. She is mildly anemic but not severe enough to cause severe fatigue. The patient have history of COPD. I will order  a CT scan for evaluation for reasons above and if that comes back negative, I will review for her back to see pulmonologist for further evaluation   Orders Placed This Encounter  Procedures  . CT Chest Wo Contrast    Standing Status: Future     Number of Occurrences:      Standing Expiration Date: 11/20/2016    Order Specific Question:  Reason for Exam (SYMPTOM  OR DIAGNOSIS REQUIRED)    Answer:  cough, weight loss, breast ca, exclude cancer to lung    Order Specific Question:  Preferred imaging location?    Answer:  [facility]   All questions were answered. The patient knows to call the clinic with any problems, questions or concerns. No barriers to learning was detected. I spent 20 minutes counseling the patient face to face. The total time spent in the appointment was 25 minutes and more than 50% was on counseling and review of test results     [facility], [place], MD 09/21/2015 12:07 PM

## 2015-09-21 NOTE — Assessment & Plan Note (Signed)
The cause is unknown. Her recent TSH was within normal limits. She is mildly anemic but not severe enough to cause severe fatigue. The patient have history of COPD. I will order a CT scan for evaluation for reasons above and if that comes back negative, I will review for her back to see pulmonologist for further evaluation

## 2015-09-21 NOTE — Assessment & Plan Note (Signed)
Her examination is benign. She have some persistent cough and in view of history of lung nodules and breast cancer, I will order CT for evaluation and she agreed

## 2015-09-21 NOTE — Assessment & Plan Note (Signed)
This is likely anemia of chronic disease. The patient denies recent history of bleeding such as epistaxis, hematuria or hematochezia.  This should not be the cause of her fatigue.

## 2015-09-21 NOTE — Assessment & Plan Note (Signed)
Clinically, she has no evidence of recurrence. She will continue her Arimidex treatment until May of 2017. Recent screening mammogram of left was negative

## 2015-09-21 NOTE — Telephone Encounter (Signed)
Pt confirmed MD visit per 02/08 POF, gave pt AVS and Calendar... KJ

## 2015-09-21 NOTE — Assessment & Plan Note (Signed)
She had lung nodule seen on previous CT. She mild weight loss and recent cough. I recommend CT evaluation to exclude metastatic cancer to the lung and she agreed to proceed

## 2015-09-22 LAB — SEDIMENTATION RATE: Sedimentation Rate-Westergren: 32 mm/hr (ref 0–40)

## 2015-09-26 ENCOUNTER — Other Ambulatory Visit: Payer: Self-pay | Admitting: Hematology and Oncology

## 2015-09-28 ENCOUNTER — Encounter (HOSPITAL_COMMUNITY): Payer: Self-pay

## 2015-09-28 ENCOUNTER — Ambulatory Visit (HOSPITAL_COMMUNITY)
Admission: RE | Admit: 2015-09-28 | Discharge: 2015-09-28 | Disposition: A | Discharge status: Home / Self Care | Payer: Medicare Other | Source: Ambulatory Visit | Attending: Hematology and Oncology | Admitting: Hematology and Oncology

## 2015-09-28 DIAGNOSIS — R918 Other nonspecific abnormal finding of lung field: Secondary | ICD-10-CM | POA: Diagnosis not present

## 2015-09-28 DIAGNOSIS — R634 Abnormal weight loss: Secondary | ICD-10-CM | POA: Diagnosis not present

## 2015-09-28 DIAGNOSIS — Z853 Personal history of malignant neoplasm of breast: Secondary | ICD-10-CM | POA: Diagnosis not present

## 2015-09-28 DIAGNOSIS — M419 Scoliosis, unspecified: Secondary | ICD-10-CM | POA: Insufficient documentation

## 2015-09-28 DIAGNOSIS — C50911 Malignant neoplasm of unspecified site of right female breast: Secondary | ICD-10-CM | POA: Diagnosis not present

## 2015-09-28 DIAGNOSIS — J439 Emphysema, unspecified: Secondary | ICD-10-CM | POA: Insufficient documentation

## 2015-09-28 DIAGNOSIS — R053 Chronic cough: Secondary | ICD-10-CM

## 2015-09-28 DIAGNOSIS — R05 Cough: Secondary | ICD-10-CM | POA: Insufficient documentation

## 2015-09-28 DIAGNOSIS — R911 Solitary pulmonary nodule: Secondary | ICD-10-CM | POA: Diagnosis present

## 2015-09-29 ENCOUNTER — Encounter: Payer: Self-pay | Admitting: Hematology and Oncology

## 2015-09-29 ENCOUNTER — Ambulatory Visit (HOSPITAL_BASED_OUTPATIENT_CLINIC_OR_DEPARTMENT_OTHER): Payer: Medicare Other | Admitting: Hematology and Oncology

## 2015-09-29 ENCOUNTER — Telehealth: Payer: Self-pay | Admitting: Hematology and Oncology

## 2015-09-29 ENCOUNTER — Telehealth: Payer: Self-pay | Admitting: Pulmonary Disease

## 2015-09-29 VITALS — BP 155/59 | HR 83 | Temp 97.8°F | Resp 18 | Ht 63.0 in | Wt 108.8 lb

## 2015-09-29 DIAGNOSIS — C50411 Malignant neoplasm of upper-outer quadrant of right female breast: Secondary | ICD-10-CM | POA: Diagnosis not present

## 2015-09-29 DIAGNOSIS — Z170 Estrogen receptor positive status [ER+]: Secondary | ICD-10-CM | POA: Diagnosis not present

## 2015-09-29 DIAGNOSIS — J439 Emphysema, unspecified: Secondary | ICD-10-CM | POA: Diagnosis not present

## 2015-09-29 DIAGNOSIS — C773 Secondary and unspecified malignant neoplasm of axilla and upper limb lymph nodes: Secondary | ICD-10-CM | POA: Diagnosis not present

## 2015-09-29 DIAGNOSIS — J438 Other emphysema: Secondary | ICD-10-CM

## 2015-09-29 DIAGNOSIS — D638 Anemia in other chronic diseases classified elsewhere: Secondary | ICD-10-CM

## 2015-09-29 NOTE — Assessment & Plan Note (Signed)
This is likely anemia of chronic disease. The patient denies recent history of bleeding such as epistaxis, hematuria or hematochezia.  This should not be the cause of her excessive fatigue.

## 2015-09-29 NOTE — Telephone Encounter (Signed)
Pt confirmed labs/ov per 02/16 POF, gave pt AVS and Calendar.... KJ °

## 2015-09-29 NOTE — Assessment & Plan Note (Signed)
Blood work and imaging studies show no evidence of metastatic cancer. The patient's desire to stop adjuvant endocrine therapy earlier than anticipated in May 2017. I think it is reasonable

## 2015-09-29 NOTE — Assessment & Plan Note (Signed)
She has excessive fatigue. She has not been using her inhalers as prescribed. I suspect may be the cause of her excessive fatigue could be due to COPD and chronic shortness of breath I recommend she schedule another appointment to see her pulmonologist

## 2015-09-29 NOTE — Progress Notes (Signed)
[facility] OFFICE PROGRESS NOTE  Patient Care Team: Kaleen Mask, MD as PCP - General (Family Medicine) Emelia Loron, MD (General Surgery) Antony Blackbird, MD (Radiation Oncology) Oretha Milch, MD (Pulmonary Disease) Artis Delay, MD as Consulting Physician (Hematology and Oncology)  SUMMARY OF ONCOLOGIC HISTORY:  This patient was diagnosed with pT2pN2aMX (stage IIIA) invasive ductal carcinoma measuring 2.4 cm, grade 2 out of 3, status post right mastectomy with axillary lymph node dissection on April 20, 2010, with negative surgical margins; 6 out of 17 lymph nodes positive, with extracapsular extension. There was lymphovascular invasion. ER 81%, PR 64%, Ki-67 of 16%, HER-2/neu by CISH was negative ratio of 1.46. She had subcarinal adenopathy positive on PET scan in 03/2010; however, EBUS FNA was negative. Follow up CT chest in 07/2010 showed stable calcified subcarinal node. She has been followed by Dr. Vassie Loll. She received adjuvant chemotherapy with dose dense AC followed by weekly Taxol finished on 09/21/2010. She terminated Taxol prematurely due to neuropathy, fatigue.  She had been on Arimidex since May of 2012. In May of 2015, she has unexplained weight loss and was subsequently found to have Graves' disease. She received radioactive iodine treatment. CT scan of the chest from 09/27/2015 show emphysematous changes and stable lung nodules  INTERVAL HISTORY: Please see below for problem oriented charting. She continues to complain of fatigue  REVIEW OF SYSTEMS:   Constitutional: Denies fevers, chills or abnormal weight loss Eyes: Denies blurriness of vision Ears, nose, mouth, throat, and face: Denies mucositis or sore throat Respiratory: Denies cough, dyspnea or wheezes Cardiovascular: Denies palpitation, chest discomfort or lower extremity swelling Gastrointestinal:  Denies nausea, heartburn or change in bowel habits Skin: Denies abnormal skin  rashes Lymphatics: Denies new lymphadenopathy or easy bruising Neurological:Denies numbness, tingling or new weaknesses Behavioral/Psych: Mood is stable, no new changes  All other systems were reviewed with the patient and are negative.  I have reviewed the past medical history, past surgical history, social history and family history with the patient and they are unchanged from previous note.  ALLERGIES:  is allergic to pneumococcal vaccines and shellfish-derived products.  MEDICATIONS:  Current Outpatient Prescriptions  Medication Sig Dispense Refill  . alendronate (FOSAMAX) 35 MG tablet TAKE 1 TABLET EVERY WEEK 12 tablet 3  . anastrozole (ARIMIDEX) 1 MG tablet TAKE 1 TABLET EVERY DAY 90 tablet 3  . aspirin 81 MG tablet Take 81 mg by mouth daily.    . Calcium Carbonate (CALCIUM 600 PO) Take 2 capsules by mouth daily.     . Cholecalciferol (VITAMIN D PO) Take 1,000 Units by mouth daily.     . citalopram (CELEXA) 20 MG tablet Reported on 09/21/2015    . levothyroxine (SYNTHROID, LEVOTHROID) 75 MCG tablet Take 75 mcg by mouth daily.    Marland Kitchen lisinopril (PRINIVIL,ZESTRIL) 10 MG tablet TAKE 1 TABLET EVERY DAY 90 tablet 3   No current facility-administered medications for this visit.    PHYSICAL EXAMINATION: ECOG PERFORMANCE STATUS: 0 - Asymptomatic  Filed Vitals:   09/29/15 0924  BP: 155/59  Pulse: 83  Temp: 97.8 F (36.6 C)  Resp: 18   Filed Weights   09/29/15 0924  Weight: 108 lb 12.8 oz (49.351 kg)    GENERAL:alert, no distress and comfortable SKIN: skin color, texture, turgor are normal, no rashes or significant lesions EYES: normal, Conjunctiva are pink and non-injected, sclera clear Musculoskeletal:no cyanosis of digits and no clubbing  NEURO: alert & oriented x 3 with fluent speech, no focal  motor/sensory deficits  LABORATORY DATA:  I have reviewed the data as listed    Component Value Date/Time   NA 141 11/22/2014 1005   NA 136 03/22/2014 1227   K 4.3 11/22/2014  1005   K 4.8 03/22/2014 1227   CL 99 03/22/2014 1227   CL 104 12/24/2012 0912   CO2 28 11/22/2014 1005   CO2 31 03/22/2014 1227   GLUCOSE 74 11/22/2014 1005   GLUCOSE 69* 03/22/2014 1227   GLUCOSE 87 12/24/2012 0912   BUN 22.7 11/22/2014 1005   BUN 18 03/22/2014 1227   CREATININE 0.9 11/22/2014 1005   CREATININE 0.89 03/22/2014 1227   CALCIUM 10.4 11/22/2014 1005   CALCIUM 9.7 03/22/2014 1227   PROT 7.4 11/22/2014 1005   PROT 6.7 03/22/2014 1227   ALBUMIN 3.9 11/22/2014 1005   ALBUMIN 4.0 03/22/2014 1227   AST 19 11/22/2014 1005   AST 18 03/22/2014 1227   ALT 8 11/22/2014 1005   ALT <8 03/22/2014 1227   ALKPHOS 47 11/22/2014 1005   ALKPHOS 48 03/22/2014 1227   BILITOT 0.48 11/22/2014 1005   BILITOT 0.4 03/22/2014 1227   GFRNONAA 86* 09/03/2011 1050   GFRAA >90 09/03/2011 1050    No results found for: SPEP, UPEP  Lab Results  Component Value Date   WBC 4.0 09/21/2015   NEUTROABS 2.8 09/21/2015   HGB 11.0* 09/21/2015   HCT 34.4* 09/21/2015   MCV 92.7 09/21/2015   PLT 210 09/21/2015      Chemistry      Component Value Date/Time   NA 141 11/22/2014 1005   NA 136 03/22/2014 1227   K 4.3 11/22/2014 1005   K 4.8 03/22/2014 1227   CL 99 03/22/2014 1227   CL 104 12/24/2012 0912   CO2 28 11/22/2014 1005   CO2 31 03/22/2014 1227   BUN 22.7 11/22/2014 1005   BUN 18 03/22/2014 1227   CREATININE 0.9 11/22/2014 1005   CREATININE 0.89 03/22/2014 1227      Component Value Date/Time   CALCIUM 10.4 11/22/2014 1005   CALCIUM 9.7 03/22/2014 1227   ALKPHOS 47 11/22/2014 1005   ALKPHOS 48 03/22/2014 1227   AST 19 11/22/2014 1005   AST 18 03/22/2014 1227   ALT 8 11/22/2014 1005   ALT <8 03/22/2014 1227   BILITOT 0.48 11/22/2014 1005   BILITOT 0.4 03/22/2014 1227       RADIOGRAPHIC STUDIES: I have personally reviewed the radiological images as listed and agreed with the findings in the report. Ct Chest Wo Contrast  09/28/2015  CLINICAL DATA:  Right breast cancer.  Chemotherapy and radiation therapy completed. Persisting cough and weight loss. EXAM: CT CHEST WITHOUT CONTRAST TECHNIQUE: Multidetector CT imaging of the chest was performed following the standard protocol without IV contrast. COMPARISON:  09/10/2012 FINDINGS: Mediastinum/Nodes: Coronary, aortic arch, and branch vessel atherosclerotic vascular disease. Heart size within normal limits. Moderate-sized hiatal hernia containing stomach. Old granulomatous disease noted with calcified subcarinal and hilar lymph nodes. Lungs/Pleura: Punctate calcifications along a 3.2 by 3.2 cm right apical pleural parenchymal density. Similar size previously although the faint calcifications appear increased. No rib destruction or obvious chest wall invasion to suggest Pancoast tumor. There is some associated volume loss. Emphysema. Scarring in the right middle lobe similar to prior. Mild scarring at the left lung apex. 2-3 mm subpleural nodule in the left upper lobe, image 25 series 6, no change from prior. 3 mm left upper lobe nodule on image 26 series 6, stable. 3 mm  lingular nodule, image 34 series 6, stable. Pleural thickening anteriorly along the right middle lobe, compatible with prior radiation therapy. Unchanged. Upper abdomen: Old granulomatous disease of the spleen. Calcifications of the splenic artery and abdominal aorta. Musculoskeletal: Chronic enchondroma (less likely bone infarct) in the right proximal humerus. There is 48 degrees of levoconvex scoliosis as measured between T12 and L3. Mild subluxations at T12-L1, L1- 2, and L2-3 with possible foraminal impingement in this vicinity. IMPRESSION: 1. I do not observe recurrent breast cancer in the thorax. Biapical per pleural-parenchymal scarring noted, right greater than left, with with the calcifications along the scarring along with scattered calcified thoracic lymph nodes favoring old granulomatous disease. 2. Coronary, aortic arch, and branch vessel atherosclerotic  vascular disease. 3. Stable small nodules in the left lung, no change from 2014, benign. 4. Post therapy related findings anteriorly along the right middle lobe. 5. 48 degrees of levoconvex lumbar scoliosis at the thoracolumbar junction, with degenerative subluxations at T12-L1 and in the upper lumbar spine potentially contributing to foraminal impingement. 6. Emphysema. Electronically Signed   By: Van Clines M.D.   On: 09/28/2015 15:24     ASSESSMENT & PLAN:  Breast cancer of upper-outer quadrant of right female breast ([place]) Blood work and imaging studies show no evidence of metastatic cancer. The patient's desire to stop adjuvant endocrine therapy earlier than anticipated in May 2017. I think it is reasonable  Anemia in chronic illness This is likely anemia of chronic disease. The patient denies recent history of bleeding such as epistaxis, hematuria or hematochezia.  This should not be the cause of her excessive fatigue.     Emphysema of lung ([place]) She has excessive fatigue. She has not been using her inhalers as prescribed. I suspect may be the cause of her excessive fatigue could be due to COPD and chronic shortness of breath I recommend she schedule another appointment to see her pulmonologist   Orders Placed This Encounter  Procedures  . CBC with Differential/Platelet    Standing Status: Future     Number of Occurrences:      Standing Expiration Date: 11/02/2016   All questions were answered. The patient knows to call the clinic with any problems, questions or concerns. No barriers to learning was detected. I spent 15 minutes counseling the patient face to face. The total time spent in the appointment was 20 minutes and more than 50% was on counseling and review of test results     [facility], [place], MD 09/29/2015 9:57 AM

## 2015-09-29 NOTE — Telephone Encounter (Signed)
Spoke with pt's husband. Pt is needing an appointment with RA. She had a recent CT scan and was told to follow up with RA due to the findings. Pt's husband states this is not an urgent matter. Pt has been scheduled to see RA on 11/22/15 at 2:15pm. Pt was last seen in 2015, not 2013.

## 2015-11-13 ENCOUNTER — Other Ambulatory Visit: Payer: Self-pay | Admitting: Cardiovascular Disease

## 2015-11-21 ENCOUNTER — Ambulatory Visit: Payer: Medicare Other | Admitting: Hematology and Oncology

## 2015-11-21 ENCOUNTER — Other Ambulatory Visit: Payer: Medicare Other

## 2015-11-22 ENCOUNTER — Ambulatory Visit (INDEPENDENT_AMBULATORY_CARE_PROVIDER_SITE_OTHER): Payer: Medicare Other | Admitting: Pulmonary Disease

## 2015-11-22 ENCOUNTER — Encounter: Payer: Self-pay | Admitting: Pulmonary Disease

## 2015-11-22 VITALS — BP 118/62 | HR 82 | Ht 64.0 in | Wt 109.0 lb

## 2015-11-22 DIAGNOSIS — R911 Solitary pulmonary nodule: Secondary | ICD-10-CM | POA: Diagnosis not present

## 2015-11-22 NOTE — Assessment & Plan Note (Signed)
CT chest in feb 2018

## 2015-11-22 NOTE — Assessment & Plan Note (Signed)
Stable , no meds required Spirometry next visit

## 2015-11-22 NOTE — Patient Instructions (Signed)
CT chest in feb 2018 Start ZYRTEC daily x 6 weeks Consider stopping lisinopril

## 2015-11-22 NOTE — Progress Notes (Signed)
   Subjective:    Patient ID: Rachel Mcbride, female    DOB: 1946/06/20, 70 y.o.   MRN: [id number]  HPI  PCP - Arelia Sneddon   70/F , ex- heavy smoker > 50 Pyrs, for FU of COPD & subcarinal lymphadenopathy.  Breast cancer treated in 2011 07/2010 Spirometry FEV1 at 55%, ratio at 74.  02/2011 quit smoking     11/22/2015  Chief Complaint  Patient presents with  . Follow-up    breathing is average.  CT scan found abnormalities.  Allergies:  Hives, syncope episodes. Tingling in feet (thinks it is from Chemo)   2 y FU  C/o hives - thought it was related to shrimp But has happened even without - stopped celexa  Breathing stable ,still uses the electronic cig. Denies dyspnea , more limited by pain in legs - ' neuropathy ' post chemo 12/2013 FEV1 49%  09/2015 CT chest reviewed - biapical scarring - mass like with calcifications, stable & nodules on left, stable scarring RML from RT  Significant tests/ events   She presented with a breast mass in 8/11, diagnosed asT2 N1 Mx lobular breast ca with right axillary LN involvement. PET-CT showed negative right cervical Ln, negative 5 mm & 10 mm nodules in the lung & 9x 14 mm subcarinal Ln with malignant range uptake although this showed a hint of calcification on the CT images - EBUS neg.  Underwent MRM, LNs pos -->ER , PR pos, Her-2 neu neg   Admitted 12/2011for RML PNA. During her stay she developed a-flutter with cardilogy consult ([place]). Echo showed EF 45-50%, mild LV reduction , BNP was elevated at 755.   New RUL nodule 02/2011 -resolved in 08/2012.    Review of Systems Patient denies significant dyspnea,cough, hemoptysis,  chest pain, palpitations, pedal edema, orthopnea, paroxysmal nocturnal dyspnea, lightheadedness, nausea, vomiting, abdominal or  leg pains      Objective:   Physical Exam  Gen. Pleasant, thin, in no distress ENT - no lesions, no post nasal drip Neck: No JVD, no thyromegaly, no carotid bruits Lungs: no use of accessory  muscles, no dullness to percussion, clear without rales or rhonchi  Cardiovascular: Rhythm regular, heart sounds  normal, no murmurs or gallops, no peripheral edema Musculoskeletal: No deformities, no cyanosis or clubbing        Assessment & Plan:

## 2015-11-24 ENCOUNTER — Telehealth: Payer: Self-pay | Admitting: Pulmonary Disease

## 2015-11-24 NOTE — Telephone Encounter (Signed)
Explained that Cetirizine and Zyrtec 10mg  are the same medication. Nothing further needed.

## 2016-03-21 ENCOUNTER — Other Ambulatory Visit: Payer: Self-pay | Admitting: Family Medicine

## 2016-03-21 DIAGNOSIS — Z1231 Encounter for screening mammogram for malignant neoplasm of breast: Secondary | ICD-10-CM

## 2016-04-05 ENCOUNTER — Other Ambulatory Visit (HOSPITAL_BASED_OUTPATIENT_CLINIC_OR_DEPARTMENT_OTHER): Payer: Medicare Other

## 2016-04-05 ENCOUNTER — Ambulatory Visit (HOSPITAL_BASED_OUTPATIENT_CLINIC_OR_DEPARTMENT_OTHER): Payer: Medicare Other | Admitting: Hematology and Oncology

## 2016-04-05 ENCOUNTER — Encounter: Payer: Self-pay | Admitting: Hematology and Oncology

## 2016-04-05 DIAGNOSIS — G8929 Other chronic pain: Secondary | ICD-10-CM | POA: Diagnosis not present

## 2016-04-05 DIAGNOSIS — Z853 Personal history of malignant neoplasm of breast: Secondary | ICD-10-CM

## 2016-04-05 DIAGNOSIS — D638 Anemia in other chronic diseases classified elsewhere: Secondary | ICD-10-CM

## 2016-04-05 DIAGNOSIS — R911 Solitary pulmonary nodule: Secondary | ICD-10-CM | POA: Diagnosis not present

## 2016-04-05 DIAGNOSIS — M542 Cervicalgia: Secondary | ICD-10-CM | POA: Diagnosis not present

## 2016-04-05 DIAGNOSIS — R5382 Chronic fatigue, unspecified: Secondary | ICD-10-CM | POA: Diagnosis not present

## 2016-04-05 LAB — CBC WITH DIFFERENTIAL/PLATELET
BASO%: 1.2 % (ref 0.0–2.0)
Basophils Absolute: 0.1 10e3/uL (ref 0.0–0.1)
EOS%: 5.9 % (ref 0.0–7.0)
Eosinophils Absolute: 0.2 10e3/uL (ref 0.0–0.5)
HCT: 35.4 % (ref 34.8–46.6)
HGB: 11.3 g/dL — ABNORMAL LOW (ref 11.6–15.9)
LYMPH%: 15.6 % (ref 14.0–49.7)
MCH: 29.3 pg (ref 25.1–34.0)
MCHC: 31.9 g/dL (ref 31.5–36.0)
MCV: 91.7 fL (ref 79.5–101.0)
MONO#: 0.4 10e3/uL (ref 0.1–0.9)
MONO%: 9.9 % (ref 0.0–14.0)
NEUT#: 2.7 10e3/uL (ref 1.5–6.5)
NEUT%: 67.4 % (ref 38.4–76.8)
Platelets: 205 10e3/uL (ref 145–400)
RBC: 3.86 10e6/uL (ref 3.70–5.45)
RDW: 13.4 % (ref 11.2–14.5)
WBC: 4 10e3/uL (ref 3.9–10.3)
lymph#: 0.6 10e3/uL — ABNORMAL LOW (ref 0.9–3.3)

## 2016-04-05 NOTE — Assessment & Plan Note (Signed)
She had lung nodule seen on previous CT. She will continue close follow-up with pulmonologist with serial imaging studies

## 2016-04-05 NOTE — Assessment & Plan Note (Signed)
She has chronic neck pain radiating down to her right side of the neck. Examination is benign. The patient has scoliosis with osteoporosis and is currently taking calcium and vitamin D supplement. I recommend consideration for physical therapy but the patient declined

## 2016-04-05 NOTE — Assessment & Plan Note (Signed)
This is likely anemia of chronic disease. The patient denies recent history of bleeding such as epistaxis, hematuria or hematochezia.  This should not be the cause of her excessive fatigue.

## 2016-04-05 NOTE — Assessment & Plan Note (Signed)
Blood work and recent imaging studies show no evidence of metastatic cancer. She has completed adjuvant treatment. Examination today is benign She is more than 5 years out and is consider long-term cancer survivor. I recommend close follow-up with primary care doctor in the future with annual mammogram

## 2016-04-05 NOTE — Progress Notes (Signed)
[facility] OFFICE PROGRESS NOTE  Patient Care Team: Leonard Downing, MD as PCP - General (Family Medicine) Rolm Bookbinder, MD (General Surgery) Gery Pray, MD (Radiation Oncology) Rigoberto Noel, MD (Pulmonary Disease) Heath Lark, MD as Consulting Physician (Hematology and Oncology)  SUMMARY OF ONCOLOGIC HISTORY:  This patient was diagnosed with pT2pN2aMX (stage IIIA) invasive ductal carcinoma measuring 2.4 cm, grade 2 out of 3, status post right mastectomy with axillary lymph node dissection on April 20, 2010, with negative surgical margins; 6 out of 17 lymph nodes positive, with extracapsular extension. There was lymphovascular invasion. ER 81%, PR 64%, Ki-67 of 16%, HER-2/neu by CISH was negative ratio of 1.46. She had subcarinal adenopathy positive on PET scan in 03/2010; however, EBUS FNA was negative. Follow up CT chest in 07/2010 showed stable calcified subcarinal node. She has been followed by Dr. Elsworth Soho. She received adjuvant chemotherapy with dose dense AC followed by weekly Taxol finished on 09/21/2010. She terminated Taxol prematurely due to neuropathy, fatigue.  She had been on Arimidex since May of 2012. In May of 2015, she has unexplained weight loss and was subsequently found to have Graves' disease. She received radioactive iodine treatment. CT scan of the chest from 09/27/2015 show emphysematous changes and stable lung nodules  INTERVAL HISTORY: Please see below for problem oriented charting. She continues to complain of fatigue The patient also have major stress related to family dynamics. She has poor sleep hygeine.  She complained of some blurriness of vision but has not seen her ophthalmologist recently. Her appetite is stable. Denies recent weight loss. She denies any recent abnormal breast examination, palpable mass, abnormal breast appearance or nipple changes  REVIEW OF SYSTEMS:   Constitutional: Denies fevers, chills or abnormal weight  loss Ears, nose, mouth, throat, and face: Denies mucositis or sore throat Respiratory: Denies cough, dyspnea or wheezes Cardiovascular: Denies palpitation, chest discomfort or lower extremity swelling Gastrointestinal:  Denies nausea, heartburn or change in bowel habits Skin: Denies abnormal skin rashes Lymphatics: Denies new lymphadenopathy or easy bruising Neurological:Denies numbness, tingling or new weaknesses Behavioral/Psych: Mood is stable, no new changes  All other systems were reviewed with the patient and are negative.  I have reviewed the past medical history, past surgical history, social history and family history with the patient and they are unchanged from previous note.  ALLERGIES:  is allergic to pneumococcal vaccines and shellfish-derived products.  MEDICATIONS:  Current Outpatient Prescriptions  Medication Sig Dispense Refill  . alendronate (FOSAMAX) 35 MG tablet TAKE 1 TABLET EVERY WEEK 12 tablet 3  . aspirin 81 MG tablet Take 81 mg by mouth daily.    . Calcium Carbonate (CALCIUM 600 PO) Take 2 capsules by mouth daily.     . Cholecalciferol (VITAMIN D PO) Take 2,000 Units by mouth daily.     Marland Kitchen levothyroxine (SYNTHROID, LEVOTHROID) 75 MCG tablet Take 75 mcg by mouth daily.    Marland Kitchen ofloxacin (OCUFLOX) 0.3 % ophthalmic solution      No current facility-administered medications for this visit.     PHYSICAL EXAMINATION: ECOG PERFORMANCE STATUS: 1 - Symptomatic but completely ambulatory  Vitals:   04/05/16 0958  BP: (!) 136/54  Pulse: 78  Resp: 18  Temp: 97.6 F (36.4 C)   Filed Weights   04/05/16 0958  Weight: 112 lb 8 oz (51 kg)    GENERAL:alert, no distress and comfortable SKIN: skin color, texture, turgor are normal, no rashes or significant lesions EYES: normal, Conjunctiva are pink and non-injected,  sclera clear OROPHARYNX:no exudate, no erythema and lips, buccal mucosa, and tongue normal  NECK: supple, thyroid normal size, non-tender, without  nodularity LYMPH:  no palpable lymphadenopathy in the cervical, axillary or inguinal LUNGS: clear to auscultation and percussion with normal breathing effort HEART: regular rate & rhythm and no murmurs and no lower extremity edema ABDOMEN:abdomen soft, non-tender and normal bowel sounds Musculoskeletal:no cyanosis of digits and no clubbing  NEURO: alert & oriented x 3 with fluent speech, no focal motor/sensory deficits Chest one examination revealed well-healed mastectomy scar on the right breast. Normal breast exam on the left LABORATORY DATA:  I have reviewed the data as listed    Component Value Date/Time   NA 141 11/22/2014 1005   K 4.3 11/22/2014 1005   CL 99 03/22/2014 1227   CL 104 12/24/2012 0912   CO2 28 11/22/2014 1005   GLUCOSE 74 11/22/2014 1005   GLUCOSE 87 12/24/2012 0912   BUN 22.7 11/22/2014 1005   CREATININE 0.9 11/22/2014 1005   CALCIUM 10.4 11/22/2014 1005   PROT 7.4 11/22/2014 1005   ALBUMIN 3.9 11/22/2014 1005   AST 19 11/22/2014 1005   ALT 8 11/22/2014 1005   ALKPHOS 47 11/22/2014 1005   BILITOT 0.48 11/22/2014 1005   GFRNONAA 86 (L) 09/03/2011 1050   GFRAA >90 09/03/2011 1050    No results found for: SPEP, UPEP  Lab Results  Component Value Date   WBC 4.0 04/05/2016   NEUTROABS 2.7 04/05/2016   HGB 11.3 (L) 04/05/2016   HCT 35.4 04/05/2016   MCV 91.7 04/05/2016   PLT 205 04/05/2016      Chemistry      Component Value Date/Time   NA 141 11/22/2014 1005   K 4.3 11/22/2014 1005   CL 99 03/22/2014 1227   CL 104 12/24/2012 0912   CO2 28 11/22/2014 1005   BUN 22.7 11/22/2014 1005   CREATININE 0.9 11/22/2014 1005      Component Value Date/Time   CALCIUM 10.4 11/22/2014 1005   ALKPHOS 47 11/22/2014 1005   AST 19 11/22/2014 1005   ALT 8 11/22/2014 1005   BILITOT 0.48 11/22/2014 1005      ASSESSMENT & PLAN:  Personal history of malignant neoplasm of breast Blood work and recent imaging studies show no evidence of metastatic cancer. She  has completed adjuvant treatment. Examination today is benign She is more than 5 years out and is consider long-term cancer survivor. I recommend close follow-up with primary care doctor in the future with annual mammogram   Anemia in chronic illness This is likely anemia of chronic disease. The patient denies recent history of bleeding such as epistaxis, hematuria or hematochezia.  This should not be the cause of her excessive fatigue.   Lung nodule seen on imaging study She had lung nodule seen on previous CT. She will continue close follow-up with pulmonologist with serial imaging studies  Chronic fatigue  The cause is unknown, but likely multifactorial related to stress and depression recently Her recent TSH was within normal limits. She is mildly anemic but not severe enough to cause severe fatigue. The patient have history of COPD. I recommend close follow-up with her endocrinologist for repeat thyroid function check and to consider antidepressant if needed. She was prescribed citalopram but has not started to take this  Chronic neck pain She has chronic neck pain radiating down to her right side of the neck. Examination is benign. The patient has scoliosis with osteoporosis and is currently taking calcium  and vitamin D supplement. I recommend consideration for physical therapy but the patient declined   No orders of the defined types were placed in this encounter.  All questions were answered. The patient knows to call the clinic with any problems, questions or concerns. No barriers to learning was detected. I spent 20 minutes counseling the patient face to face. The total time spent in the appointment was 25 minutes and more than 50% was on counseling and review of test results     [facility], Andreya Lacks, MD 04/05/2016 11:01 AM

## 2016-04-05 NOTE — Assessment & Plan Note (Signed)
The cause is unknown, but likely multifactorial related to stress and depression recently Her recent TSH was within normal limits. She is mildly anemic but not severe enough to cause severe fatigue. The patient have history of COPD. I recommend close follow-up with her endocrinologist for repeat thyroid function check and to consider antidepressant if needed. She was prescribed citalopram but has not started to take this

## 2016-05-01 ENCOUNTER — Ambulatory Visit
Admission: RE | Admit: 2016-05-01 | Discharge: 2016-05-01 | Disposition: A | Discharge status: Home / Self Care | Payer: Medicare Other | Source: Ambulatory Visit | Attending: Family Medicine | Admitting: Family Medicine

## 2016-05-01 DIAGNOSIS — Z1231 Encounter for screening mammogram for malignant neoplasm of breast: Secondary | ICD-10-CM

## 2016-05-28 DIAGNOSIS — H04123 Dry eye syndrome of bilateral lacrimal glands: Secondary | ICD-10-CM | POA: Diagnosis not present

## 2016-05-28 DIAGNOSIS — H524 Presbyopia: Secondary | ICD-10-CM | POA: Diagnosis not present

## 2016-05-31 DIAGNOSIS — Z23 Encounter for immunization: Secondary | ICD-10-CM | POA: Diagnosis not present

## 2016-08-27 DIAGNOSIS — D649 Anemia, unspecified: Secondary | ICD-10-CM | POA: Diagnosis not present

## 2016-08-27 DIAGNOSIS — D485 Neoplasm of uncertain behavior of skin: Secondary | ICD-10-CM | POA: Diagnosis not present

## 2016-08-27 DIAGNOSIS — Z049 Encounter for examination and observation for unspecified reason: Secondary | ICD-10-CM | POA: Diagnosis not present

## 2016-08-27 DIAGNOSIS — M899 Disorder of bone, unspecified: Secondary | ICD-10-CM | POA: Diagnosis not present

## 2016-08-27 DIAGNOSIS — R5381 Other malaise: Secondary | ICD-10-CM | POA: Diagnosis not present

## 2016-08-27 DIAGNOSIS — R918 Other nonspecific abnormal finding of lung field: Secondary | ICD-10-CM | POA: Diagnosis not present

## 2016-08-27 DIAGNOSIS — L659 Nonscarring hair loss, unspecified: Secondary | ICD-10-CM | POA: Diagnosis not present

## 2016-08-27 DIAGNOSIS — C50919 Malignant neoplasm of unspecified site of unspecified female breast: Secondary | ICD-10-CM | POA: Diagnosis not present

## 2016-08-27 DIAGNOSIS — Z0000 Encounter for general adult medical examination without abnormal findings: Secondary | ICD-10-CM | POA: Diagnosis not present

## 2016-08-28 ENCOUNTER — Other Ambulatory Visit: Payer: Self-pay | Admitting: Family Medicine

## 2016-08-28 DIAGNOSIS — E2839 Other primary ovarian failure: Secondary | ICD-10-CM

## 2016-08-28 DIAGNOSIS — B078 Other viral warts: Secondary | ICD-10-CM | POA: Diagnosis not present

## 2016-09-27 ENCOUNTER — Ambulatory Visit (INDEPENDENT_AMBULATORY_CARE_PROVIDER_SITE_OTHER)
Admission: RE | Admit: 2016-09-27 | Discharge: 2016-09-27 | Disposition: A | Discharge status: Home / Self Care | Payer: Medicare Other | Source: Ambulatory Visit | Attending: Pulmonary Disease | Admitting: Pulmonary Disease

## 2016-09-27 DIAGNOSIS — R911 Solitary pulmonary nodule: Secondary | ICD-10-CM

## 2016-09-27 DIAGNOSIS — R918 Other nonspecific abnormal finding of lung field: Secondary | ICD-10-CM | POA: Diagnosis not present

## 2016-10-30 DIAGNOSIS — J069 Acute upper respiratory infection, unspecified: Secondary | ICD-10-CM | POA: Diagnosis not present

## 2016-11-21 ENCOUNTER — Ambulatory Visit (INDEPENDENT_AMBULATORY_CARE_PROVIDER_SITE_OTHER): Payer: Medicare Other | Admitting: Pulmonary Disease

## 2016-11-21 ENCOUNTER — Encounter: Payer: Self-pay | Admitting: Pulmonary Disease

## 2016-11-21 VITALS — BP 114/72 | HR 74 | Ht 64.0 in | Wt 113.4 lb

## 2016-11-21 DIAGNOSIS — R911 Solitary pulmonary nodule: Secondary | ICD-10-CM | POA: Diagnosis not present

## 2016-11-21 DIAGNOSIS — J449 Chronic obstructive pulmonary disease, unspecified: Secondary | ICD-10-CM

## 2016-11-21 MED ORDER — UMECLIDINIUM-VILANTEROL 62.5-25 MCG/INH IN AEPB: 1.0000 | INHALATION_SPRAY | Freq: Every day | RESPIRATORY_TRACT | 0 refills | Status: DC

## 2016-11-21 NOTE — Assessment & Plan Note (Signed)
Stable on follow-up imaging No further imaging required

## 2016-11-21 NOTE — Addendum Note (Signed)
Addended by: Valerie Salts on: 11/21/2016 11:24 AM   Modules accepted: Orders

## 2016-11-21 NOTE — Assessment & Plan Note (Signed)
Lung function has decreased to 35% Trial of ANORO - 1 puff daily Call us back in 2 weeks to report whether this is working and we will send you a prescription Discussed side effects

## 2016-11-21 NOTE — Progress Notes (Signed)
   Subjective:    Patient ID: Rachel Mcbride, female    DOB: Jun 08, 1946, 71 y.o.   MRN: [id number]  HPI  PCP - Arelia Sneddon   71/F , ex- heavy smoker > 50 Pyrs, for FU of COPD & subcarinal lymphadenopathy.  Breast cancer treated in 2011 07/2010 Spirometry FEV1 at 55%, ratio at 74.  02/2011 quit smoking    11/21/2016   Chief Complaint  Patient presents with  . Follow-up    12 mo f/u for lung nodule. Had a CT chest scan back in Feb 2018.    She developed a chest cold 3 weeks ago, and is now finally feeling better. She denies significant dyspnea but does have to stop and go and she walks for longer distances or up a hill so feel that she is underreporting. No wheezing or sputum production or pedal edema. She does have chronic neuropathy which is attributed to chemotherapy  CT chest 09/2016  was reviewed which shows stable biapical scarring, subcarinal and hilar calcified lymphadenopathy and stable nodules  Spirometry shows FEV1 dropped to 35%   Significant tests/ events   New RUL nodule 02/2011 -resolved in 08/2012.   12/2013 FEV1 49%  Review of Systems neg for any significant sore throat, dysphagia, itching, sneezing, nasal congestion or excess/ purulent secretions, fever, chills, sweats, unintended wt loss, pleuritic or exertional cp, hempoptysis, orthopnea pnd or change in chronic leg swelling. Also denies presyncope, palpitations, heartburn, abdominal pain, nausea, vomiting, diarrhea or change in bowel or urinary habits, dysuria,hematuria, rash, arthralgias, visual complaints, headache, numbness weakness or ataxia.     Objective:   Physical Exam   Gen. Pleasant, well-nourished, in no distress ENT - no thrush, no post nasal drip Neck: No JVD, no thyromegaly, no carotid bruits Lungs: no use of accessory muscles, no dullness to percussion, clear without rales or rhonchi  Cardiovascular: Rhythm regular, heart sounds  normal, no murmurs or gallops, no peripheral  edema Musculoskeletal: No deformities, no cyanosis or clubbing         Assessment & Plan:

## 2016-11-21 NOTE — Patient Instructions (Signed)
CT scan is stable Lung function has decreased to 35% Trial of ANORO - 1 puff daily Call us back in 2 weeks to report whether this is working and we will send you a prescription

## 2016-11-26 ENCOUNTER — Ambulatory Visit
Admission: RE | Admit: 2016-11-26 | Discharge: 2016-11-26 | Disposition: A | Discharge status: Home / Self Care | Payer: Medicare Other | Source: Ambulatory Visit | Attending: Family Medicine | Admitting: Family Medicine

## 2016-11-26 DIAGNOSIS — E2839 Other primary ovarian failure: Secondary | ICD-10-CM

## 2016-11-26 DIAGNOSIS — M810 Age-related osteoporosis without current pathological fracture: Secondary | ICD-10-CM | POA: Diagnosis not present

## 2016-11-26 DIAGNOSIS — Z780 Asymptomatic menopausal state: Secondary | ICD-10-CM | POA: Diagnosis not present

## 2017-03-22 ENCOUNTER — Other Ambulatory Visit: Payer: Self-pay | Admitting: Family Medicine

## 2017-03-22 DIAGNOSIS — Z1231 Encounter for screening mammogram for malignant neoplasm of breast: Secondary | ICD-10-CM

## 2017-04-12 DIAGNOSIS — H524 Presbyopia: Secondary | ICD-10-CM | POA: Diagnosis not present

## 2017-04-12 DIAGNOSIS — H52223 Regular astigmatism, bilateral: Secondary | ICD-10-CM | POA: Diagnosis not present

## 2017-04-12 DIAGNOSIS — H1851 Endothelial corneal dystrophy: Secondary | ICD-10-CM | POA: Diagnosis not present

## 2017-04-12 DIAGNOSIS — H5203 Hypermetropia, bilateral: Secondary | ICD-10-CM | POA: Diagnosis not present

## 2017-05-07 ENCOUNTER — Ambulatory Visit
Admission: RE | Admit: 2017-05-07 | Discharge: 2017-05-07 | Disposition: A | Discharge status: Home / Self Care | Payer: Medicare Other | Source: Ambulatory Visit | Attending: Family Medicine | Admitting: Family Medicine

## 2017-05-07 DIAGNOSIS — Z1231 Encounter for screening mammogram for malignant neoplasm of breast: Secondary | ICD-10-CM

## 2017-05-07 HISTORY — DX: Personal history of antineoplastic chemotherapy: Z92.21

## 2017-05-07 HISTORY — DX: Personal history of irradiation: Z92.3

## 2017-05-22 DIAGNOSIS — Z23 Encounter for immunization: Secondary | ICD-10-CM | POA: Diagnosis not present

## 2017-05-29 ENCOUNTER — Ambulatory Visit: Payer: Medicare Other | Admitting: Adult Health

## 2017-06-12 ENCOUNTER — Ambulatory Visit (INDEPENDENT_AMBULATORY_CARE_PROVIDER_SITE_OTHER): Payer: Medicare Other | Admitting: Adult Health

## 2017-06-12 ENCOUNTER — Encounter: Payer: Self-pay | Admitting: Adult Health

## 2017-06-12 DIAGNOSIS — R911 Solitary pulmonary nodule: Secondary | ICD-10-CM | POA: Diagnosis not present

## 2017-06-12 DIAGNOSIS — J449 Chronic obstructive pulmonary disease, unspecified: Secondary | ICD-10-CM

## 2017-06-12 NOTE — Patient Instructions (Signed)
Continue on current regimen  Follow up with Dr. Elsworth Soho  In 6 months and As needed

## 2017-06-12 NOTE — Assessment & Plan Note (Signed)
Stable nodules on CT chest 09/2016 .

## 2017-06-12 NOTE — Progress Notes (Signed)
@Patient  ID: Rachel Mcbride, female    DOB: 1945/08/28, 71 y.o.   MRN: [id number]  Chief Complaint  Patient presents with  . Follow-up    COPD    Referring provider: Leonard Downing, *  HPI: 71/F , ex- heavy smoker >50 Pyrs, for FU of COPD &subcarinal lymphadenopathy.  Breast cancer treated in 2011 07/2010 Spirometry FEV1 at 55%, ratio at 74.  02/2011 quit smoking   TEST  07/2010 Spirometry FEV1 at 55%, ratio at 74. CT chest 09/2016  was reviewed which shows stable biapical scarring, subcarinal and hilar calcified lymphadenopathy and stable nodules  11/2016 Spirometry shows FEV1 dropped to 35%  06/12/2017 Follow up : COPD  Pt returns for 6 month follow up . Says she is doing okay  Unable to afford ANORO . Declines Atrovent nebs.  Is able to walk on flat surface well . Hard to walk up incline .  No flare of cough or wheezing . She leads a sedentary lifestyle .  Flu shot utd . Cant take pneumonia vaccine.     Allergies  Allergen Reactions  . Pneumococcal Vaccines Itching  . Shellfish-Derived Products Itching    Immunization History  Administered Date(s) Administered  . Influenza Split 05/14/2011, 05/13/2013  . Influenza Whole 05/16/2009, 05/01/2010, 05/13/2012  . Influenza, High Dose Seasonal PF 05/22/2017  . Pneumococcal Polysaccharide-23 03/03/2010  . Td 03/03/2010    Past Medical History:  Diagnosis Date  . Anemia   . Atrial flutter ([place]) 2011   occurred with CHF; no current dysrhythmia  . Breast cancer ([place]) 04/2010  . Cataract    bilateral  . CHF (congestive heart failure) ([place]) 2011  . COPD (chronic obstructive pulmonary disease) ([place])    denies SOB with daily activities  . Cough 08/31/2011  . Cough, persistent 09/21/2015  . Dysuria 11/22/2014  . Hypertension    under control; has been on med. x 2 yrs.  . Lung nodule seen on imaging study 09/21/2015  . Neuropathy of leg    since chemo; right leg  . Osteopenia 12/31/2011  . Personal history of  chemotherapy   . Personal history of radiation therapy   . Pneumonia 2011  . Scoliosis    since chemo  . Weight loss 09/21/2015    Tobacco History: History  Smoking Status  . Former Smoker  . Packs/day: 2.00  . Years: 50.00  . Types: Cigarettes  . Quit date: 07/14/2010  Smokeless Tobacco  . Never Used    Comment: uses electronic cigarette   Counseling given: Not Answered   Outpatient Encounter Prescriptions as of 06/12/2017  Medication Sig  . alendronate (FOSAMAX) 35 MG tablet TAKE 1 TABLET EVERY WEEK  . aspirin 81 MG tablet Take 81 mg by mouth daily.  . Calcium Carbonate (CALCIUM 600 PO) Take 2 capsules by mouth daily.   . Cholecalciferol (VITAMIN D PO) Take 2,000 Units by mouth daily.   Marland Kitchen levothyroxine (SYNTHROID, LEVOTHROID) 75 MCG tablet Take 75 mcg by mouth daily.  Marland Kitchen umeclidinium-vilanterol (ANORO ELLIPTA) 62.5-25 MCG/INH AEPB Inhale 1 puff into the lungs daily. (Patient not taking: Reported on 06/12/2017)   No facility-administered encounter medications on file as of 06/12/2017.      Review of Systems  Constitutional:   No  weight loss, night sweats,  Fevers, chills,  +fatigue, or  lassitude.  HEENT:   No headaches,  Difficulty swallowing,  Tooth/dental problems, or  Sore throat,  No sneezing, itching, ear ache, nasal congestion, post nasal drip,   CV:  No chest pain,  Orthopnea, PND, swelling in lower extremities, anasarca, dizziness, palpitations, syncope.   GI  No heartburn, indigestion, abdominal pain, nausea, vomiting, diarrhea, change in bowel habits, loss of appetite, bloody stools.   Resp: No shortness of breath with exertion or at rest.  No excess mucus, no productive cough,  No non-productive cough,  No coughing up of blood.  No change in color of mucus.  No wheezing.  No chest wall deformity  Skin: no rash or lesions.  GU: no dysuria, change in color of urine, no urgency or frequency.  No flank pain, no hematuria   MS:  No joint pain  or swelling.  No decreased range of motion.  No back pain.    Physical Exam  BP 116/68 (BP Location: Left Arm, Cuff Size: Normal)   Pulse 83   Ht 4' 11.5" (1.511 m)   Wt 112 lb 9.6 oz (51.1 kg)   SpO2 98%   BMI 22.36 kg/m   GEN: A/Ox3; pleasant , NAD, thin and frail    HEENT:  /AT,  EACs-clear, TMs-wnl, NOSE-clear, THROAT-clear, no lesions, no postnasal drip or exudate noted.   NECK:  Supple w/ fair ROM; no JVD; normal carotid impulses w/o bruits; no thyromegaly or nodules palpated; no lymphadenopathy.    RESP  Clear  P & A; w/o, wheezes/ rales/ or rhonchi. no accessory muscle use, no dullness to percussion  CARD:  RRR, no m/r/g, no peripheral edema, pulses intact, no cyanosis or clubbing.  GI:   Soft & nt; nml bowel sounds; no organomegaly or masses detected.   Musco: Warm bil, no deformities or joint swelling noted.   Neuro: alert, no focal deficits noted.    Skin: Warm, no lesions or rashes    Lab Results:  CBC  BNP No results found for: BNP   Imaging: No results found.   Assessment & Plan:   COPD (chronic obstructive pulmonary disease) ([place]) Compensated without flare  Would like to have on maintenance inhaler but pt declines   Plan  Patient Instructions  Continue on current regimen  Follow up with Dr. Elsworth Soho  In 6 months and As needed       Lung nodule Stable nodules on CT chest 09/2016 .       Rexene Edison, NP 06/12/2017

## 2017-06-12 NOTE — Assessment & Plan Note (Signed)
Compensated without flare  Would like to have on maintenance inhaler but pt declines   Plan  Patient Instructions  Continue on current regimen  Follow up with Dr. Elsworth Soho  In 6 months and As needed

## 2017-08-15 DIAGNOSIS — M810 Age-related osteoporosis without current pathological fracture: Secondary | ICD-10-CM | POA: Diagnosis not present

## 2017-08-15 DIAGNOSIS — E039 Hypothyroidism, unspecified: Secondary | ICD-10-CM | POA: Diagnosis not present

## 2017-08-15 DIAGNOSIS — Z79899 Other long term (current) drug therapy: Secondary | ICD-10-CM | POA: Diagnosis not present

## 2017-08-15 DIAGNOSIS — Z049 Encounter for examination and observation for unspecified reason: Secondary | ICD-10-CM | POA: Diagnosis not present

## 2017-08-15 DIAGNOSIS — Z0000 Encounter for general adult medical examination without abnormal findings: Secondary | ICD-10-CM | POA: Diagnosis not present

## 2017-12-17 ENCOUNTER — Encounter: Payer: Self-pay | Admitting: Pulmonary Disease

## 2017-12-17 ENCOUNTER — Ambulatory Visit: Payer: Medicare Other | Admitting: Pulmonary Disease

## 2017-12-17 VITALS — BP 116/72 | HR 67 | Ht 59.5 in | Wt 110.6 lb

## 2017-12-17 DIAGNOSIS — J439 Emphysema, unspecified: Secondary | ICD-10-CM | POA: Diagnosis not present

## 2017-12-17 DIAGNOSIS — R911 Solitary pulmonary nodule: Secondary | ICD-10-CM

## 2017-12-17 DIAGNOSIS — J432 Centrilobular emphysema: Secondary | ICD-10-CM

## 2017-12-17 NOTE — Assessment & Plan Note (Signed)
Stable CT imaging

## 2017-12-17 NOTE — Progress Notes (Signed)
   Subjective:    Patient ID: Rachel Mcbride, female    DOB: Dec 07, 1945, 72 y.o.   MRN: [id number]  HPI  72 yo ex- heavy smoker >50 Pyrs, for FU of COPD &subcarinal lymphadenopathy.  Breast cancer treated in 2011  02/2011 quit smoking  , but continues to use E cigarettes. Breathing is limited more by chronic neuropathy which is attributed to chemotherapy. Was given a sample of ANORO last year but did not think that this helped him much and has not needed rescue inhaler much. Denies frequent chest colds   Spirometry showed FEV1 of 42%, ratio 46, FVC of 68%    Significant tests/ events  New RUL nodule 02/2011 -resolved in 08/2012.  CT chest 09/2016  was reviewed which shows stable biapical scarring, subcarinal and hilar calcified lymphadenopathy and stable nodules  Spirometry 73/2018  FEV1 dropped to 35% 73/2011 Spirometry FEV1 at 55%, ratio at 74.  73/2015 FEV1 49%   Review of Systems Patient denies significant dyspnea,cough, hemoptysis,  chest pain, palpitations, pedal edema, orthopnea, paroxysmal nocturnal dyspnea, lightheadedness, nausea, vomiting, abdominal or  leg pains      Objective:   Physical Exam  Gen. Pleasant, thin, frail, in no distress ENT - no thrush, no post nasal drip Neck: No JVD, no thyromegaly, no carotid bruits Lungs: no use of accessory muscles, no dullness to percussion, clear without rales or rhonchi  Cardiovascular: Rhythm regular, heart sounds  normal, no murmurs or gallops, no peripheral edema Musculoskeletal: No deformities, no cyanosis or clubbing        Assessment & Plan:

## 2017-12-17 NOTE — Patient Instructions (Signed)
Rx for albuterol MDI 2 puffs every 6h as needed

## 2017-12-17 NOTE — Assessment & Plan Note (Signed)
Again she prefers to be off medications. We will provide albuterol for use as as-needed basis. Increase physical activity

## 2018-03-28 ENCOUNTER — Other Ambulatory Visit: Payer: Self-pay | Admitting: Family Medicine

## 2018-03-28 DIAGNOSIS — Z1231 Encounter for screening mammogram for malignant neoplasm of breast: Secondary | ICD-10-CM

## 2018-04-28 DIAGNOSIS — H5203 Hypermetropia, bilateral: Secondary | ICD-10-CM | POA: Diagnosis not present

## 2018-04-28 DIAGNOSIS — H1851 Endothelial corneal dystrophy: Secondary | ICD-10-CM | POA: Diagnosis not present

## 2018-04-28 DIAGNOSIS — H52223 Regular astigmatism, bilateral: Secondary | ICD-10-CM | POA: Diagnosis not present

## 2018-05-08 ENCOUNTER — Ambulatory Visit
Admission: RE | Admit: 2018-05-08 | Discharge: 2018-05-08 | Disposition: A | Discharge status: Home / Self Care | Payer: Medicare Other | Source: Ambulatory Visit | Attending: Family Medicine | Admitting: Family Medicine

## 2018-05-08 DIAGNOSIS — Z1231 Encounter for screening mammogram for malignant neoplasm of breast: Secondary | ICD-10-CM

## 2018-06-04 ENCOUNTER — Ambulatory Visit: Payer: Medicare Other | Admitting: Cardiovascular Disease

## 2018-06-20 ENCOUNTER — Ambulatory Visit (INDEPENDENT_AMBULATORY_CARE_PROVIDER_SITE_OTHER)
Admission: RE | Admit: 2018-06-20 | Discharge: 2018-06-20 | Disposition: A | Discharge status: Home / Self Care | Payer: Medicare Other | Source: Ambulatory Visit | Attending: Adult Health | Admitting: Adult Health

## 2018-06-20 ENCOUNTER — Encounter: Payer: Self-pay | Admitting: Adult Health

## 2018-06-20 ENCOUNTER — Ambulatory Visit: Payer: Medicare Other | Admitting: Adult Health

## 2018-06-20 VITALS — BP 116/74 | HR 65 | Ht 61.0 in | Wt 105.8 lb

## 2018-06-20 DIAGNOSIS — J432 Centrilobular emphysema: Secondary | ICD-10-CM | POA: Diagnosis not present

## 2018-06-20 NOTE — Progress Notes (Signed)
Called spoke with patient, advised of cxr results / recs as stated by TP.  Pt verbalized understanding and denied any questions.

## 2018-06-20 NOTE — Progress Notes (Signed)
@Patient  ID: Rachel Mcbride, female    DOB: [birth year], 72 y.o.   MRN: [id number]  Chief Complaint  Patient presents with  . Follow-up    COPD    Referring provider: Leonard Downing, *  HPI: 72 yo ex- heavy smoker >50 Pyrs, for FU of COPD &subcarinal lymphadenopathy.  Medical hx significant for Breast cancer treated in 2011   TEST/EVENTS :  Spirometry showed FEV1 of 42%, ratio 46, FVC of 68%  New RUL nodule 02/2011 -resolved in 08/2012.  CT chest2/2018was reviewed which shows stable biapical scarring, subcarinal and hilar calcified lymphadenopathy and stable nodules 07/2010 Spirometry FEV1 at 55%, ratio at 74.  12/2013 FEV1 49% Spirometry 11/2016  FEV1 dropped to 35%  06/20/2018 Follow up : COPD  Patient presents for a six-month follow-up.  Patient has underlying severe COPD.  She says overall breathing is doing the same.  She is somewhat sedentary.  She does not wish to be on any inhalers.  Says she has no flare of her cough or wheezing.  She had no flare of her COPD and required no antibiotics or steroids over the last 6 months. Patient does continue to use e-cigarettes, encouraged on cessation please. She says her and her husband have been traveling and doing well.  Patient had a abnormal CT chest dating back several years.  Most recent CT chest in February 2018 showed stable pulmonary nodules and scarring.   Allergies  Allergen Reactions  . Pneumococcal Vaccines Itching  . Shellfish-Derived Products Itching    Immunization History  Administered Date(s) Administered  . Influenza Split 05/14/2011, 05/13/2013  . Influenza Whole 05/16/2009, 05/01/2010, 05/13/2012  . Influenza, High Dose Seasonal PF 05/22/2017, 05/12/2018  . Pneumococcal Polysaccharide-23 03/03/2010  . Td 03/03/2010    Past Medical History:  Diagnosis Date  . Anemia   . Atrial flutter ([place]) 2011   occurred with CHF; no current dysrhythmia  . Breast cancer ([place]) 04/2010  . Cataract    bilateral  . CHF (congestive heart failure) ([place]) 2011  . COPD (chronic obstructive pulmonary disease) ([place])    denies SOB with daily activities  . Cough 08/31/2011  . Cough, persistent 09/21/2015  . Dysuria 11/22/2014  . Hypertension    under control; has been on med. x 2 yrs.  . Lung nodule seen on imaging study 09/21/2015  . Neuropathy of leg    since chemo; right leg  . Osteopenia 12/31/2011  . Personal history of chemotherapy   . Personal history of radiation therapy   . Pneumonia 2011  . Scoliosis    since chemo  . Weight loss 09/21/2015    Tobacco History: Social History   Tobacco Use  Smoking Status Former Smoker  . Packs/day: 2.00  . Years: 50.00  . Pack years: 100.00  . Types: Cigarettes  . Last attempt to quit: 07/14/2010  . Years since quitting: 7.9  Smokeless Tobacco Never Used  Tobacco Comment   uses electronic cigarette   Counseling given: Not Answered Comment: uses electronic cigarette   Outpatient Medications Prior to Visit  Medication Sig Dispense Refill  . alendronate (FOSAMAX) 35 MG tablet TAKE 1 TABLET EVERY WEEK 12 tablet 3  . Calcium Carbonate (CALCIUM 600 PO) Take 2 capsules by mouth daily.     . Cholecalciferol (VITAMIN D PO) Take 2,000 Units by mouth daily.     Marland Kitchen levothyroxine (SYNTHROID, LEVOTHROID) 75 MCG tablet Take 75 mcg by mouth daily.     No facility-administered medications prior to  visit.      Review of Systems  Constitutional:   No  weight loss, night sweats,  Fevers, chills,  +fatigue, or  lassitude.  HEENT:   No headaches,  Difficulty swallowing,  Tooth/dental problems, or  Sore throat,                No sneezing, itching, ear ache, nasal congestion, post nasal drip,   CV:  No chest pain,  Orthopnea, PND, swelling in lower extremities, anasarca, dizziness, palpitations, syncope.   GI  No heartburn, indigestion, abdominal pain, nausea, vomiting, diarrhea, change in bowel habits, loss of appetite, bloody stools.   Resp:  No  chest wall deformity  Skin: no rash or lesions.  GU: no dysuria, change in color of urine, no urgency or frequency.  No flank pain, no hematuria   MS:  No joint pain or swelling.  No decreased range of motion.  No back pain.    Physical Exam  BP 116/74 (BP Location: Left Arm, Cuff Size: Normal)   Pulse 65   Ht 5\' 1"  (1.549 m)   Wt 105 lb 12.8 oz (48 kg)   SpO2 100%   BMI 19.99 kg/m   GEN: A/Ox3; pleasant , NAD, elderly    HEENT:  [place]/AT,  EACs-clear, TMs-wnl, NOSE-clear, THROAT-clear, no lesions, no postnasal drip or exudate noted.   NECK:  Supple w/ fair ROM; no JVD; normal carotid impulses w/o bruits; no thyromegaly or nodules palpated; no lymphadenopathy.    RESP  Decreased BS in bases  no accessory muscle use, no dullness to percussion  CARD:  RRR, no m/r/g, no peripheral edema, pulses intact, no cyanosis or clubbing.  GI:   Soft & nt; nml bowel sounds; no organomegaly or masses detected.   Musco: Warm bil, no deformities or joint swelling noted.   Neuro: alert, no focal deficits noted.    Skin: Warm, no lesions or rashes    Lab Results:  CBC  BMET  BNP No results found for: BNP  ProBNP  Imaging: Dg Chest 2 View  Result Date: 06/20/2018 CLINICAL DATA:  Chronic intermittent cough and dyspnea. History of COPD and breast carcinoma. Smoker. EXAM: CHEST - 2 VIEW COMPARISON:  Chest CT, 09/27/2016. FINDINGS: Cardiac silhouette normal in size and configuration. No mediastinal or hilar masses. No evidence of adenopathy. Both hila retracted superiorly from upper lobe scarring. More confluent right apical pleuroparenchymal scarring, stable from the prior CT. Mild scarring at the base right lung. Lungs are hyperexpanded but otherwise clear with no evidence of pneumonia or edema. No pleural effusion or pneumothorax. Small hiatal hernia. Skeletal structures are demineralized but intact Stable changes prior right breast surgery. IMPRESSION: 1. No acute cardiopulmonary  disease. 2. COPD and areas lung scarring, most right apex Electronically Signed   By: Lajean Manes M.D.   On: 06/20/2018 12:17      No flowsheet data found.  No results found for: NITRICOXIDE      Assessment & Plan:   COPD (chronic obstructive pulmonary disease) ([facility]) Severe COPD currently stable.  Patient is not on a maintenance therapy.  She declines any routine inhalers.  Advised on e-cigarette cessation.  Check cxr today   Plan  Patient Instructions  Continue on current regimen  Chest xray today .  Follow up with Dr. Elsworth Soho  In 6 months and As needed           Rexene Edison, NP 06/20/2018

## 2018-06-20 NOTE — Assessment & Plan Note (Signed)
Severe COPD currently stable.  Patient is not on a maintenance therapy.  She declines any routine inhalers.  Advised on e-cigarette cessation.  Check cxr today   Plan  Patient Instructions  Continue on current regimen  Chest xray today .  Follow up with Dr. Elsworth Soho  In 6 months and As needed

## 2018-06-20 NOTE — Patient Instructions (Addendum)
Continue on current regimen  Chest xray today .  Follow up with Dr. Elsworth Soho  In 6 months and As needed

## 2018-07-14 ENCOUNTER — Telehealth: Payer: Self-pay | Admitting: *Deleted

## 2018-07-14 NOTE — Telephone Encounter (Signed)
Husband left a message stating they are trying to figure out if she needs to continue Fosamax. PCP deferred to Dr Alvy Bimler as the original prescriber.

## 2018-07-14 NOTE — Telephone Encounter (Signed)
She is not followed here If PCP refused to prescribe, tell PCP to refer to endocrinologist to manage

## 2019-01-06 ENCOUNTER — Ambulatory Visit: Payer: Medicare Other | Admitting: Pulmonary Disease

## 2019-01-12 ENCOUNTER — Other Ambulatory Visit: Payer: Self-pay

## 2019-01-12 ENCOUNTER — Encounter: Payer: Self-pay | Admitting: Pulmonary Disease

## 2019-01-12 ENCOUNTER — Ambulatory Visit (INDEPENDENT_AMBULATORY_CARE_PROVIDER_SITE_OTHER): Payer: Medicare Other | Admitting: Pulmonary Disease

## 2019-01-12 DIAGNOSIS — J432 Centrilobular emphysema: Secondary | ICD-10-CM | POA: Diagnosis not present

## 2019-01-12 MED ORDER — ALBUTEROL SULFATE HFA 108 (90 BASE) MCG/ACT IN AERS: 2.0000 | INHALATION_SPRAY | Freq: Four times a day (QID) | RESPIRATORY_TRACT | 2 refills | Status: DC | PRN

## 2019-01-12 NOTE — Patient Instructions (Signed)
Rx for albuterol MDI -2 puffs every 6 hours as needed for wheezing or shortness of breath or tiredness  We can trial Anoro again if symptoms get worse

## 2019-01-12 NOTE — Progress Notes (Signed)
   Subjective:    Patient ID: Rachel Mcbride, female    DOB: Sep 11, 1945, 73 y.o.   MRN: [id number]  HPI  73 yo ex- heavy smoker >50 Pyrs, for FU of COPD &subcarinal lymphadenopathy.  Breast cancer treated in 2011  02/2011 quit smoking  , but continues to use E cigarettes. Breathing is limited more by chronic neuropathy which is attributed to chemotherapy. Was given a sample of ANORO 2018 but did not want to stay on it  She complains of increased tiredness over the past few months.  Occasionally has dyspnea, but no wheezing Has not had interim episodes of bronchitis  [place] so [place] with social distancing    Significant tests/ events  New RUL nodule 02/2011 -resolved in 08/2012.  CT chest2/2018was reviewed which shows stable biapical scarring, subcarinal and hilar calcified lymphadenopathy and stable nodules   Spirometry 12/2017  FEV1 of 42%, ratio 46, FVC of 68% Spirometry 11/2016  FEV1 dropped to 35% 07/2010 Spirometry FEV1 at 55%, ratio at 74.  12/2013 FEV1 49%   Review of Systems Patient denies significant dyspnea,cough, hemoptysis,  chest pain, palpitations, pedal edema, orthopnea, paroxysmal nocturnal dyspnea, lightheadedness, nausea, vomiting, abdominal or  leg pains      Objective:   Physical Exam  Gen. Pleasant, thin frail, in no distress ENT - no thrush, no pallor/icterus,no post nasal drip Neck: No JVD, no thyromegaly, no carotid bruits Lungs: no use of accessory muscles, no dullness to percussion, clear without rales or rhonchi  Cardiovascular: Rhythm regular, heart sounds  normal, no murmurs or gallops, no peripheral edema Musculoskeletal: No deformities, no cyanosis or clubbing         Assessment & Plan:

## 2019-01-12 NOTE — Assessment & Plan Note (Signed)
Rx for albuterol MDI -2 puffs every 6 hours as needed for wheezing or shortness of breath or tiredness  We can trial Anoro again if symptoms get worse-she would like to hold off for now

## 2019-01-12 NOTE — Assessment & Plan Note (Signed)
COPD could be a cause, also could be related to some underlying depression

## 2019-04-03 ENCOUNTER — Other Ambulatory Visit: Payer: Self-pay | Admitting: Family Medicine

## 2019-04-03 DIAGNOSIS — Z1231 Encounter for screening mammogram for malignant neoplasm of breast: Secondary | ICD-10-CM

## 2019-05-20 ENCOUNTER — Ambulatory Visit
Admission: RE | Admit: 2019-05-20 | Discharge: 2019-05-20 | Disposition: A | Discharge status: Home / Self Care | Payer: Medicare Other | Source: Ambulatory Visit | Attending: Family Medicine | Admitting: Family Medicine

## 2019-05-20 ENCOUNTER — Other Ambulatory Visit: Payer: Self-pay

## 2019-05-20 DIAGNOSIS — Z1231 Encounter for screening mammogram for malignant neoplasm of breast: Secondary | ICD-10-CM

## 2019-07-15 ENCOUNTER — Ambulatory Visit: Payer: Medicare Other | Admitting: Adult Health

## 2019-12-11 ENCOUNTER — Other Ambulatory Visit: Payer: Self-pay

## 2019-12-11 ENCOUNTER — Ambulatory Visit: Payer: Medicare Other | Admitting: Primary Care

## 2019-12-11 ENCOUNTER — Encounter: Payer: Self-pay | Admitting: Primary Care

## 2019-12-11 ENCOUNTER — Ambulatory Visit: Payer: Medicare Other | Admitting: Adult Health

## 2019-12-11 VITALS — BP 132/60 | HR 89 | Temp 97.4°F | Ht 63.0 in | Wt 104.2 lb

## 2019-12-11 DIAGNOSIS — J432 Centrilobular emphysema: Secondary | ICD-10-CM

## 2019-12-11 DIAGNOSIS — F17210 Nicotine dependence, cigarettes, uncomplicated: Secondary | ICD-10-CM | POA: Diagnosis not present

## 2019-12-11 DIAGNOSIS — F17200 Nicotine dependence, unspecified, uncomplicated: Secondary | ICD-10-CM

## 2019-12-11 NOTE — Patient Instructions (Addendum)
  Recommendations: Sample Anoro- take one puff daily in the morning (If you find this beneficial call our office and we will send in a prescription)  Referral: Lung cancer screening program   Follow-up: 3 months with Dr. Elsworth Soho

## 2019-12-11 NOTE — Progress Notes (Signed)
@Patient  ID: Rachel Mcbride, female    DOB: 02-Mar-1946, 74 y.o.   MRN: [id number]  Chief Complaint  Patient presents with  . Follow-up    f/u emphysema    Referring provider: Leonard Downing, *  HPI: 74 year old female, current smoker (100 pack year hx). PMH significant for COPD, emphysema, breast cancer (s/p chemotherapy 10/01/19), chronic diastolic heart failure, aflutter, graves disease. Patient of Dr. Elsworth Soho, last seen on 01/12/19. Not currently on maintenance inhaler, if symptoms worsen recommend resuming Anoro.   12/11/2019 Patient presents today for regular follow-up. Accompanied by her husband. She is doing well. She does report fatigue, short of breath and hacking cough. Husband feels her fatigue is related to depression. She is using e-cigarettes and is not interested in quit. She understands the risk associated with this. She agrees to being referred to lung cancer screening program.   Allergies  Allergen Reactions  . Pneumococcal Vaccines Itching  . Shellfish-Derived Products Itching    Immunization History  Administered Date(s) Administered  . Influenza Split 05/14/2011, 05/13/2013  . Influenza Whole 05/16/2009, 05/01/2010, 05/13/2012  . Influenza, High Dose Seasonal PF 05/22/2017, 05/12/2018  . Pneumococcal Polysaccharide-23 03/03/2010  . Td 03/03/2010    Past Medical History:  Diagnosis Date  . Anemia   . Atrial flutter ([place]) 2011   occurred with CHF; no current dysrhythmia  . Breast cancer ([place]) 04/2010  . Cataract    bilateral  . CHF (congestive heart failure) ([place]) 2011  . COPD (chronic obstructive pulmonary disease) ([place])    denies SOB with daily activities  . Cough 08/31/2011  . Cough, persistent 09/21/2015  . Dysuria 11/22/2014  . Hypertension    under control; has been on med. x 2 yrs.  . Lung nodule seen on imaging study 09/21/2015  . Neuropathy of leg    since chemo; right leg  . Osteopenia 12/31/2011  . Personal history of chemotherapy   .  Personal history of radiation therapy   . Pneumonia 2011  . Scoliosis    since chemo  . Weight loss 09/21/2015    Tobacco History: Social History   Tobacco Use  Smoking Status Former Smoker  . Packs/day: 2.00  . Years: 50.00  . Pack years: 100.00  . Types: Cigarettes  . Quit date: 07/14/2010  . Years since quitting: 9.4  Smokeless Tobacco Never Used  Tobacco Comment   uses electronic cigarette   Counseling given: Not Answered Comment: uses electronic cigarette   Outpatient Medications Prior to Visit  Medication Sig Dispense Refill  . albuterol (VENTOLIN HFA) 108 (90 Base) MCG/ACT inhaler Inhale 2 puffs into the lungs every 6 (six) hours as needed for wheezing or shortness of breath. 1 Inhaler 2  . alendronate (FOSAMAX) 35 MG tablet TAKE 1 TABLET EVERY WEEK 12 tablet 3  . Calcium Carbonate (CALCIUM 600 PO) Take 2 capsules by mouth daily.     . Cholecalciferol (VITAMIN D PO) Take 2,000 Units by mouth daily.     Marland Kitchen levothyroxine (SYNTHROID, LEVOTHROID) 75 MCG tablet Take 75 mcg by mouth daily.     No facility-administered medications prior to visit.    Review of Systems  Review of Systems  Constitutional: Positive for fatigue.  Respiratory: Positive for cough and shortness of breath.    Physical Exam  BP 132/60 (BP Location: Left Arm, Cuff Size: Normal)   Pulse 89   Temp (!) 97.4 F (36.3 C) (Temporal)   Ht 5\' 3"  (1.6 m)   Wt  104 lb 3.2 oz (47.3 kg)   SpO2 97%   BMI 18.46 kg/m  Physical Exam Constitutional:      General: She is not in acute distress.    Appearance: Normal appearance. She is not ill-appearing or toxic-appearing.  HENT:     Mouth/Throat:     Mouth: Mucous membranes are moist.     Pharynx: Oropharynx is clear.  Cardiovascular:     Rate and Rhythm: Normal rate and regular rhythm.  Pulmonary:     Effort: Pulmonary effort is normal.     Breath sounds: Normal breath sounds. No wheezing.  Musculoskeletal:     Cervical back: Normal range of motion  and neck supple.  Skin:    General: Skin is warm and dry.     Coloration: Skin is not pale.     Findings: No bruising or rash.  Neurological:     General: No focal deficit present.     Mental Status: She is alert. Mental status is at baseline.  Psychiatric:        Mood and Affect: Mood normal.        Thought Content: Thought content normal.        Judgment: Judgment normal.      Lab Results:  CBC    Component Value Date/Time   WBC 4.0 04/05/2016 0914   WBC 4.2 10/15/2010 1520   RBC 3.86 04/05/2016 0914   RBC 2.66 (L) 10/15/2010 1520   HGB 11.3 (L) 04/05/2016 0914   HCT 35.4 04/05/2016 0914   PLT 205 04/05/2016 0914   MCV 91.7 04/05/2016 0914   MCH 29.3 04/05/2016 0914   MCH 30.8 10/15/2010 1520   MCHC 31.9 04/05/2016 0914   MCHC 31.2 10/15/2010 1520   RDW 13.4 04/05/2016 0914   LYMPHSABS 0.6 (L) 04/05/2016 0914   MONOABS 0.4 04/05/2016 0914   EOSABS 0.2 04/05/2016 0914   BASOSABS 0.1 04/05/2016 0914    BMET    Component Value Date/Time   NA 141 11/22/2014 1005   K 4.3 11/22/2014 1005   CL 99 03/22/2014 1227   CL 104 12/24/2012 0912   CO2 28 11/22/2014 1005   GLUCOSE 74 11/22/2014 1005   GLUCOSE 87 12/24/2012 0912   BUN 22.7 11/22/2014 1005   CREATININE 0.9 11/22/2014 1005   CALCIUM 10.4 11/22/2014 1005   GFRNONAA 86 (L) 09/03/2011 1050   GFRAA >90 09/03/2011 1050    BNP No results found for: BNP  ProBNP    Component Value Date/Time   PROBNP 288.0 (H) 07/19/2010 0450    Imaging: No results found.   Assessment & Plan:   COPD (chronic obstructive pulmonary disease) ([place]) - Continues to experience chronic fatigue. Associated shortness of breath with moderate activity and intermittent hacking cough  - Re-try Anoro 1 puff daily (sample given, patient to notify office if beneficial and will send in RX); continue Albuterol hfa 2 puffs every 6 hours for shortness of breath or wheezing - FU in 3 months   Current every day smoker - Current  E-cigarettes use, 100 pack year hx - Patient is not ready to quit smoking, >3 mins spent on cessation counseling  - Refer to lung cancer screening program      Martyn Ehrich, NP 12/12/2019

## 2019-12-12 ENCOUNTER — Encounter: Payer: Self-pay | Admitting: Primary Care

## 2019-12-12 DIAGNOSIS — F17200 Nicotine dependence, unspecified, uncomplicated: Secondary | ICD-10-CM | POA: Insufficient documentation

## 2019-12-12 NOTE — Assessment & Plan Note (Addendum)
-   Current E-cigarettes use, 100 pack year hx - Patient is not ready to quit smoking, >3 mins spent on cessation counseling  - Refer to lung cancer screening program

## 2019-12-12 NOTE — Assessment & Plan Note (Addendum)
-   Continues to experience chronic fatigue. Associated shortness of breath with moderate activity and intermittent hacking cough  - Re-try Anoro 1 puff daily (sample given, patient to notify office if beneficial and will send in RX); continue Albuterol hfa 2 puffs every 6 hours for shortness of breath or wheezing - FU in 3 months

## 2019-12-21 ENCOUNTER — Telehealth: Payer: Self-pay | Admitting: Pulmonary Disease

## 2019-12-21 DIAGNOSIS — Z87891 Personal history of nicotine dependence: Secondary | ICD-10-CM

## 2019-12-21 NOTE — Telephone Encounter (Signed)
Spoke with pt and scheduled SDMV 01/18/20 1:30 CT ordered Nothing further needed

## 2020-01-18 ENCOUNTER — Encounter: Payer: Self-pay | Admitting: Acute Care

## 2020-01-18 ENCOUNTER — Ambulatory Visit
Admission: RE | Admit: 2020-01-18 | Discharge: 2020-01-18 | Disposition: A | Discharge status: Home / Self Care | Payer: Medicare Other | Source: Ambulatory Visit | Attending: Acute Care | Admitting: Acute Care

## 2020-01-18 ENCOUNTER — Ambulatory Visit: Payer: Medicare Other | Admitting: Acute Care

## 2020-01-18 ENCOUNTER — Other Ambulatory Visit: Payer: Self-pay

## 2020-01-18 VITALS — BP 110/50 | HR 78 | Temp 98.0°F | Ht 63.0 in | Wt 104.2 lb

## 2020-01-18 DIAGNOSIS — Z87891 Personal history of nicotine dependence: Secondary | ICD-10-CM

## 2020-01-18 NOTE — Patient Instructions (Signed)
Thank you for participating in the [facility]. It was our pleasure to meet you today. We will call you with the results of your scan within the next few days. Your scan will be assigned a Lung RADS category score by the physicians reading the scans.  This Lung RADS score determines follow up scanning.  See below for description of categories, and follow up screening recommendations. We will be in touch to schedule your follow up screening annually or based on recommendations of our providers. We will fax a copy of your scan results to your Primary Care Physician, or the physician who referred you to the program, to ensure they have the results. Please call the office if you have any questions or concerns regarding your scanning experience or results.  Our office number is [phone]. Please speak with Denise Phelps, RN. She is our Lung Cancer Screening RN. If she is unavailable when you call, please have the office staff send her a message. She will return your call at her earliest convenience. Remember, if your scan is normal, we will scan you annually as long as you continue to meet the criteria for the program. (Age 55-77, Current smoker or smoker who has quit within the last 15 years). If you are a smoker, remember, quitting is the single most powerful action that you can take to decrease your risk of lung cancer and other pulmonary, breathing related problems. We know quitting is hard, and we are here to help.  Please let us know if there is anything we can do to help you meet your goal of quitting. If you are a former smoker, congratulations. We are proud of you! Remain smoke free! Remember you can refer friends or family members through the number above.  We will screen them to make sure they meet criteria for the program. Thank you for helping us take better care of you by participating in Lung Screening.  Lung RADS Categories:  Lung RADS 1: no nodules  or definitely non-concerning nodules.  Recommendation is for a repeat annual scan in 12 months.  Lung RADS 2:  nodules that are non-concerning in appearance and behavior with a very low likelihood of becoming an active cancer. Recommendation is for a repeat annual scan in 12 months.  Lung RADS 3: nodules that are probably non-concerning , includes nodules with a low likelihood of becoming an active cancer.  Recommendation is for a 6-month repeat screening scan. Often noted after an upper respiratory illness. We will be in touch to make sure you have no questions, and to schedule your 6-month scan.  Lung RADS 4 A: nodules with concerning findings, recommendation is most often for a follow up scan in 3 months or additional testing based on our provider's assessment of the scan. We will be in touch to make sure you have no questions and to schedule the recommended 3 month follow up scan.  Lung RADS 4 B:  indicates findings that are concerning. We will be in touch with you to schedule additional diagnostic testing based on our provider's  assessment of the scan.   

## 2020-01-18 NOTE — Progress Notes (Addendum)
Shared Decision Making Visit Lung Cancer Screening Program [phone])   Eligibility:  Age 74 y.o.  Pack Years Smoking History Calculation 47 pack year smoking history (# packs/per year x # years smoked)  Recent History of coughing up blood  no  Unexplained weight loss? no ( >Than 15 pounds within the last 6 months )  Prior History Lung / other cancer no (Diagnosis within the last 5 years already requiring surveillance chest CT Scans).  Smoking Status Former Smoker  Former Smokers: Years since quit: 9 years  Quit Date: 2012  Pt. Is still smoking e cigarettes  Visit Components:  Discussion included one or more decision making aids. yes  Discussion included risk/benefits of screening. yes  Discussion included potential follow up diagnostic testing for abnormal scans. yes  Discussion included meaning and risk of over diagnosis. yes  Discussion included meaning and risk of False Positives. yes  Discussion included meaning of total radiation exposure. yes  Counseling Included:  Importance of adherence to annual lung cancer LDCT screening. yes  Impact of comorbidities on ability to participate in the program. yes  Ability and willingness to under diagnostic treatment. yes  Smoking Cessation Counseling:  Current Smokers:   Discussed importance of smoking cessation. yes  Information about tobacco cessation classes and interventions provided to patient. yes  Patient provided with "ticket" for LDCT Scan. yes  Symptomatic Patient. no  Counseling  Diagnosis Code: Tobacco Use Z72.0  Asymptomatic Patient yes  Counseling (Intermediate counseling: > three minutes counseling) [id number]  Former Smokers:   Discussed the importance of maintaining cigarette abstinence. yes  Diagnosis Code: Personal History of Nicotine Dependence. G25.427  Information about tobacco cessation classes and interventions provided to patient. Yes  Patient provided with "ticket" for LDCT Scan.  yes  Written Order for Lung Cancer Screening with LDCT placed in Epic. Yes (CT Chest Lung Cancer Screening Low Dose W/O CM) [id number] Z12.2-Screening of respiratory organs Z87.891-Personal history of nicotine dependence  BP (!) 110/50 (BP Location: Left Arm, Cuff Size: Normal)   Pulse 78   Temp 98 F (36.7 C) (Oral)   Ht 5\' 3"  (1.6 m)   Wt 104 lb 3.2 oz (47.3 kg)   SpO2 98%   BMI 18.46 kg/m    I spent 25 minutes of face to face time with Ms. Crunkleton discussing the risks and benefits of lung cancer screening. We viewed a power point together that explained in detail the above noted topics. We took the time to pause the power point at intervals to allow for questions to be asked and answered to ensure understanding. We discussed that she had taken the single most powerful action possible to decrease her risk of developing lung cancer when she quit smoking. I counseled her to remain smoke free, and to contact me if she ever had the desire to smoke again so that I can provide resources and tools to help support the effort to remain smoke free. We discussed the time and location of the scan, and that either  Doroteo Glassman RN or I will call with the results within  24-48 hours of receiving them. She has my card and contact information in the event she needs to speak with me, in addition to a copy of the power point we reviewed as a resource. She verbalized understanding of all of the above and had no further questions upon leaving the office.     I explained to the patient that there has been a high incidence of coronary  artery disease noted on these exams. I explained that this is a non-gated exam therefore degree or severity cannot be determined. This patient is  Not on statin therapy. I have asked the patient to follow-up with their PCP regarding any incidental finding of coronary artery disease and management with diet or medication as they feel is clinically indicated. The patient verbalized  understanding of the above and had no further questions.     Magdalen Spatz, NP 01/18/2020 2:09 PM

## 2020-01-21 ENCOUNTER — Other Ambulatory Visit: Payer: Self-pay | Admitting: *Deleted

## 2020-01-21 DIAGNOSIS — Z87891 Personal history of nicotine dependence: Secondary | ICD-10-CM

## 2020-01-21 NOTE — Progress Notes (Signed)
Please call patient and let them  know their  low dose Ct was read as a Lung RADS 2: nodules that are benign in appearance and behavior with a very low likelihood of becoming a clinically active cancer due to size or lack of growth. Recommendation per radiology is for a repeat LDCT in 12 months. .Please let them  know we will order and schedule their  annual screening scan for 01/2021 Please let them  know there was notation of CAD on their  scan.  Please remind the patient  that this is a non-gated exam therefore degree or severity of disease  cannot be determined. Please have them  follow up with their PCP regarding potential risk factor modification, dietary therapy or pharmacologic therapy if clinically indicated. Pt.  is  not currently on statin therapy. Please place order for annual  screening scan for  01/2021 and fax results to PCP. Thanks so much. 

## 2020-04-13 ENCOUNTER — Other Ambulatory Visit: Payer: Self-pay | Admitting: Family Medicine

## 2020-04-13 DIAGNOSIS — Z1231 Encounter for screening mammogram for malignant neoplasm of breast: Secondary | ICD-10-CM

## 2020-05-20 ENCOUNTER — Ambulatory Visit: Payer: Medicare Other

## 2020-06-21 ENCOUNTER — Ambulatory Visit
Admission: RE | Admit: 2020-06-21 | Discharge: 2020-06-21 | Disposition: A | Discharge status: Home / Self Care | Payer: Medicare Other | Source: Ambulatory Visit | Attending: Family Medicine | Admitting: Family Medicine

## 2020-06-21 ENCOUNTER — Other Ambulatory Visit: Payer: Self-pay

## 2020-06-21 DIAGNOSIS — Z1231 Encounter for screening mammogram for malignant neoplasm of breast: Secondary | ICD-10-CM

## 2021-01-20 ENCOUNTER — Ambulatory Visit
Admission: RE | Admit: 2021-01-20 | Discharge: 2021-01-20 | Disposition: A | Discharge status: Home / Self Care | Payer: Medicare Other | Source: Ambulatory Visit | Attending: Acute Care | Admitting: Acute Care

## 2021-01-20 DIAGNOSIS — Z87891 Personal history of nicotine dependence: Secondary | ICD-10-CM

## 2021-01-27 NOTE — Progress Notes (Signed)
Please call patient and let them  know their  low dose Ct was read as a Lung RADS 2: nodules that are benign in appearance and behavior with a very low likelihood of becoming a clinically active cancer due to size or lack of growth. Recommendation per radiology is for a repeat LDCT in 12 months. .Please let them  know we will order and schedule their  annual screening scan for 01/2022. Please let them  know there was notation of CAD on their  scan.  Please remind the patient  that this is a non-gated exam therefore degree or severity of disease  cannot be determined. Please have them  follow up with their PCP regarding potential risk factor modification, dietary therapy or pharmacologic therapy if clinically indicated. Pt.  is not  currently on statin therapy. Please place order for annual  screening scan for  01/2022 and fax results to PCP. Thanks so much.

## 2021-01-30 ENCOUNTER — Other Ambulatory Visit: Payer: Self-pay | Admitting: *Deleted

## 2021-01-30 DIAGNOSIS — Z87891 Personal history of nicotine dependence: Secondary | ICD-10-CM

## 2021-05-16 ENCOUNTER — Other Ambulatory Visit: Payer: Self-pay | Admitting: Family Medicine

## 2021-05-16 DIAGNOSIS — Z1231 Encounter for screening mammogram for malignant neoplasm of breast: Secondary | ICD-10-CM

## 2021-06-22 ENCOUNTER — Ambulatory Visit: Payer: Medicare Other

## 2021-06-26 ENCOUNTER — Ambulatory Visit
Admission: RE | Admit: 2021-06-26 | Discharge: 2021-06-26 | Disposition: A | Discharge status: Home / Self Care | Payer: Medicare Other | Source: Ambulatory Visit | Attending: Family Medicine | Admitting: Family Medicine

## 2021-06-26 ENCOUNTER — Other Ambulatory Visit: Payer: Self-pay

## 2021-06-26 DIAGNOSIS — Z1231 Encounter for screening mammogram for malignant neoplasm of breast: Secondary | ICD-10-CM

## 2021-12-07 ENCOUNTER — Telehealth: Payer: Self-pay | Admitting: Acute Care

## 2021-12-08 NOTE — Telephone Encounter (Signed)
Spoke with pt and clarified appt for lung cancer screening CT scan. Also scheduled pt for f/u with Dr Elsworth Soho. Pt verbalized understanding.  ?

## 2021-12-08 NOTE — Telephone Encounter (Signed)
Left message for pt to call back regarding lung screening CT scan.  ?

## 2022-01-22 ENCOUNTER — Ambulatory Visit
Admission: RE | Admit: 2022-01-22 | Discharge: 2022-01-22 | Disposition: A | Discharge status: Home / Self Care | Payer: Medicare Other | Source: Ambulatory Visit | Attending: Acute Care | Admitting: Acute Care

## 2022-01-22 DIAGNOSIS — Z87891 Personal history of nicotine dependence: Secondary | ICD-10-CM

## 2022-01-24 ENCOUNTER — Other Ambulatory Visit: Payer: Self-pay | Admitting: Acute Care

## 2022-01-24 DIAGNOSIS — Z87891 Personal history of nicotine dependence: Secondary | ICD-10-CM

## 2022-01-24 DIAGNOSIS — Z122 Encounter for screening for malignant neoplasm of respiratory organs: Secondary | ICD-10-CM

## 2022-01-26 ENCOUNTER — Encounter: Payer: Self-pay | Admitting: Pulmonary Disease

## 2022-01-26 ENCOUNTER — Ambulatory Visit: Payer: Medicare Other | Admitting: Pulmonary Disease

## 2022-01-26 DIAGNOSIS — R911 Solitary pulmonary nodule: Secondary | ICD-10-CM | POA: Diagnosis not present

## 2022-01-26 DIAGNOSIS — J432 Centrilobular emphysema: Secondary | ICD-10-CM

## 2022-01-26 MED ORDER — ANORO ELLIPTA 62.5-25 MCG/ACT IN AEPB: 1.0000 | INHALATION_SPRAY | Freq: Every day | RESPIRATORY_TRACT | 0 refills | Status: DC

## 2022-01-26 NOTE — Assessment & Plan Note (Signed)
She was given a sample of Anoro to trial.  She will call us back for prescription of this works. We discussed COPD action plan. We discussed signs and symptoms of exacerbation.  She will use albuterol for rescue

## 2022-01-26 NOTE — Assessment & Plan Note (Signed)
She will continue annual low-dose CT screening. Results of current CT were discussed in detail

## 2022-01-26 NOTE — Patient Instructions (Addendum)
  Try mucinex  600 mg as needed  X trial of Anoro / Trelegy- sample

## 2022-01-26 NOTE — Progress Notes (Signed)
   Subjective:    Patient ID: Rachel Mcbride, female    DOB: 01/18/1946, 76 y.o.   MRN: [id number]  HPI  76 yo ex- heavy smoker > 50 Pyrs, for FU of COPD & subcarinal lymphadenopathy.  Breast cancer treated in 2011 with mastectomy, chemoradiation   02/2011 quit smoking    Chief Complaint  Patient presents with   Follow-up    Did not try Anoro. No changes in energy.  Coughing, wheezing and SOB increased.   She is seeing me after 3 years, last seen in our office 11/2019 by APP. She quit smoking in 2012 but is still using e-cigarettes. Weight is stable at 100 pounds. She reports increased kyphosis.  She now sleeps in a recliner. Has predominant early morning cough with initially yellow but then clear sputum  We reviewed PFTs from the past and low-dose CT chest She reports shortness of breath on daily activities   Significant tests/ events reviewed  01/2012 LDCT chest >> RUL consolidation >> radiation fibrosis New RUL nodule 02/2011 -resolved in 08/2012.  CT chest 09/2016  was reviewed which shows stable biapical scarring, subcarinal and hilar calcified lymphadenopathy and stable nodules     Spirometry 12/2017  FEV1 of 42%, ratio 46, FVC of 68% Spirometry 11/2016  FEV1 dropped to 35% 07/2010 Spirometry FEV1 at 55%, ratio at 74.   12/2013 FEV1 49%     Review of Systems neg for any significant sore throat, dysphagia, itching, sneezing, nasal congestion or excess/ purulent secretions, fever, chills, sweats, unintended wt loss, pleuritic or exertional cp, hempoptysis, orthopnea pnd or change in chronic leg swelling. Also denies presyncope, palpitations, heartburn, abdominal pain, nausea, vomiting, diarrhea or change in bowel or urinary habits, dysuria,hematuria, rash, arthralgias, visual complaints, headache, numbness weakness or ataxia.     Objective:   Physical Exam  Gen. Pleasant, elderly, well-nourished, in no distress ENT - no thrush, no pallor/icterus,no post nasal  drip Neck: No JVD, no thyromegaly, no carotid bruits Lungs: Kyphosis, no use of accessory muscles, no dullness to percussion, clear without rales or rhonchi  Cardiovascular: Rhythm regular, heart sounds  normal, no murmurs or gallops, no peripheral edema Musculoskeletal: No deformities, no cyanosis or clubbing        Assessment & Plan:

## 2022-05-18 ENCOUNTER — Other Ambulatory Visit: Payer: Self-pay | Admitting: Family Medicine

## 2022-05-18 DIAGNOSIS — Z1231 Encounter for screening mammogram for malignant neoplasm of breast: Secondary | ICD-10-CM

## 2022-06-28 ENCOUNTER — Ambulatory Visit: Payer: Medicare Other

## 2022-07-25 ENCOUNTER — Encounter: Payer: Self-pay | Admitting: Adult Health

## 2022-07-25 ENCOUNTER — Ambulatory Visit: Payer: Medicare Other | Admitting: Adult Health

## 2022-07-25 VITALS — BP 130/60 | HR 94 | Temp 98.3°F | Ht <= 58 in | Wt 99.4 lb

## 2022-07-25 DIAGNOSIS — J432 Centrilobular emphysema: Secondary | ICD-10-CM

## 2022-07-25 NOTE — Progress Notes (Signed)
$'@Patient'w$  ID: Rachel Mcbride, female    DOB: 05/17/1946, 76 y.o.   MRN: [id number]  Chief Complaint  Patient presents with   Follow-up    Referring provider: Leonard Downing, *  HPI: 76 year old female former smoker followed for COPD and subcarinal lymphadenopathy Medical history significant for breast cancer treated in 2011  TEST/EVENTS :  01/2012 LDCT chest >> RUL consolidation >> radiation fibrosis New RUL nodule 02/2011 -resolved in 08/2012.  CT chest 09/2016  was reviewed which shows stable biapical scarring, subcarinal and hilar calcified lymphadenopathy and stable nodules  Low-dose CT chest January 22, 2022 severe emphysema, volume loss right upper lung unchanged compatible with radiation fibrosis, lung RADS 2, coarsely calcified nonenlarged subcarinal and left hilar nodes unchanged and compatible with prior granulomatous disease     Spirometry 12/2017  FEV1 of 42%, ratio 46, FVC of 68% Spirometry 11/2016  FEV1 dropped to 35% 07/2010 Spirometry FEV1 at 55%, ratio at 74.   12/2013 FEV1 49%  07/25/2022 Follow up : COPD Patient returns for 5-monthfollow-up.  Patient has underlying severe COPD with emphysema.  Patient is previously been on Anoro but says that she has no perceived benefit. Given Trelegy sample last visit but did not take. We discussed the purpose of the medication and potential benefit.  Does not want to take a inhaler every day.  She says overall breathing is doing okay.  She gets short of breath with heavy activity.  She denies any flare of cough or wheezing.  Rarely uses albuterol. Not on oxygen at home. Today O2 sats 100%. Very sedentary , can do shopping once weekly . Does ADLS and light house chores. Still Drives.  Flu shot up to date, covid booster -up to date . Can't take PVX , previous reaction .    Allergies  Allergen Reactions   Pneumococcal Vaccines Itching   Shellfish-Derived Products Itching    Immunization History  Administered Date(s)  Administered   Influenza Split 05/14/2011, 05/13/2013   Influenza Whole 05/16/2009, 05/01/2010, 05/13/2012   Influenza, High Dose Seasonal PF 05/22/2017, 05/12/2018   Pneumococcal Polysaccharide-23 03/03/2010   Td 03/03/2010    Past Medical History:  Diagnosis Date   Anemia    Atrial flutter ([facility] 2011   occurred with CHF; no current dysrhythmia   Breast cancer ([facility] 04/2010   Cataract    bilateral   CHF (congestive heart failure) ([facility] 2011   COPD (chronic obstructive pulmonary disease) ([facility]    denies SOB with daily activities   Cough 08/31/2011   Cough, persistent 09/21/2015   Dysuria 11/22/2014   Hypertension    under control; has been on med. x 2 yrs.   Lung nodule seen on imaging study 09/21/2015   Neuropathy of leg    since chemo; right leg   Osteopenia 12/31/2011   Personal history of chemotherapy    Personal history of radiation therapy    Pneumonia 2011   Scoliosis    since chemo   Weight loss 09/21/2015    Tobacco History: Social History   Tobacco Use  Smoking Status Former   Packs/day: 2.00   Years: 50.00   Total pack years: 100.00   Types: Cigarettes   Quit date: 07/14/2010   Years since quitting: 12.0  Smokeless Tobacco Never  Tobacco Comments   uses electronic cigarette 18 grams nicotine, no menthol   Counseling given: Not Answered Tobacco comments: uses electronic cigarette 18 grams nicotine, no menthol   Outpatient Medications Prior to Visit  Medication Sig Dispense Refill   Calcium Carbonate (CALCIUM 600 PO) Take 2 capsules by mouth daily.      Cholecalciferol (VITAMIN D PO) Take 1,000 Units by mouth daily.     ferrous sulfate 325 (65 FE) MG tablet Take 325 mg by mouth 2 (two) times daily with a meal.     levothyroxine (SYNTHROID, LEVOTHROID) 75 MCG tablet Take 75 mcg by mouth daily.     albuterol (VENTOLIN HFA) 108 (90 Base) MCG/ACT inhaler Inhale 2 puffs into the lungs every 6 (six) hours as needed for wheezing or shortness of breath. (Patient not  taking: Reported on 01/26/2022) 1 Inhaler 2   umeclidinium-vilanterol (ANORO ELLIPTA) 62.5-25 MCG/ACT AEPB Inhale 1 puff into the lungs daily. (Patient not taking: Reported on 07/25/2022) 1 each 0   alendronate (FOSAMAX) 35 MG tablet TAKE 1 TABLET EVERY WEEK (Patient not taking: Reported on 07/25/2022) 12 tablet 3   No facility-administered medications prior to visit.     Review of Systems:   Constitutional:   No  weight loss, night sweats,  Fevers, chills, + fatigue, or  lassitude.  HEENT:   No headaches,  Difficulty swallowing,  Tooth/dental problems, or  Sore throat,                No sneezing, itching, ear ache, nasal congestion, post nasal drip,   CV:  No chest pain,  Orthopnea, PND, swelling in lower extremities, anasarca, dizziness, palpitations, syncope.   GI  No heartburn, indigestion, abdominal pain, nausea, vomiting, diarrhea, change in bowel habits, loss of appetite, bloody stools.   Resp: .  No chest wall deformity  Skin: no rash or lesions.  GU: no dysuria, change in color of urine, no urgency or frequency.  No flank pain, no hematuria   MS:  No joint pain or swelling.  No decreased range of motion.  No back pain.    Physical Exam    GEN: A/Ox3; pleasant , NAD, well nourished    HEENT:  [place]/AT,  EACs-clear, TMs-wnl, NOSE-clear, THROAT-clear, no lesions, no postnasal drip or exudate noted.   NECK:  Supple w/ fair ROM; no JVD; normal carotid impulses w/o bruits; no thyromegaly or nodules palpated; no lymphadenopathy.    RESP  Clear  P & A; w/o, wheezes/ rales/ or rhonchi. no accessory muscle use, no dullness to percussion  CARD:  RRR, no m/r/g, no peripheral edema, pulses intact, no cyanosis or clubbing.  GI:   Soft & nt; nml bowel sounds; no organomegaly or masses detected.   Musco: Warm bil, no deformities or joint swelling noted.   Neuro: alert, no focal deficits noted.    Skin: Warm, no lesions or rashes    Lab Results:  CBC   BMET   BNP No  results found for: "BNP"  ProBNP   Imaging: No results found.        No data to display          No results found for: "NITRICOXIDE"      Assessment & Plan:   No problem-specific Assessment & Plan notes found for this encounter.     Rexene Edison, NP 07/25/2022

## 2022-07-25 NOTE — Patient Instructions (Addendum)
Try Trelegy inhaler 1 puff daily , rinse after use.  Albuterol inhaler As needed   Consider RSV vaccine next month.  Activity as tolerated.  Follow up with Dr. Elsworth Soho  In 6 months and As needed

## 2022-07-26 NOTE — Assessment & Plan Note (Signed)
Patient has underlying severe COPD with emphysema.  We discussed using a maintenance inhaler to manage her symptoms patient says she will try Trelegy she has the inhaler at home.  Plan  Patient Instructions  Try Trelegy inhaler 1 puff daily , rinse after use.  Albuterol inhaler As needed   Consider RSV vaccine next month.  Activity as tolerated.  Follow up with Dr. Elsworth Soho  In 6 months and As needed

## 2022-07-30 ENCOUNTER — Telehealth: Payer: Self-pay | Admitting: Adult Health

## 2022-07-30 MED ORDER — ANORO ELLIPTA 62.5-25 MCG/ACT IN AEPB: 1.0000 | INHALATION_SPRAY | Freq: Every day | RESPIRATORY_TRACT | 2 refills | Status: DC

## 2022-07-30 NOTE — Telephone Encounter (Signed)
Spoke to patient and she states she had 3 days left of her Anoro sample and wanted a refill sent in to the pharmacy for her. [facility] [place] [facility] patient. Nothing further at this time.

## 2022-08-30 ENCOUNTER — Ambulatory Visit
Admission: RE | Admit: 2022-08-30 | Discharge: 2022-08-30 | Disposition: A | Discharge status: Home / Self Care | Payer: Medicare Other | Source: Ambulatory Visit | Attending: Family Medicine | Admitting: Family Medicine

## 2022-08-30 DIAGNOSIS — Z1231 Encounter for screening mammogram for malignant neoplasm of breast: Secondary | ICD-10-CM

## 2022-10-25 ENCOUNTER — Other Ambulatory Visit: Payer: Self-pay | Admitting: Pulmonary Disease

## 2023-01-07 ENCOUNTER — Other Ambulatory Visit: Payer: Self-pay | Admitting: Acute Care

## 2023-01-07 DIAGNOSIS — Z122 Encounter for screening for malignant neoplasm of respiratory organs: Secondary | ICD-10-CM

## 2023-01-07 DIAGNOSIS — Z87891 Personal history of nicotine dependence: Secondary | ICD-10-CM

## 2023-01-09 ENCOUNTER — Encounter: Payer: Self-pay | Admitting: Gastroenterology

## 2023-01-14 ENCOUNTER — Encounter: Payer: Self-pay | Admitting: Gastroenterology

## 2023-01-23 ENCOUNTER — Other Ambulatory Visit: Payer: Self-pay | Admitting: Pulmonary Disease

## 2023-01-24 ENCOUNTER — Telehealth (HOSPITAL_BASED_OUTPATIENT_CLINIC_OR_DEPARTMENT_OTHER): Payer: Self-pay | Admitting: Pulmonary Disease

## 2023-01-24 NOTE — Telephone Encounter (Signed)
Patient wanted to know if refill of An[facility] was sent to [facility] in [place]. Advise patient to call pharmacy and she has tried getting in touch with them but has been unsuccessful. Patient was just like to confirm that it has been sent before she goes in person. Please advise and call patient back.

## 2023-01-28 ENCOUNTER — Telehealth: Payer: Self-pay | Admitting: Acute Care

## 2023-01-28 NOTE — Telephone Encounter (Signed)
Called and left VM for pt. Per Iris Pert, [facility], pt's LDCT scheduled for 6/18 ia not covered due to pt not meeting the CMS guidelines. Pt is 77 years old and coverage for LDCT stops at 78 yo. Will forward to the lung nodule pool to try her again today.

## 2023-01-28 NOTE — Telephone Encounter (Signed)
Called and spoke to pt. She is aware the insurance will not cover pt's LDCT. Appt has been cancelled. Pt aware she can speak with PCP about future scans. Nothing further needed at this time.

## 2023-01-29 ENCOUNTER — Inpatient Hospital Stay: Admission: RE | Admit: 2023-01-29 | Payer: Medicare Other | Source: Ambulatory Visit

## 2023-01-30 NOTE — Telephone Encounter (Signed)
Called pt and there was no answer- line rings and then goes to busy tone  Her rx was sent to pharmacy on 01/25/23

## 2023-01-30 NOTE — Telephone Encounter (Signed)
Called and spoke with patient. Patient just picked up Rx today. Nothing further needed.

## 2023-04-03 ENCOUNTER — Ambulatory Visit (HOSPITAL_BASED_OUTPATIENT_CLINIC_OR_DEPARTMENT_OTHER): Payer: Medicare Other | Admitting: Pulmonary Disease

## 2023-04-03 ENCOUNTER — Encounter (HOSPITAL_BASED_OUTPATIENT_CLINIC_OR_DEPARTMENT_OTHER): Payer: Self-pay | Admitting: Pulmonary Disease

## 2023-04-03 VITALS — BP 124/52 | HR 70 | Resp 16 | Ht <= 58 in | Wt 101.6 lb

## 2023-04-03 DIAGNOSIS — J432 Centrilobular emphysema: Secondary | ICD-10-CM

## 2023-04-03 MED ORDER — ALBUTEROL SULFATE HFA 108 (90 BASE) MCG/ACT IN AERS: 2.0000 | INHALATION_SPRAY | Freq: Four times a day (QID) | RESPIRATORY_TRACT | 6 refills | Status: DC | PRN

## 2023-04-03 NOTE — Assessment & Plan Note (Signed)
This is a stable interval for her.  She is compliant with Anoro now.  Previously had given her triple therapy with Trelegy but she did not like this and prefers to be on Anoro.  She does not take currently to change. We will provide her refills on albuterol as needed. We discussed COPD action plan and signs and symptoms of COPD exacerbation.  She would benefit from pulmonary rehab but does not want to

## 2023-04-03 NOTE — Patient Instructions (Signed)
x refills x 5 on albuterol MDI 2 puffs every 6 hours as needed

## 2023-04-03 NOTE — Progress Notes (Signed)
   Subjective:    Patient ID: Rachel Mcbride, female    DOB: 1946/01/21, 77 y.o.   MRN: [id number]  HPI  77  yo ex- heavy smoker for FU of COPD & subcarinal lymphadenopathy.  PMH : Breast cancer treated in 2011 with mastectomy, chemoradiation   02/2011 quit smoking, > 50 Pyrs,   Significant tests/ events reviewed   01/2012 LDCT chest >> RUL consolidation >> radiation fibrosis New RUL nodule 02/2011 -resolved in 08/2012.    LDCT chest 01/2022 >> severe emphysema, volume loss right upper lung unchanged compatible with radiation fibrosis, lung RADS 2, coarsely calcified nonenlarged subcarinal and left hilar nodes unchanged and compatible with prior granulomatous disease      Spirometry 12/2017  FEV1 of 42%, ratio 46, FVC of 68% Spirometry 11/2016  FEV1 dropped to 35% 07/2010 Spirometry FEV1 at 55%, ratio at 74.   12/2013 FEV1 49%  Review of Systems neg for any significant sore throat, dysphagia, itching, sneezing, nasal congestion or excess/ purulent secretions, fever, chills, sweats, unintended wt loss, pleuritic or exertional cp, hempoptysis, orthopnea pnd or change in chronic leg swelling. Also denies presyncope, palpitations, heartburn, abdominal pain, nausea, vomiting, diarrhea or change in bowel or urinary habits, dysuria,hematuria, rash, arthralgias, visual complaints, headache, numbness weakness or ataxia.       Objective:   Physical Exam  Gen. Pleasant, kyphotic, elderly, in no distress ENT - no lesions, no post nasal drip Neck: No JVD, no thyromegaly, no carotid bruits Lungs: no use of accessory muscles, no dullness to percussion, decreased without rales or rhonchi  Cardiovascular: Rhythm regular, heart sounds  normal, no murmurs or gallops, no peripheral edema Musculoskeletal: No deformities, no cyanosis or clubbing , no tremors       Assessment & Plan:

## 2023-04-25 ENCOUNTER — Other Ambulatory Visit: Payer: Self-pay | Admitting: Pulmonary Disease

## 2023-05-24 ENCOUNTER — Ambulatory Visit: Payer: Medicare Other | Admitting: Gastroenterology

## 2023-05-28 ENCOUNTER — Encounter: Payer: Self-pay | Admitting: Gastroenterology

## 2023-05-29 ENCOUNTER — Ambulatory Visit: Payer: Medicare Other | Admitting: Gastroenterology

## 2023-05-29 ENCOUNTER — Encounter: Payer: Self-pay | Admitting: Gastroenterology

## 2023-05-29 VITALS — BP 126/68 | HR 50 | Ht 60.0 in | Wt 100.4 lb

## 2023-05-29 DIAGNOSIS — Z1211 Encounter for screening for malignant neoplasm of colon: Secondary | ICD-10-CM

## 2023-05-29 DIAGNOSIS — R634 Abnormal weight loss: Secondary | ICD-10-CM | POA: Diagnosis not present

## 2023-05-29 DIAGNOSIS — K5900 Constipation, unspecified: Secondary | ICD-10-CM

## 2023-05-29 NOTE — Progress Notes (Signed)
Assessment    CRC screening, average risk  husband Constipation, intermittent  Weight loss, likely d/t insufficient nutrition intake Severe COPD History of breast cancer   Recommendations   After discussing she decided that she does not want to have another screening colonoscopy Fiber supplement qam, increase daily water intake Increase Boost to bid to tid, encouraged a balanced diet Follow up with Dr. Jeannetta Nap to monitor weight, nutrition GI follow up prn   HPI   Chief complaint: CRC screening   Patient profile:  Rachel Mcbride is a 77 y.o. female referred by Kaleen Mask, *MD for consideration of CRC screening. She is accompanied by her husband.  She has intermittent mild constipation that has not changed in several years.  She notes a decreased interest in eating and has had a very slow steady weight loss that correlates with her decrease in food intake.  Colonoscopy in July 2014 was without polyps -see below.  Denies abdominal pain, diarrhea, change in stool caliber, melena, hematochezia, nausea, vomiting, dysphagia, reflux symptoms, chest pain.   Previous Labs / Imaging::    Latest Ref Rng & Units 04/05/2016    9:14 AM 09/21/2015   11:05 AM 11/22/2014   10:05 AM  CBC  WBC 3.9 - 10.3 10e3/uL 4.0  4.0  5.1   Hemoglobin 11.6 - 15.9 g/dL 62.1  30.8  65.7   Hematocrit 34.8 - 46.6 % 35.4  34.4  34.6   Platelets 145 - 400 10e3/uL 205  210  205     No results found for: "LIPASE"    Latest Ref Rng & Units 11/22/2014   10:05 AM 03/22/2014   12:27 PM 12/25/2013    2:20 PM  CMP  Glucose 70 - 140 mg/dl 74  69  94   BUN 7.0 - 26.0 mg/dL 84.6  18  96.2   Creatinine 0.6 - 1.1 mg/dL 0.9  9.52  0.8   Sodium 136 - 145 mEq/L 141  136  138   Potassium 3.5 - 5.1 mEq/L 4.3  4.8  4.4   Chloride 96 - 112 mEq/L  99    CO2 22 - 29 mEq/L 28  31  24    Calcium 8.4 - 10.4 mg/dL 84.1  9.7  32.4   Total Protein 6.4 - 8.3 g/dL 7.4  6.7  6.9   Total Bilirubin 0.20 - 1.20 mg/dL  4.01  0.4  0.27   Alkaline Phos 40 - 150 U/L 47  48  48   AST 5 - 34 U/L 19  18  16    ALT 0 - 55 U/L 8  <8  8      Previous GI evaluation    Endoscopies:  Colonoscopy July 2014 - Mild sigmoid colon diverticulosis - Moderate sized internal hemorrhoids  Imaging:     Past Medical History:  Diagnosis Date   Anemia    Atrial flutter (HCC) 2011   occurred with CHF; no current dysrhythmia   Breast cancer (HCC) 04/2010   Cataract    bilateral   CHF (congestive heart failure) (HCC) 2011   COPD (chronic obstructive pulmonary disease) (HCC)    denies SOB with daily activities   Cough 08/31/2011   Cough, persistent 09/21/2015   Dysuria 11/22/2014   Hypertension    under control; has been on med. x 2 yrs.   Lung nodule seen on imaging study 09/21/2015   Neuropathy of leg    since chemo; right leg   Osteopenia 12/31/2011  Personal history of chemotherapy    Personal history of radiation therapy    Pneumonia 2011   Scoliosis    since chemo   Weight loss 09/21/2015   Past Surgical History:  Procedure Laterality Date   BRONCHOSCOPY  04/03/2010   with endobronchial ultrasound   MASTECTOMY Right 04/20/2010   right   PORT-A-CATH REMOVAL  09/05/2011   Procedure: REMOVAL PORT-A-CATH;  Surgeon: Emelia Loron, MD;  Location: [facility];  Service: General;  Laterality: Left;   PORTACATH PLACEMENT  05/17/2010   Family History  Problem Relation Age of Onset   Heart attack Mother    Stroke Mother    Heart disease Sister    Cancer Sister        ? mouth   Breast cancer Neg Hx    Colon cancer Neg Hx    Stomach cancer Neg Hx    Esophageal cancer Neg Hx    Social History   Tobacco Use   Smoking status: Former    Current packs/day: 0.00    Average packs/day: 2.0 packs/day for 50.0 years (100.0 ttl pk-yrs)    Types: Cigarettes    Start date: 07/14/1960    Quit date: 07/14/2010    Years since quitting: 12.8   Smokeless tobacco: Never   Tobacco comments:    uses  electronic cigarette 18 grams nicotine, no menthol  Vaping Use   Vaping status: Every Day   Start date: 07/14/2010  Substance Use Topics   Alcohol use: Never    Alcohol/week: 12.0 standard drinks of alcohol    Types: 12 Cans of beer per week    Comment: 2-3 x/week   Drug use: Never    Types: Methylphenidate   Current Outpatient Medications  Medication Sig Dispense Refill   albuterol (VENTOLIN HFA) 108 (90 Base) MCG/ACT inhaler Inhale 2 puffs into the lungs every 6 (six) hours as needed for wheezing or shortness of breath. 1 each 6   Calcium Carbonate (CALCIUM 600 PO) Take 2 capsules by mouth daily.      Cholecalciferol (VITAMIN D PO) Take 1,000 Units by mouth daily.     ferrous sulfate 325 (65 FE) MG tablet Take 325 mg by mouth 2 (two) times daily with a meal.     levothyroxine (SYNTHROID, LEVOTHROID) 75 MCG tablet Take 75 mcg by mouth daily.     umeclidinium-vilanterol (ANORO ELLIPTA) 62.5-25 MCG/ACT AEPB INHALE 1 PUFF BY MOUTH EVERY DAY 60 each 11   No current facility-administered medications for this visit.   Allergies  Allergen Reactions   Pneumococcal Vaccines Itching   Shellfish-Derived Products Itching    Review of Systems: All other systems reviewed and negative except where noted in HPI.    Physical Exam    Wt Readings from Last 3 Encounters:  05/29/23 100 lb 6.4 oz (45.5 kg)  04/03/23 101 lb 9.6 oz (46.1 kg)  07/25/22 99 lb 6.4 oz (45.1 kg)    BP 126/68   Pulse (!) 50   Ht 5' (1.524 m)   Wt 100 lb 6.4 oz (45.5 kg)   SpO2 95%   BMI 19.61 kg/m  Constitutional:  Generally well appearing female, elderly, kyphotic, thin, in no acute distress. HEENT: Pupils normal.  Conjunctivae are normal. No scleral icterus. No oral lesions or deformities noted.  Neck: Supple.  Cardiac: Normal rate, regular rhythm without murmurs. Pulmonary/chest: Effort normal and decreased breath sounds. No wheezing, rales or rhonchi. Abdominal: Soft, nondistended, nontender. Active bowel  sounds. No palpable HSM,  masses or hernias. Rectal: Not done Extremities: No edema or deformities noted Neurological: Alert and oriented to person, place and time. Psychiatric: Pleasant. Normal mood and affect. Behavior is normal. Skin: Skin is warm and dry. No rashes noted.  Claudette Head, MD   cc:  Referring Provider Kaleen Mask, *

## 2023-05-29 NOTE — Patient Instructions (Addendum)
Start fiber supplement daily.  Increase your Boost to 2-3 drinks a day.  We have cancelled your colonoscopy recalls.  The [facility] providers would like to encourage you to use [facility] to communicate with providers for non-urgent requests or questions.  Due to long hold times on the telephone, sending your provider a message by [facility] may be a faster and more efficient way to get a response.  Please allow 48 business hours for a response.  Please remember that this is for non-urgent requests.   Thank you for choosing me and [facility].  Venita Lick. Pleas Koch., MD., Clementeen Graham

## 2023-07-24 ENCOUNTER — Other Ambulatory Visit: Payer: Self-pay | Admitting: Family Medicine

## 2023-07-24 DIAGNOSIS — Z1231 Encounter for screening mammogram for malignant neoplasm of breast: Secondary | ICD-10-CM

## 2023-09-05 ENCOUNTER — Ambulatory Visit: Payer: Medicare Other

## 2023-09-25 ENCOUNTER — Ambulatory Visit (HOSPITAL_BASED_OUTPATIENT_CLINIC_OR_DEPARTMENT_OTHER): Payer: Medicare Other | Admitting: Pulmonary Disease

## 2023-09-25 ENCOUNTER — Encounter (HOSPITAL_BASED_OUTPATIENT_CLINIC_OR_DEPARTMENT_OTHER): Payer: Self-pay | Admitting: Pulmonary Disease

## 2023-09-25 VITALS — BP 138/62 | HR 91 | Ht 60.0 in | Wt 102.0 lb

## 2023-09-25 DIAGNOSIS — J432 Centrilobular emphysema: Secondary | ICD-10-CM

## 2023-09-25 NOTE — Patient Instructions (Signed)
Refills on anoro as needed

## 2023-09-25 NOTE — Progress Notes (Signed)
   Subjective:    Patient ID: Rachel Mcbride, female    DOB: 1946/01/01, 78 y.o.   MRN: [id number]  HPI  78  yo ex- heavy smoker for FU of COPD & subcarinal lymphadenopathy.  PMH : Breast cancer treated in 2011 with mastectomy, chemoradiation   02/2011 quit smoking, > 50 Pyrs,  The patient, with a history of COPD, presents for a routine follow-up. She reports decreased activity levels due to issues with her legs and neuropathy. She also mentions problems with scoliosis of the spine. Despite these issues, she maintains a regular routine of taking her Anoro inhaler first thing in the morning, followed by her thyroid pill and then breakfast. She reports feeling full after breakfast and does not engage in much physical activity, except for weekly shopping trips where she walks a lot. She also mentions feeling tired a lot and has concerns about potential dementia. She has been losing weight and has a decreased appetite.     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      LDCT chest 01/2022 >> severe emphysema, volume loss right upper lung unchanged compatible with radiation fibrosis, lung RADS 2, coarsely calcified nonenlarged subcarinal and left hilar nodes unchanged and compatible with prior granulomatous disease      Spirometry 12/2017  FEV1 of 42%, ratio 46, FVC of 68% Spirometry 11/2016  FEV1 dropped to 35% 07/2010 Spirometry FEV1 at 55%, ratio at 74.   12/2013 FEV1 49%  Review of Systems neg for any significant sore throat, dysphagia, itching, sneezing, nasal congestion or excess/ purulent secretions, fever, chills, sweats, unintended wt loss, pleuritic or exertional cp, hempoptysis, orthopnea pnd or change in chronic leg swelling. Also denies presyncope, palpitations, heartburn, abdominal pain, nausea, vomiting, diarrhea or change in bowel or urinary habits, dysuria,hematuria, rash,  arthralgias, visual complaints, headache, numbness weakness or ataxia.     Objective:   Physical Exam  Gen. Pleasant, well-nourished, in no distress ENT - no thrush, no pallor/icterus,no post nasal drip Neck: No JVD, no thyromegaly, no carotid bruits Lungs: no use of accessory muscles, no dullness to percussion, clear without rales or rhonchi  Cardiovascular: Rhythm regular, heart sounds  normal, no murmurs or gallops, no peripheral edema Musculoskeletal: No deformities, no cyanosis or clubbing        Assessment & Plan:   Chronic Obstructive Pulmonary Disease (COPD) Follow-up for COPD. Breathing is well-managed with Anoro inhaler, taken daily. No longer requires albuterol and requests removal from auto-refill. Decreased activity due to leg and foot problems, and scoliosis. Declined previous rehab programs due to logistical issues and lack of motivation. Discussed benefits of increased physical activity for energy and overall health. - Continue Anoro inhaler daily - Remove albuterol from auto-refill - Encourage increased physical activity as tolerated, does not want to engage with pulm rehab - Follow up in one year unless symptoms worsen   General Health Maintenance Generally well, follows a routine including medications and a balanced diet with fruit slushies and Boost for protein. Supportive social network and engages in social activities during weekly shopping trips. - Continue current medication regimen - Encourage balanced diet and regular physical activity - Follow up in one year unless symptoms worsen  Follow-up - Schedule follow-up appointment in one year - Advise to contact the office if experiencing a bad cold or any worsening of symptoms.

## 2023-10-08 ENCOUNTER — Ambulatory Visit
Admission: RE | Admit: 2023-10-08 | Discharge: 2023-10-08 | Disposition: A | Discharge status: Home / Self Care | Payer: Medicare Other | Source: Ambulatory Visit | Attending: Family Medicine | Admitting: Family Medicine

## 2023-10-08 DIAGNOSIS — Z1231 Encounter for screening mammogram for malignant neoplasm of breast: Secondary | ICD-10-CM

## 2024-04-13 ENCOUNTER — Other Ambulatory Visit: Payer: Self-pay | Admitting: Pulmonary Disease

## 2024-07-29 ENCOUNTER — Other Ambulatory Visit (HOSPITAL_COMMUNITY): Payer: Self-pay

## 2024-07-29 ENCOUNTER — Other Ambulatory Visit: Payer: Self-pay

## 2024-07-29 ENCOUNTER — Emergency Department

## 2024-07-29 ENCOUNTER — Encounter (HOSPITAL_COMMUNITY): Payer: Self-pay

## 2024-07-29 ENCOUNTER — Inpatient Hospital Stay (HOSPITAL_COMMUNITY)
Admission: EM | Admit: 2024-07-29 | Discharge: 2024-07-30 | DRG: 871 | Disposition: A | Discharge status: Hospice | Attending: Internal Medicine | Admitting: Internal Medicine

## 2024-07-29 DIAGNOSIS — Z823 Family history of stroke: Secondary | ICD-10-CM

## 2024-07-29 DIAGNOSIS — Z8701 Personal history of pneumonia (recurrent): Secondary | ICD-10-CM

## 2024-07-29 DIAGNOSIS — Z923 Personal history of irradiation: Secondary | ICD-10-CM

## 2024-07-29 DIAGNOSIS — Z79899 Other long term (current) drug therapy: Secondary | ICD-10-CM

## 2024-07-29 DIAGNOSIS — Z515 Encounter for palliative care: Secondary | ICD-10-CM | POA: Diagnosis not present

## 2024-07-29 DIAGNOSIS — Z91013 Allergy to seafood: Secondary | ICD-10-CM

## 2024-07-29 DIAGNOSIS — R230 Cyanosis: Secondary | ICD-10-CM | POA: Diagnosis present

## 2024-07-29 DIAGNOSIS — R64 Cachexia: Secondary | ICD-10-CM | POA: Diagnosis present

## 2024-07-29 DIAGNOSIS — I509 Heart failure, unspecified: Secondary | ICD-10-CM | POA: Diagnosis not present

## 2024-07-29 DIAGNOSIS — T68XXXA Hypothermia, initial encounter: Secondary | ICD-10-CM | POA: Diagnosis present

## 2024-07-29 DIAGNOSIS — Z681 Body mass index (BMI) 19 or less, adult: Secondary | ICD-10-CM

## 2024-07-29 DIAGNOSIS — I110 Hypertensive heart disease with heart failure: Secondary | ICD-10-CM | POA: Diagnosis present

## 2024-07-29 DIAGNOSIS — I5022 Chronic systolic (congestive) heart failure: Secondary | ICD-10-CM | POA: Diagnosis present

## 2024-07-29 DIAGNOSIS — C801 Malignant (primary) neoplasm, unspecified: Secondary | ICD-10-CM | POA: Diagnosis present

## 2024-07-29 DIAGNOSIS — M419 Scoliosis, unspecified: Secondary | ICD-10-CM | POA: Diagnosis present

## 2024-07-29 DIAGNOSIS — K7200 Acute and subacute hepatic failure without coma: Secondary | ICD-10-CM | POA: Diagnosis present

## 2024-07-29 DIAGNOSIS — D696 Thrombocytopenia, unspecified: Secondary | ICD-10-CM | POA: Diagnosis present

## 2024-07-29 DIAGNOSIS — Z887 Allergy status to serum and vaccine status: Secondary | ICD-10-CM

## 2024-07-29 DIAGNOSIS — I429 Cardiomyopathy, unspecified: Secondary | ICD-10-CM | POA: Diagnosis not present

## 2024-07-29 DIAGNOSIS — Z9011 Acquired absence of right breast and nipple: Secondary | ICD-10-CM | POA: Diagnosis not present

## 2024-07-29 DIAGNOSIS — E43 Unspecified severe protein-calorie malnutrition: Secondary | ICD-10-CM | POA: Diagnosis present

## 2024-07-29 DIAGNOSIS — E162 Hypoglycemia, unspecified: Secondary | ICD-10-CM | POA: Diagnosis present

## 2024-07-29 DIAGNOSIS — Z9221 Personal history of antineoplastic chemotherapy: Secondary | ICD-10-CM

## 2024-07-29 DIAGNOSIS — K831 Obstruction of bile duct: Secondary | ICD-10-CM | POA: Diagnosis present

## 2024-07-29 DIAGNOSIS — M8580 Other specified disorders of bone density and structure, unspecified site: Secondary | ICD-10-CM | POA: Diagnosis present

## 2024-07-29 DIAGNOSIS — J449 Chronic obstructive pulmonary disease, unspecified: Secondary | ICD-10-CM | POA: Diagnosis present

## 2024-07-29 DIAGNOSIS — K8309 Other cholangitis: Secondary | ICD-10-CM | POA: Diagnosis present

## 2024-07-29 DIAGNOSIS — Z79890 Hormone replacement therapy (postmenopausal): Secondary | ICD-10-CM

## 2024-07-29 DIAGNOSIS — A419 Sepsis, unspecified organism: Principal | ICD-10-CM | POA: Diagnosis present

## 2024-07-29 DIAGNOSIS — Z66 Do not resuscitate: Secondary | ICD-10-CM | POA: Diagnosis present

## 2024-07-29 DIAGNOSIS — C787 Secondary malignant neoplasm of liver and intrahepatic bile duct: Secondary | ICD-10-CM | POA: Diagnosis present

## 2024-07-29 DIAGNOSIS — Z711 Person with feared health complaint in whom no diagnosis is made: Secondary | ICD-10-CM | POA: Diagnosis not present

## 2024-07-29 DIAGNOSIS — K8689 Other specified diseases of pancreas: Secondary | ICD-10-CM | POA: Diagnosis present

## 2024-07-29 DIAGNOSIS — E039 Hypothyroidism, unspecified: Secondary | ICD-10-CM | POA: Diagnosis present

## 2024-07-29 DIAGNOSIS — C259 Malignant neoplasm of pancreas, unspecified: Secondary | ICD-10-CM | POA: Diagnosis present

## 2024-07-29 DIAGNOSIS — R54 Age-related physical debility: Secondary | ICD-10-CM | POA: Diagnosis present

## 2024-07-29 DIAGNOSIS — Z8249 Family history of ischemic heart disease and other diseases of the circulatory system: Secondary | ICD-10-CM | POA: Diagnosis not present

## 2024-07-29 DIAGNOSIS — Z7189 Other specified counseling: Secondary | ICD-10-CM

## 2024-07-29 DIAGNOSIS — J432 Centrilobular emphysema: Secondary | ICD-10-CM | POA: Diagnosis not present

## 2024-07-29 DIAGNOSIS — G893 Neoplasm related pain (acute) (chronic): Secondary | ICD-10-CM | POA: Diagnosis not present

## 2024-07-29 DIAGNOSIS — F17290 Nicotine dependence, other tobacco product, uncomplicated: Secondary | ICD-10-CM | POA: Diagnosis present

## 2024-07-29 DIAGNOSIS — N179 Acute kidney failure, unspecified: Secondary | ICD-10-CM | POA: Diagnosis present

## 2024-07-29 DIAGNOSIS — Z853 Personal history of malignant neoplasm of breast: Secondary | ICD-10-CM

## 2024-07-29 LAB — COMPREHENSIVE METABOLIC PANEL WITH GFR
ALT: 188 U/L — ABNORMAL HIGH (ref 0–44)
AST: 461 U/L — ABNORMAL HIGH (ref 15–41)
Albumin: 3.2 g/dL — ABNORMAL LOW (ref 3.5–5.0)
Alkaline Phosphatase: 566 U/L — ABNORMAL HIGH (ref 38–126)
Anion gap: 23 — ABNORMAL HIGH (ref 5–15)
BUN: 84 mg/dL — ABNORMAL HIGH (ref 8–23)
CO2: 17 mmol/L — ABNORMAL LOW (ref 22–32)
Calcium: 9.6 mg/dL (ref 8.9–10.3)
Chloride: 98 mmol/L (ref 98–111)
Creatinine, Ser: 1.76 mg/dL — ABNORMAL HIGH (ref 0.44–1.00)
GFR, Estimated: 29 mL/min — ABNORMAL LOW (ref >60)
Glucose, Bld: 89 mg/dL (ref 70–99)
Potassium: 5.1 mmol/L (ref 3.5–5.1)
Sodium: 138 mmol/L (ref 135–145)
Total Bilirubin: 22.4 mg/dL (ref 0.0–1.2)
Total Protein: 6.2 g/dL — ABNORMAL LOW (ref 6.5–8.1)

## 2024-07-29 LAB — CBC WITH DIFFERENTIAL/PLATELET
Abs Immature Granulocytes: 0.32 K/uL — ABNORMAL HIGH (ref 0.00–0.07)
Basophils Absolute: 0 K/uL (ref 0.0–0.1)
Basophils Relative: 0 %
Eosinophils Absolute: 0 K/uL (ref 0.0–0.5)
Eosinophils Relative: 0 %
HCT: 31.8 % — ABNORMAL LOW (ref 36.0–46.0)
Hemoglobin: 10.3 g/dL — ABNORMAL LOW (ref 12.0–15.0)
Immature Granulocytes: 2 %
Lymphocytes Relative: 2 %
Lymphs Abs: 0.4 K/uL — ABNORMAL LOW (ref 0.7–4.0)
MCH: 34.2 pg — ABNORMAL HIGH (ref 26.0–34.0)
MCHC: 32.4 g/dL (ref 30.0–36.0)
MCV: 105.6 fL — ABNORMAL HIGH (ref 80.0–100.0)
Monocytes Absolute: 1.1 K/uL — ABNORMAL HIGH (ref 0.1–1.0)
Monocytes Relative: 5 %
Neutro Abs: 18.6 K/uL — ABNORMAL HIGH (ref 1.7–7.7)
Neutrophils Relative %: 91 %
Platelets: 68 K/uL — ABNORMAL LOW (ref 150–400)
RBC: 3.01 MIL/uL — ABNORMAL LOW (ref 3.87–5.11)
WBC: 20.5 K/uL — ABNORMAL HIGH (ref 4.0–10.5)
nRBC: 2 % — ABNORMAL HIGH (ref 0.0–0.2)

## 2024-07-29 LAB — CBG MONITORING, ED
Glucose-Capillary: 39 mg/dL — CL (ref 70–99)
Glucose-Capillary: 51 mg/dL — ABNORMAL LOW (ref 70–99)
Glucose-Capillary: 73 mg/dL (ref 70–99)
Glucose-Capillary: 102 mg/dL — ABNORMAL HIGH (ref 70–99)
Glucose-Capillary: 25 mg/dL — CL (ref 70–99)
Glucose-Capillary: 28 mg/dL — CL (ref 70–99)
Glucose-Capillary: 35 mg/dL — CL (ref 70–99)
Glucose-Capillary: 265 mg/dL — ABNORMAL HIGH (ref 70–99)

## 2024-07-29 LAB — URINALYSIS, ROUTINE W REFLEX MICROSCOPIC
Glucose, UA: >=500 mg/dL — AB
Hgb urine dipstick: NEGATIVE
Ketones, ur: NEGATIVE mg/dL
Nitrite: NEGATIVE
Protein, ur: NEGATIVE mg/dL
Specific Gravity, Urine: 1.029 (ref 1.005–1.030)
WBC, UA: >50 WBC/hpf (ref 0–5)
pH: 5 (ref 5.0–8.0)

## 2024-07-29 LAB — GLUCOSE, CAPILLARY: Glucose-Capillary: 108 mg/dL — ABNORMAL HIGH (ref 70–99)

## 2024-07-29 LAB — PRO BRAIN NATRIURETIC PEPTIDE: Pro Brain Natriuretic Peptide: 12482 pg/mL — ABNORMAL HIGH (ref <300.0)

## 2024-07-29 LAB — LIPASE, BLOOD: Lipase: 220 U/L — ABNORMAL HIGH (ref 11–51)

## 2024-07-29 LAB — AMMONIA: Ammonia: 40 umol/L — ABNORMAL HIGH (ref 9–35)

## 2024-07-29 MED ORDER — DEXTROSE 50 % IV SOLN
INTRAVENOUS | Status: AC
  Filled 2024-07-29: qty 50

## 2024-07-29 MED ORDER — LEVOTHYROXINE SODIUM 75 MCG PO TABS
75.0000 ug | ORAL_TABLET | Freq: Every day | ORAL | Status: DC
  Administered 2024-07-30: 07:00:00 75 ug via ORAL
  Filled 2024-07-29: qty 1

## 2024-07-29 MED ORDER — CHLORHEXIDINE GLUCONATE CLOTH 2 % EX PADS: 6.0000 | MEDICATED_PAD | Freq: Every day | CUTANEOUS | Status: DC

## 2024-07-29 MED ORDER — SODIUM CHLORIDE 0.9 % IV SOLN: 2.0000 g | INTRAVENOUS | Status: DC

## 2024-07-29 MED ORDER — DEXTROSE 50 % IV SOLN
50.0000 mL | Freq: Once | INTRAVENOUS | Status: AC
  Administered 2024-07-29: 19:00:00 50 mL via INTRAVENOUS
  Filled 2024-07-29: qty 50

## 2024-07-29 MED ORDER — DEXTROSE 50 % IV SOLN
50.0000 mL | Freq: Once | INTRAVENOUS | Status: DC
  Filled 2024-07-29: qty 50

## 2024-07-29 MED ORDER — SENNA 8.6 MG PO TABS: 1.0000 | ORAL_TABLET | Freq: Every day | ORAL | Status: DC | PRN

## 2024-07-29 MED ORDER — ONDANSETRON HCL 4 MG PO TABS: 4.0000 mg | ORAL_TABLET | Freq: Four times a day (QID) | ORAL | Status: DC | PRN

## 2024-07-29 MED ORDER — METRONIDAZOLE 500 MG/100ML IV SOLN
500.0000 mg | Freq: Two times a day (BID) | INTRAVENOUS | Status: DC
  Administered 2024-07-30: 07:00:00 500 mg via INTRAVENOUS
  Filled 2024-07-29: qty 100

## 2024-07-29 MED ORDER — UMECLIDINIUM-VILANTEROL 62.5-25 MCG/ACT IN AEPB
1.0000 | INHALATION_SPRAY | Freq: Every day | RESPIRATORY_TRACT | Status: DC
  Administered 2024-07-30: 08:00:00 1 via RESPIRATORY_TRACT
  Filled 2024-07-29: qty 14

## 2024-07-29 MED ORDER — GLUCAGON HCL RDNA (DIAGNOSTIC) 1 MG IJ SOLR
1.0000 mg | Freq: Once | INTRAMUSCULAR | Status: AC
  Administered 2024-07-29: 16:00:00 1 mg via INTRAVENOUS
  Filled 2024-07-29: qty 1

## 2024-07-29 MED ORDER — METRONIDAZOLE 500 MG/100ML IV SOLN
500.0000 mg | Freq: Once | INTRAVENOUS | Status: AC
  Administered 2024-07-29: 19:00:00 500 mg via INTRAVENOUS
  Filled 2024-07-29: qty 100

## 2024-07-29 MED ORDER — FENTANYL CITRATE (PF) 50 MCG/ML IJ SOSY
12.5000 ug | PREFILLED_SYRINGE | INTRAMUSCULAR | Status: DC | PRN
  Administered 2024-07-29: 23:00:00 50 ug via INTRAVENOUS
  Filled 2024-07-29: qty 1

## 2024-07-29 MED ORDER — ONDANSETRON HCL 4 MG/2ML IJ SOLN: 4.0000 mg | Freq: Four times a day (QID) | INTRAMUSCULAR | Status: DC | PRN

## 2024-07-29 MED ORDER — SODIUM CHLORIDE 0.9% FLUSH
3.0000 mL | Freq: Two times a day (BID) | INTRAVENOUS | Status: DC
  Administered 2024-07-29 – 2024-07-30 (×3): 3 mL via INTRAVENOUS

## 2024-07-29 MED ORDER — SODIUM CHLORIDE 0.9 % IV SOLN
2.0000 g | Freq: Once | INTRAVENOUS | Status: AC
  Administered 2024-07-29: 18:00:00 2 g via INTRAVENOUS
  Filled 2024-07-29: qty 12.5

## 2024-07-29 NOTE — ED Notes (Signed)
 ED TO INPATIENT HANDOFF REPORT  Name/Age/Gender Rachel Mcbride 79 y.o. female  Code Status    Code Status Orders  (From admission, onward)           Start     Ordered   07/29/24 2050  Do not attempt resuscitation (DNR)- Limited -Do Not Intubate (DNI)  Continuous       Question Answer Comment  If pulseless and not breathing No CPR or chest compressions.   In Pre-Arrest Conditions (Patient Is Breathing and Has A Pulse) Do not intubate. Provide all appropriate non-invasive medical interventions. Avoid ICU transfer unless indicated or required.   Consent: Discussion documented in EHR or advanced directives reviewed      07/29/24 2052           Code Status History     This patient has a current code status but no historical code status.       Home/SNF/Other Home  Chief Complaint Pancreatic mass [K86.89]  Level of Care/Admitting Diagnosis ED Disposition     ED Disposition  Admit   Condition  --   Comment  [facility] [[id number]]  Level of Care: Stepdown [14]  Admit to SDU based on following criteria: Severe physiological/psychological symptoms:  Any diagnosis requiring assessment & intervention at least every 4 hours on an ongoing basis to obtain desired patient outcomes including stability and rehabilitation  May admit patient to [facility] or [facility] if equivalent level of care is available:: Yes  Diagnosis: Pancreatic mass [320391]  Admitting Physician: CHARLTON EVALENE RAMAN [[id number]]  Attending Physician: CHARLTON EVALENE RAMAN [[id number]]  Certification:: I certify this patient will need inpatient services for at least 2 midnights  Expected Medical Readiness: 08/01/2024          Medical History Past Medical History:  Diagnosis Date   Anemia    Atrial flutter (HCC) 2011   occurred with CHF; no current dysrhythmia   Breast cancer (HCC) 04/2010   Cataract    bilateral   CHF (congestive heart failure) (HCC) 2011   COPD  (chronic obstructive pulmonary disease) (HCC)    denies SOB with daily activities   Cough 08/31/2011   Cough, persistent 09/21/2015   Dysuria 11/22/2014   Hypertension    under control; has been on med. x 2 yrs.   Lung nodule seen on imaging study 09/21/2015   Neuropathy of leg    since chemo; right leg   Osteopenia 12/31/2011   Personal history of chemotherapy    Personal history of radiation therapy    Pneumonia 2011   Scoliosis    since chemo   Weight loss 09/21/2015    Allergies Allergies[1]  IV Location/Drains/Wounds Patient Lines/Drains/Airways Status     Active Line/Drains/Airways     Name Placement date Placement time Site Days   Peripheral IV 07/29/24 20 G Anterior;Left Forearm 07/29/24  1433  Forearm  less than 1   Peripheral IV 07/29/24 20 G Anterior;Right Forearm 07/29/24  2100  Forearm  less than 1   Peripheral IV 07/29/24 20 G Right Forearm 07/29/24  2110  Forearm  less than 1   Urethral Catheter [name], rn, msn Temperature probe 16 Fr. 07/29/24  2104  Temperature probe  less than 1            Labs/Imaging Results for orders placed or performed during the hospital encounter of 07/29/24 (from the past 48 hours)  CBG monitoring, ED     Status: Abnormal  Collection Time: 07/29/24  2:44 PM  Result Value Ref Range   Glucose-Capillary 39 (LL) 70 - 99 mg/dL    Comment: Glucose reference range applies only to samples taken after fasting for at least 8 hours.  CBC with Differential     Status: Abnormal   Collection Time: 07/29/24  2:47 PM  Result Value Ref Range   WBC 20.5 (H) 4.0 - 10.5 K/uL   RBC 3.01 (L) 3.87 - 5.11 MIL/uL   Hemoglobin 10.3 (L) 12.0 - 15.0 g/dL   HCT 68.1 (L) 63.9 - 53.9 %   MCV 105.6 (H) 80.0 - 100.0 fL   MCH 34.2 (H) 26.0 - 34.0 pg   MCHC 32.4 30.0 - 36.0 g/dL   RDW Not Measured 88.4 - 15.5 %   Platelets 68 (L) 150 - 400 K/uL    Comment: Immature Platelet Fraction may be clinically indicated, consider ordering this additional  test [id number]    nRBC 2.0 (H) 0.0 - 0.2 %   Neutrophils Relative % 91 %   Neutro Abs 18.6 (H) 1.7 - 7.7 K/uL   Lymphocytes Relative 2 %   Lymphs Abs 0.4 (L) 0.7 - 4.0 K/uL   Monocytes Relative 5 %   Monocytes Absolute 1.1 (H) 0.1 - 1.0 K/uL   Eosinophils Relative 0 %   Eosinophils Absolute 0.0 0.0 - 0.5 K/uL   Basophils Relative 0 %   Basophils Absolute 0.0 0.0 - 0.1 K/uL   Immature Granulocytes 2 %   Abs Immature Granulocytes 0.32 (H) 0.00 - 0.07 K/uL   Polychromasia PRESENT    Target Cells PRESENT     Comment: Performed at [facility], [address] [place], [place] [address]  Comprehensive metabolic panel     Status: Abnormal   Collection Time: 07/29/24  2:47 PM  Result Value Ref Range   Sodium 138 135 - 145 mmol/L   Potassium 5.1 3.5 - 5.1 mmol/L   Chloride 98 98 - 111 mmol/L   CO2 17 (L) 22 - 32 mmol/L   Glucose, Bld 89 70 - 99 mg/dL    Comment: Glucose reference range applies only to samples taken after fasting for at least 8 hours.   BUN 84 (H) 8 - 23 mg/dL   Creatinine, Ser 8.23 (H) 0.44 - 1.00 mg/dL    Comment: ICTERUS AT THIS LEVEL MAY AFFECT RESULT   Calcium 9.6 8.9 - 10.3 mg/dL   Total Protein 6.2 (L) 6.5 - 8.1 g/dL    Comment: ICTERUS AT THIS LEVEL MAY AFFECT RESULT   Albumin 3.2 (L) 3.5 - 5.0 g/dL   AST 538 (H) 15 - 41 U/L   ALT 188 (H) 0 - 44 U/L   Alkaline Phosphatase 566 (H) 38 - 126 U/L   Total Bilirubin 22.4 (HH) 0.0 - 1.2 mg/dL    Comment: Critical Value, Read Back and verified with [name], RN 07/29/24 1648 [name]   GFR, Estimated 29 (L) >60 mL/min    Comment: (NOTE) Calculated using the CKD-EPI Creatinine Equation (2021)    Anion gap 23 (H) 5 - 15    Comment: Electrolytes repeated to verify  Performed at [facility], [name], [place], [place] [address]   Lipase, blood     Status: Abnormal   Collection Time: 07/29/24  2:47 PM  Result Value Ref Range   Lipase 220 (H) 11 - 51 U/L    Comment:  Performed at [facility], [address]  [place], [place] [address]  Ammonia     Status: Abnormal   Collection Time: 07/29/24  2:47 PM  Result Value Ref Range   Ammonia 40 (H) 9 - 35 umol/L    Comment: Performed at [facility], [address] [place], [place] [address]  Pro Brain natriuretic peptide     Status: Abnormal   Collection Time: 07/29/24  2:47 PM  Result Value Ref Range   Pro Brain Natriuretic Peptide 12,482.0 (H) <300.0 pg/mL    Comment: (NOTE) Age Group        Cut-Points    Interpretation  < 50 years     450 pg/mL       NT-proBNP > 450 pg/mL indicates                                ADHF is likely              50 to 75 years  900 pg/mL      NT-proBNP > 900 pg/mL indicates          ADHF is likely  > 75 years      1800 pg/mL     NT-proBNP > 1800 pg/mL indicates          ADHF is likely                           All ages    Results between       Indeterminate. Further clinical             300 and the cut-   information is needed to determine            point for age group   if ADHF is present.                                                             Elecsys proBNP II/ Elecsys proBNP II STAT           Cut-Point                       Interpretation  300 pg/mL                    NT-proBNP <300pg/mL indicates                             ADHF is not likely  Performed at [facility], [address], [place], [place] [address]   CBG monitoring, ED     Status: Abnormal   Collection Time: 07/29/24  3:56 PM  Result Value Ref Range   Glucose-Capillary 51 (L) 70 - 99 mg/dL    Comment: Glucose reference range applies only to samples taken after fasting for at least 8 hours.  CBG monitoring, ED     Status: None   Collection Time: 07/29/24  5:38 PM  Result Value Ref Range   Glucose-Capillary 73 70 - 99 mg/dL    Comment: Glucose reference range applies only to samples taken after fasting for at least 8 hours.  CBG  monitoring, ED  Status: Abnormal   Collection Time: 07/29/24  6:38 PM  Result Value Ref Range   Glucose-Capillary 25 (LL) 70 - 99 mg/dL    Comment: Glucose reference range applies only to samples taken after fasting for at least 8 hours.   Comment 1 Repeat Test   CBG monitoring, ED     Status: Abnormal   Collection Time: 07/29/24  6:40 PM  Result Value Ref Range   Glucose-Capillary 28 (LL) 70 - 99 mg/dL    Comment: Glucose reference range applies only to samples taken after fasting for at least 8 hours.  CBG monitoring, ED     Status: Abnormal   Collection Time: 07/29/24  7:01 PM  Result Value Ref Range   Glucose-Capillary 35 (LL) 70 - 99 mg/dL    Comment: Glucose reference range applies only to samples taken after fasting for at least 8 hours.  CBG monitoring, ED     Status: Abnormal   Collection Time: 07/29/24  7:26 PM  Result Value Ref Range   Glucose-Capillary 102 (H) 70 - 99 mg/dL    Comment: Glucose reference range applies only to samples taken after fasting for at least 8 hours.  CBG monitoring, ED     Status: Abnormal   Collection Time: 07/29/24  8:42 PM  Result Value Ref Range   Glucose-Capillary 265 (H) 70 - 99 mg/dL    Comment: Glucose reference range applies only to samples taken after fasting for at least 8 hours.  Urinalysis, Routine w reflex microscopic -Urine, Clean Catch     Status: Abnormal   Collection Time: 07/29/24  9:10 PM  Result Value Ref Range   Color, Urine AMBER (A) YELLOW    Comment: BIOCHEMICALS MAY BE AFFECTED BY COLOR   APPearance HAZY (A) CLEAR   Specific Gravity, Urine 1.029 1.005 - 1.030   pH 5.0 5.0 - 8.0   Glucose, UA >=500 (A) NEGATIVE mg/dL   Hgb urine dipstick NEGATIVE NEGATIVE   Bilirubin Urine SMALL (A) NEGATIVE   Ketones, ur NEGATIVE NEGATIVE mg/dL   Protein, ur NEGATIVE NEGATIVE mg/dL   Nitrite NEGATIVE NEGATIVE   Leukocytes,Ua MODERATE (A) NEGATIVE   RBC / HPF 0-5 0 - 5 RBC/hpf   WBC, UA >50 0 - 5 WBC/hpf   Bacteria, UA FEW (A)  NONE SEEN   Squamous Epithelial / HPF 0-5 0 - 5 /HPF   Mucus PRESENT    Granular Casts, UA PRESENT     Comment: Performed at [facility], [address], [place], [place] [address]   CT OUTSIDE FILMS BODY/ABD/PELVIS Result Date: 07/29/2024 This examination belongs to an outside facility and is stored here for comparison purposes only.  Contact the originating outside institution for any associated report or interpretation.   Pending Labs Unresulted Labs (From admission, onward)     Start     Ordered   07/30/24 0500  Hemoglobin A1c  Tomorrow morning,   R       Comments: To assess prior glycemic control    07/29/24 2052   07/30/24 0500  Basic metabolic panel  Daily,   R      07/29/24 2052   07/30/24 0500  Hepatic function panel  Daily,   R      07/29/24 2052   07/30/24 0500  CBC  Daily,   R      07/29/24 2052   07/30/24 0500  Protime-INR  Daily,   R      07/29/24 2052   07/30/24  0500  Cortisol-am, blood  Tomorrow morning,   R        07/29/24 2253   07/29/24 2301  Haptoglobin  Once,   R        07/29/24 2301   07/29/24 2301  Lactate dehydrogenase  Once,   R        07/29/24 2301   07/29/24 2301  DIC Panel Once  Once,   R        07/29/24 2301   07/29/24 2155  Sodium, urine, random  Add-on,   AD        07/29/24 2154   07/29/24 2155  Creatinine, urine, random  Add-on,   AD        07/29/24 2154   07/29/24 2032  Blood culture (routine x 2)  BLOOD CULTURE X 2,   R (with STAT occurrences)      07/29/24 2032            Vitals/Pain Today's Vitals   07/29/24 2100 07/29/24 2115 07/29/24 2215 07/29/24 2300  BP: (!) 122/57 (!) 112/54 (!) 116/58 (!) 121/59  Pulse: (!) 104 (!) 102 (!) 106 (!) 104  Resp: (!) 24 (!) 24 20 16   Temp:  98.1 F (36.7 C) 98.9 F (37.2 C) 99.4 F (37.4 C)  TempSrc:      SpO2: 100% 100% 99% 95%  Weight:      Height:      PainSc:        Isolation Precautions No active isolations  Medications Medications  levothyroxine   (SYNTHROID ) tablet 75 mcg (has no administration in time range)  umeclidinium-vilanterol (ANORO ELLIPTA ) 62.5-25 MCG/ACT 1 puff (has no administration in time range)  sodium chloride  flush (NS) 0.9 % injection 3 mL (3 mLs Intravenous Given 07/29/24 2218)  fentaNYL  (SUBLIMAZE ) injection 12.5-50 mcg (50 mcg Intravenous Given 07/29/24 2236)  senna (SENOKOT) tablet 8.6 mg (has no administration in time range)  ondansetron  (ZOFRAN ) tablet 4 mg (has no administration in time range)    Or  ondansetron  (ZOFRAN ) injection 4 mg (has no administration in time range)  metroNIDAZOLE  (FLAGYL ) IVPB 500 mg (has no administration in time range)  ceFEPIme  (MAXIPIME ) 2 g in sodium chloride  0.9 % 100 mL IVPB (has no administration in time range)  glucagon  (human recombinant) (GLUCAGEN ) injection 1 mg (1 mg Intravenous Given 07/29/24 1559)  ceFEPIme  (MAXIPIME ) 2 g in sodium chloride  0.9 % 100 mL IVPB (0 g Intravenous Stopped 07/29/24 1810)    And  metroNIDAZOLE  (FLAGYL ) IVPB 500 mg (0 mg Intravenous Stopped 07/29/24 1954)  dextrose  50 % solution 50 mL (50 mLs Intravenous Given 07/29/24 1846)    Mobility non-ambulatory     [1]  Allergies Allergen Reactions   Pneumococcal Vaccines Itching   Shellfish Protein-Containing Drug Products Itching

## 2024-07-29 NOTE — H&P (Signed)
 History and Physical    Rachel Mcbride FMW:[id number] DOB: 1945-12-28 DOA: 07/29/2024  PCP: Loring Tanda Mae, MD   Patient coming from: Home   Chief Complaint: Loss of appetite, leg swelling, general weakness, jaundice, abdominal discomfort, abnormal outpatient CT   HPI: Rachel Mcbride is a 78 y.o. female with medical history significant for COPD, hypothyroidism, chronic HFmrEF, and remote breast cancer who presents with anorexia, generalized weakness, bilateral lower extremity swelling, and abnormal outpatient CT.  Patient reports roughly 3 weeks of progressive loss of appetite with generalized weakness, bilateral leg swelling, foot pain and toe discoloration bilaterally, abdominal discomfort, and jaundice.  She had an outpatient CT performed which was concerning for pancreatic mass with pancreatic ductal dilatation, numerous liver masses, intra and extrahepatic biliary dilatation, most concerning for primary pancreatic cancer with metastasis.  ED Course: Upon arrival to the ED, patient is found to have core temp of 35.1 C with mild tachycardia and stable blood pressure.  Labs are most notable for creatinine 1.76, alkaline phosphatase 566, AST 461, ALT 188, total bilirubin 22.4, WBC 20,500, platelets 68,000, and proBNP 12,482.  GI (Dr. Suzann) was consulted by the ED PA and the patient was treated with 50% dextrose , glucagon , cefepime , Flagyl , and warming blankets.  Review of Systems:  All other systems reviewed and apart from HPI, are negative.  Past Medical History:  Diagnosis Date   Anemia    Atrial flutter (HCC) 2011   occurred with CHF; no current dysrhythmia   Breast cancer (HCC) 04/2010   Cataract    bilateral   CHF (congestive heart failure) (HCC) 2011   COPD (chronic obstructive pulmonary disease) (HCC)    denies SOB with daily activities   Cough 08/31/2011   Cough, persistent 09/21/2015   Dysuria 11/22/2014   Hypertension    under control; has been on med.  x 2 yrs.   Lung nodule seen on imaging study 09/21/2015   Neuropathy of leg    since chemo; right leg   Osteopenia 12/31/2011   Personal history of chemotherapy    Personal history of radiation therapy    Pneumonia 2011   Scoliosis    since chemo   Weight loss 09/21/2015    Past Surgical History:  Procedure Laterality Date   BRONCHOSCOPY  04/03/2010   with endobronchial ultrasound   MASTECTOMY Right 04/20/2010   right   PORT-A-CATH REMOVAL  09/05/2011   Procedure: REMOVAL PORT-A-CATH;  Surgeon: Donnice Bury, MD;  Location: [facility];  Service: General;  Laterality: Left;   PORTACATH PLACEMENT  05/17/2010    Social History:   reports that she quit smoking about 14 years ago. Her smoking use included cigarettes. She started smoking about 64 years ago. She has a 100 pack-year smoking history. She has never used smokeless tobacco. She reports that she does not drink alcohol  and does not use drugs.  Allergies[1]  Family History  Problem Relation Age of Onset   Heart attack Mother    Stroke Mother    Heart disease Sister    Cancer Sister        ? mouth   Breast cancer Neg Hx    Colon cancer Neg Hx    Stomach cancer Neg Hx    Esophageal cancer Neg Hx      Prior to Admission medications  Medication Sig Start Date End Date Taking? Authorizing Provider  albuterol  (VENTOLIN  HFA) 108 (90 Base) MCG/ACT inhaler Inhale 2 puffs into the lungs every 6 (six) hours  as needed for wheezing or shortness of breath. 04/03/23   Jude Harden GAILS, MD  ANORO ELLIPTA  62.5-25 MCG/ACT AEPB INHALE 1 PUFF BY MOUTH EVERY DAY 04/14/24   Alva, Rakesh V, MD  Calcium Carbonate (CALCIUM 600 PO) Take 2 capsules by mouth daily.     [provider]  Cholecalciferol (VITAMIN D PO) Take 1,000 Units by mouth daily.    [provider]  ferrous sulfate 325 (65 FE) MG tablet Take 325 mg by mouth 2 (two) times daily with a meal.    [provider]  levothyroxine  (SYNTHROID ,  LEVOTHROID) 75 MCG tablet Take 75 mcg by mouth daily. 05/20/14   [provider]    Physical Exam: Vitals:   07/29/24 2045 07/29/24 2100 07/29/24 2115 07/29/24 2215  BP: 118/76 (!) 122/57 (!) 112/54 (!) 116/58  Pulse:  (!) 104 (!) 102 (!) 106  Resp: 19 (!) 24 (!) 24 20  Temp:   98.1 F (36.7 C) 98.9 F (37.2 C)  TempSrc:      SpO2:  100% 100% 99%  Weight:      Height:         Constitutional: NAD, no diaphoresis, cachectic    Eyes: PERTLA, lids and conjunctivae normal ENMT: Mucous membranes are moist. Posterior pharynx clear of any exudate or lesions.   Neck: supple, no masses  Respiratory: no wheezing, no crackles. No accessory muscle use.  Cardiovascular: S1 & S2 heard, regular rate and rhythm. Bilateral LE edema. Abdomen: No tenderness, soft. Bowel sounds active.  Musculoskeletal: no clubbing / cyanosis. No joint deformity upper and lower extremities.   Skin: Jaundice. Dark purple toes bilaterally.   Neurologic: CN 2-12 grossly intact. Moving all extremities. Alert and oriented to person, place, and situation.  Psychiatric: Calm. Cooperative.    Labs and Imaging on Admission: I have personally reviewed following labs and imaging studies  CBC: Recent Labs  Lab 07/29/24 1447  WBC 20.5*  NEUTROABS 18.6*  HGB 10.3*  HCT 31.8*  MCV 105.6*  PLT 68*   Basic Metabolic Panel: Recent Labs  Lab 07/29/24 1447  NA 138  K 5.1  CL 98  CO2 17*  GLUCOSE 89  BUN 84*  CREATININE 1.76*  CALCIUM 9.6   GFR: Estimated Creatinine Clearance: 16.2 mL/min (A) (by C-G formula based on SCr of 1.76 mg/dL (H)). Liver Function Tests: Recent Labs  Lab 07/29/24 1447  AST 461*  ALT 188*  ALKPHOS 566*  BILITOT 22.4*  PROT 6.2*  ALBUMIN 3.2*   Recent Labs  Lab 07/29/24 1447  LIPASE 220*   Recent Labs  Lab 07/29/24 1447  AMMONIA 40*   Coagulation Profile: No results for input(s): INR, PROTIME in the last 168 hours. Cardiac Enzymes: No results for input(s):  CKTOTAL, CKMB, CKMBINDEX, TROPONINI in the last 168 hours. BNP (last 3 results) Recent Labs    07/29/24 1447  PROBNP 12,482.0*   HbA1C: No results for input(s): HGBA1C in the last 72 hours. CBG: Recent Labs  Lab 07/29/24 1838 07/29/24 1840 07/29/24 1901 07/29/24 1926 07/29/24 2042  GLUCAP 25* 28* 35* 102* 265*   Lipid Profile: No results for input(s): CHOL, HDL, LDLCALC, TRIG, CHOLHDL, LDLDIRECT in the last 72 hours. Thyroid  Function Tests: No results for input(s): TSH, T4TOTAL, FREET4, T3FREE, THYROIDAB in the last 72 hours. Anemia Panel: No results for input(s): VITAMINB12, FOLATE, FERRITIN, TIBC, IRON, RETICCTPCT in the last 72 hours. Urine analysis:    Component Value Date/Time   COLORURINE AMBER (A) 07/29/2024 2110  APPEARANCEUR HAZY (A) 07/29/2024 2110   LABSPEC 1.029 07/29/2024 2110   LABSPEC 1.010 11/22/2014 1109   PHURINE 5.0 07/29/2024 2110   GLUCOSEU >=500 (A) 07/29/2024 2110   GLUCOSEU Negative 11/22/2014 1109   HGBUR NEGATIVE 07/29/2024 2110   BILIRUBINUR SMALL (A) 07/29/2024 2110   BILIRUBINUR Negative 11/22/2014 1109   KETONESUR NEGATIVE 07/29/2024 2110   PROTEINUR NEGATIVE 07/29/2024 2110   UROBILINOGEN 0.2 11/22/2014 1109   NITRITE NEGATIVE 07/29/2024 2110   LEUKOCYTESUR MODERATE (A) 07/29/2024 2110   LEUKOCYTESUR Moderate 11/22/2014 1109   Sepsis Labs: @LABRCNTIP (procalcitonin:4,lacticidven:4) )No results found for this or any previous visit (from the past 240 hours).   Radiological Exams on Admission: CT OUTSIDE FILMS BODY/ABD/PELVIS Result Date: 07/29/2024 This examination belongs to an outside facility and is stored here for comparison purposes only.  Contact the originating outside institution for any associated report or interpretation.   EKG: Independently reviewed. Sinus rhythm, PAC.   Assessment/Plan   1. Pancreatic mass; liver masses; malignant biliary obstruction; ?cholangitis  -  Patient with anorexia, abdominal discomfort, and jaundice sent to ED after outpatient CT with findings most concerning for primary pancreatic cancer with liver metastases and biliary obstruction  - She is afebrile but has multiple SIRS criteria and possible cholangitis   - Check INR, trend LFTs, continue empiric antibiotics, follow-up on GI recommendations    2. Hypoglycemia  - Continue frequent CBG checks, check cortisol, continue to treat as-needed    3. Hypothermia  - Due to hypoglycemia and possible sepsis  - Treat hypoglycemia, continue antibiotics, continue warming measures   4. Thrombocytopenia  - Platelets 68,000 on admission in setting of liver dysfunction and possible sepsis; no schistocytes reported   - Treat possible infection, check haptoglobin, LDH, D-dimer, fibrinogen, and PT/INR, repeat CBC in am    5. AKI  - SCr is 1.76; was <1 remotely  - Suspect this is acute given recent anorexia; no hydronephrosis on CT    - Check urine chemistries, renally-dose medications, repeat chem panel in am   6. COPD  - Not in exacerbation  - Continue LAMA-LABA   7. Chronic HFmrEF  - EF was 45-50% in 2011  - Legs are edematous and Pro-BNP elevated without respiratory s/s  - Monitor weight and I/Os    8. Toe discoloration  - Multiple dark purple toes bilaterally with pain, no ulcers  - Check ABIs     DVT prophylaxis: SCDs  Code Status: DNR/DNI, confirmed on admission  Level of Care: Level of care: Stepdown Family Communication: Husband updated in ED   Disposition Plan:  Patient is from: Home  Anticipated d/c is to: TBD Anticipated d/c date is: 08/01/24  Patient currently: Pending GI consultation  Consults called: GI  Admission status: Inpatient     Evalene GORMAN Sprinkles, MD [facility]  07/29/2024, 10:45 PM       [1]  Allergies Allergen Reactions   Pneumococcal Vaccines Itching   Shellfish Protein-Containing Drug Products Itching

## 2024-07-29 NOTE — ED Triage Notes (Signed)
 Pt has not been eating, drinking, has bilateral leg swelling, increasingly weak over the last 4 days. Pt had CT scan of abdomen today and was told to come to ER immediately. Pt is jaundice, family reports this has been about a week.  Pt has hx of breast cancer, in remission.

## 2024-07-29 NOTE — ED Provider Notes (Signed)
 [place] EMERGENCY DEPARTMENT AT [facility] Provider Note   CSN: [id number] Arrival date & time: 07/29/24  1232     Patient presents with: Weakness   Rachel Mcbride is a 78 y.o. female.   The history is provided by the patient, the spouse and medical records. No language interpreter was used.  Weakness    78 year old female with history of CHF, COPD, remote history of breast cancer status post chemoradiation along with mastectomy presenting to ER with concern of generalized weakness.  History obtained through patient and through husband who is at bedside.  About 3 weeks ago patient complaining of abdominal discomfort.  Went to see her PCP and her PCP did order a CT scan of her abdomen pelvis to be done but it was not done until today.  They went to the radiology place and had it done and immediately was told to go to the ER for further evaluation.  For the past 3 weeks patient has had significant weight loss, lack of appetite, generalized fatigue, pain to her buttock, and yellowing of her skin color.  She endorsed some occasional itchiness.  She has noticed that her urine is darker and her stool is lighter.  She does not notice nausea or vomiting does not complain of any significant chest pain or shortness of breath no fever chills or productive cough.  Remote history of breast cancer status postmastectomy and chemoradiation.  She does vape on a regular basis but denies any alcohol  use and has not drink any alcohol  for at least 5 years.  Her CODE STATUS is DNR/DNI  Prior to Admission medications  Medication Sig Start Date End Date Taking? Authorizing Provider  albuterol  (VENTOLIN  HFA) 108 (90 Base) MCG/ACT inhaler Inhale 2 puffs into the lungs every 6 (six) hours as needed for wheezing or shortness of breath. 04/03/23   Jude Harden GAILS, MD  ANORO ELLIPTA  62.5-25 MCG/ACT AEPB INHALE 1 PUFF BY MOUTH EVERY DAY 04/14/24   Alva, Rakesh V, MD  Calcium Carbonate (CALCIUM 600 PO) Take  2 capsules by mouth daily.     [provider]  Cholecalciferol (VITAMIN D PO) Take 1,000 Units by mouth daily.    [provider]  ferrous sulfate 325 (65 FE) MG tablet Take 325 mg by mouth 2 (two) times daily with a meal.    [provider]  levothyroxine  (SYNTHROID , LEVOTHROID) 75 MCG tablet Take 75 mcg by mouth daily. 05/20/14   [provider]    Allergies: Pneumococcal vaccines and Shellfish protein-containing drug products    Review of Systems  Neurological:  Positive for weakness.  All other systems reviewed and are negative.   Updated Vital Signs BP (!) 137/59   Pulse 86   Temp (!) 95.1 F (35.1 C) (Rectal)   Resp 11   Ht 5' (1.524 m)   Wt 39.5 kg   SpO2 94%   BMI 16.99 kg/m   Physical Exam Vitals and nursing note reviewed.  Constitutional:      General: She is not in acute distress.    Appearance: She is well-developed. She is ill-appearing.     Comments: Profoundly ill-appearing and very jaundiced  HENT:     Head: Atraumatic.  Eyes:     Conjunctiva/sclera: Conjunctivae normal.  Cardiovascular:     Rate and Rhythm: Tachycardia present.     Pulses: Normal pulses.     Heart sounds: Normal heart sounds.  Pulmonary:     Effort: Pulmonary effort is normal.  Abdominal:     Palpations: Abdomen is soft.     Tenderness: There is no abdominal tenderness.  Musculoskeletal:     Cervical back: Neck supple. No rigidity.     Right lower leg: Edema present.     Left lower leg: Edema present.     Comments: 3+ pitting edema involving bilateral lower extremities with multiple blisters noted to multiple toes.  Skin:    Coloration: Skin is jaundiced.     Findings: No rash.  Neurological:     Mental Status: She is alert and oriented to person, place, and time.     Comments: Globally weak but equal strength throughout  Psychiatric:        Mood and Affect: Mood normal.     (all labs ordered are listed, but only abnormal results are  displayed) Labs Reviewed  CBC WITH DIFFERENTIAL/PLATELET - Abnormal; Notable for the following components:      Result Value   WBC 20.5 (*)    RBC 3.01 (*)    Hemoglobin 10.3 (*)    HCT 31.8 (*)    MCV 105.6 (*)    MCH 34.2 (*)    Platelets 68 (*)    nRBC 2.0 (*)    Neutro Abs 18.6 (*)    Lymphs Abs 0.4 (*)    Monocytes Absolute 1.1 (*)    Abs Immature Granulocytes 0.32 (*)    All other components within normal limits  COMPREHENSIVE METABOLIC PANEL WITH GFR - Abnormal; Notable for the following components:   CO2 17 (*)    BUN 84 (*)    Creatinine, Ser 1.76 (*)    Total Protein 6.2 (*)    Albumin 3.2 (*)    AST 461 (*)    ALT 188 (*)    Alkaline Phosphatase 566 (*)    Total Bilirubin 22.4 (*)    GFR, Estimated 29 (*)    Anion gap 23 (*)    All other components within normal limits  LIPASE, BLOOD - Abnormal; Notable for the following components:   Lipase 220 (*)    All other components within normal limits  AMMONIA - Abnormal; Notable for the following components:   Ammonia 40 (*)    All other components within normal limits  PRO BRAIN NATRIURETIC PEPTIDE - Abnormal; Notable for the following components:   Pro Brain Natriuretic Peptide 12,482.0 (*)    All other components within normal limits  CBG MONITORING, ED - Abnormal; Notable for the following components:   Glucose-Capillary 39 (*)    All other components within normal limits  CBG MONITORING, ED - Abnormal; Notable for the following components:   Glucose-Capillary 51 (*)    All other components within normal limits  CBG MONITORING, ED - Abnormal; Notable for the following components:   Glucose-Capillary 25 (*)    All other components within normal limits  CBG MONITORING, ED - Abnormal; Notable for the following components:   Glucose-Capillary 28 (*)    All other components within normal limits  URINALYSIS, ROUTINE W REFLEX MICROSCOPIC  CBG MONITORING, ED    EKG: EKG Interpretation Date/Time:  Wednesday  July 29 2024 14:54:59 EST Ventricular Rate:  93 PR Interval:  154 QRS Duration:  110 QT Interval:  354 QTC Calculation: 441 R Axis:   57  Text Interpretation: Sinus rhythm Atrial premature complex Left atrial enlargement Abnormal R-wave progression, early transition Minimal ST depression, inferior leads No significant change since last tracing Confirmed by Patt Alm DEL [phone]) on  07/29/2024 6:43:32 PM  Radiology: CT OUTSIDE FILMS BODY/ABD/PELVIS Result Date: 07/29/2024 This examination belongs to an outside facility and is stored here for comparison purposes only.  Contact the originating outside institution for any associated report or interpretation. ADDENDUM: OVERREAD OF AN OUTSIDE CT ABDOMEN AND PELVIS SUBMITTED FROM [facility] [place] DATED 07/29/2024. IMAGES ARE AVAILABLE BUT NO REPORT IS SUBMITTED. EXAM: CT ABDOMEN AND PELVIS WITH CONTRAST 07/29/2024  TECHNIQUE: CT of the abdomen and pelvis was performed with the administration of intravenous contrast. Multiplanar reformatted images are provided for review. Automated exposure control, iterative reconstruction, and/or weight-based adjustment of the mA/kV was utilized to reduce the radiation dose to as low as reasonably achievable.  COMPARISON: None available.  CLINICAL HISTORY:  FINDINGS:  LOWER CHEST: Emphysematous changes and scarring in the lungs. Previous right mastectomy with scarring in the right anterior chest, likely post-treatment.  LIVER: Numerous hypodense masses fill the liver consistent with diffuse metastatic disease.  GALLBLADDER AND BILE DUCTS: Gallbladder is unremarkable. Intra and extrahepatic bile duct dilatation.  SPLEEN: Calcified granulomas in the spleen.  PANCREAS: Hypodense mass centered at the body/tail of the pancreas measuring 4.1 x 4.1 cm in diameter. There is proximal pancreatic ductal dilatation and atrophy. This is likely to represent adenocarcinoma, primary of the  pancreas. Fat planes between the adjacent stomach and duodenum are obscured, suggesting probable local extension.  ADRENAL GLANDS: No acute abnormality.  KIDNEYS, URETERS AND BLADDER: Right pelvic kidney. Nephrograms are symmetrical and there is no hydronephrosis. No stones in the kidneys or ureters. No perinephric or periureteral stranding. The bladder is decompressed.  GI AND BOWEL: Stomach demonstrates no acute abnormality. The stomach, small bowel, and colon are not abnormally distended. No evidence of obstruction.  PERITONEUM AND RETROPERITONEUM: No ascites. No free air.  VASCULATURE: Aorta is normal in caliber.  LYMPH NODES: No definite lymphadenopathy.  REPRODUCTIVE ORGANS: No abnormal adnexal masses.  BONES AND SOFT TISSUES: No focal bone lesions. Degenerative changes throughout the lumbar spine with lumbar scoliosis convex towards the left. No focal soft tissue abnormality.  IMPRESSION: 1. Hypodense mass centered at the pancreatic body/tail measuring 4.1 x 4.1 cm with proximal pancreatic ductal dilatation and atrophy, most consistent with primary pancreatic adenocarcinoma with probable local extension. 2. Numerous hypodense liver masses consistent with diffuse metastatic disease. 3. Intrahepatic and extrahepatic biliary ductal dilatation.  Electronically signed by: Elsie Gravely MD 07/29/2024 05:55 PM EST RP Workstation: [id number]      Procedures   Medications Ordered in the ED  ceFEPIme  (MAXIPIME ) 2 g in sodium chloride  0.9 % 100 mL IVPB (2 g Intravenous New Bag/Given 07/29/24 1740)    And  metroNIDAZOLE  (FLAGYL ) IVPB 500 mg (has no administration in time range)  glucagon  (human recombinant) (GLUCAGEN ) injection 1 mg (1 mg Intravenous Given 07/29/24 1559)  dextrose  50 % solution 50 mL (50 mLs Intravenous Given 07/29/24 1846)                                    Medical Decision Making Amount and/or Complexity of Data Reviewed Labs:  ordered. ECG/medicine tests: ordered.  Risk Prescription drug management. Decision regarding hospitalization.   BP 114/63 (BP Location: Left Arm)   Pulse (!) 105   Resp 15   Ht 5' (1.524 m)   Wt 39.5 kg   SpO2 100%   BMI 16.99 kg/m   74:60 PM  78 year old female with history of CHF, COPD, remote history of  breast cancer status post chemoradiation along with mastectomy presenting to ER with concern of generalized weakness.  History obtained through patient and through husband who is at bedside.  About 3 weeks ago patient complaining of abdominal discomfort.  Went to see her PCP and her PCP did order a CT scan of her abdomen pelvis to be done but it was not done until today.  They went to the radiology place and had it done and immediately was told to go to the ER for further evaluation.  For the past 3 weeks patient has had significant weight loss, lack of appetite, generalized fatigue, pain to her buttock, and yellowing of her skin color.  She endorsed some occasional itchiness.  She has noticed that her urine is darker and her stool is lighter.  She does not notice nausea or vomiting does not complain of any significant chest pain or shortness of breath no fever chills or productive cough.  Remote history of breast cancer status postmastectomy and chemoradiation.  She does vape on a regular basis but denies any alcohol  use and has not drink any alcohol  for at least 5 years.  Her CODE STATUS is DNR/DNI  On exam, patient is acutely ill-appearing, very jaundiced, appears older than stated age.  She is cachectic however she does exhibits 3+ pitting involving bilateral lower extremities extending to her feet.  She has multiple hemorrhagic blisters noted to her toes bilaterally.  Evidence of onychomycosis to all toes.  She has pressure ulcer involving her sacral region.  She has scoliosis.  She has a soft nontender abdomen.  -Labs ordered, independently viewed and interpreted by me.  Labs remarkable  for initial CBG of 39, patient was given orange juice, and crackers as well as glucagon .  CBG initially improved but now it drops down to 28, patient will receive an amp of D50.  She has elevated white count of 20,, elevated lipase of 220, ammonia level is 40, she has evidence of transaminitis with AST 461, ALT 188, alk phos 5-6 and her total bili is 22.4.  She has an anion gap of 23.  proBNP is 12,000. -The patient was maintained on a cardiac monitor.  I personally viewed and interpreted the cardiac monitored which showed an underlying rhythm of: SR -Imaging noting abdomen pelvis CT scan CD that patient was brought and.  It was done through [facility].  No documentation was listed in the EMR.  I appreciate a radiologist to review with CT result.  He mention that patient has widespread metastatic disease to the liver, involving the biliary duct, part of the duodenum and stomach and he felt that the primary source is likely from pancreatic as the pancreatic duct is dilated.  No evidence of bone metastasis or bowel obstruction. -This patient presents to the ED for concern of abd pain, this involves an extensive number of treatment options, and is a complaint that carries with it a high risk of complications and morbidity.  The differential diagnosis includes cholecystitis, pancreatitis, ascending cholangitis, colitis, diverticulitis, pancreatitis, malignancy, biliary obstruction -Co morbidities that complicate the patient evaluation includes breast cancer, COPD, CHF -Treatment includes antibiotic including cefepime , and metronidazole  as well as D50 and glucagon .  Patient rectal temp is 95.1, will place [employer] -Reevaluation of the patient after these medicines showed that the patient stayed the same -PCP office notes or outside notes reviewed -Discussion with specialist [facility] Dr. McGridal who will have the team to evaluate pt tomorrow and determine plan of  care.   -Escalation to  admission/observation considered: patient agree with admission      Final diagnoses:  Pancreatic mass  Sepsis with acute liver failure without hepatic coma or septic shock, due to unspecified organism [facility])  Obstructive jaundice due to cancer [facility])    ED Discharge Orders     None          Nivia Colon, PA-C 07/29/24 2038    Dasie Faden, MD 07/30/24 [phone]

## 2024-07-29 NOTE — ED Notes (Signed)
 Delay in care due to bedbugs. Tech working to clean patient.

## 2024-07-29 NOTE — ED Notes (Signed)
[phone] °

## 2024-07-29 NOTE — ED Notes (Signed)
 First poc with patient.  Pt resting in bed with bairhugger in place.  Pt found to be incontinent of urine.  Patient cleansed and repositioned in bed with new linen supplied by primary RN and NT.  Pt tolerated fairly well.  Noted open wound to inner buttocks, no drainage.  Also note edematous, discolored toes and feet bilaterally.  Pt cool to touch and jaundice in color.  Pt repetitive and repeats wanting toes sprayed with something.  Urinary catheter placed with temp sensing capabilities due to no device in place to accurately monitor output nor body temperature.  Urine specimen collected at bedside as well as two sets of blood cultures.  X2 IV accesses placed to lower right forearm and mid right forearm, pt tolerated well.  Bairhugger replaced after cleaning.  Spouse spoken to by primary RN and notified will provided update once available.  Antibiotic hanging at bedside.  FSBS collected and patient currently not hypogylcemic.  Pt reconnected to continuous cardiac, pulse ox and bp monitoring.  No respiratory distress noted.

## 2024-07-29 NOTE — ED Notes (Signed)
 Was unable to obtain a temp on pt informed the triage nurse and the nurse that a sign to the pt

## 2024-07-29 NOTE — ED Notes (Signed)
Pt given OJ and sandwich

## 2024-07-30 ENCOUNTER — Inpatient Hospital Stay (HOSPITAL_COMMUNITY)

## 2024-07-30 ENCOUNTER — Telehealth (HOSPITAL_COMMUNITY): Payer: Self-pay

## 2024-07-30 ENCOUNTER — Other Ambulatory Visit (HOSPITAL_COMMUNITY): Payer: Self-pay

## 2024-07-30 DIAGNOSIS — Z515 Encounter for palliative care: Secondary | ICD-10-CM

## 2024-07-30 DIAGNOSIS — G893 Neoplasm related pain (acute) (chronic): Secondary | ICD-10-CM

## 2024-07-30 DIAGNOSIS — K8689 Other specified diseases of pancreas: Secondary | ICD-10-CM | POA: Diagnosis not present

## 2024-07-30 DIAGNOSIS — C787 Secondary malignant neoplasm of liver and intrahepatic bile duct: Secondary | ICD-10-CM

## 2024-07-30 DIAGNOSIS — Z79899 Other long term (current) drug therapy: Secondary | ICD-10-CM

## 2024-07-30 DIAGNOSIS — I429 Cardiomyopathy, unspecified: Principal | ICD-10-CM

## 2024-07-30 DIAGNOSIS — I509 Heart failure, unspecified: Secondary | ICD-10-CM

## 2024-07-30 DIAGNOSIS — K7200 Acute and subacute hepatic failure without coma: Secondary | ICD-10-CM

## 2024-07-30 DIAGNOSIS — Z711 Person with feared health complaint in whom no diagnosis is made: Secondary | ICD-10-CM

## 2024-07-30 DIAGNOSIS — Z7189 Other specified counseling: Secondary | ICD-10-CM

## 2024-07-30 DIAGNOSIS — C801 Malignant (primary) neoplasm, unspecified: Secondary | ICD-10-CM

## 2024-07-30 DIAGNOSIS — Z66 Do not resuscitate: Secondary | ICD-10-CM

## 2024-07-30 LAB — DIC (DISSEMINATED INTRAVASCULAR COAGULATION)PANEL
D-Dimer, Quant: 14.61 ug{FEU}/mL — ABNORMAL HIGH (ref 0.00–0.50)
Fibrinogen: 81 mg/dL — CL (ref 210–475)
INR: 1.8 — ABNORMAL HIGH (ref 0.8–1.2)
Platelets: 61 K/uL — ABNORMAL LOW (ref 150–400)
Prothrombin Time: 22 s — ABNORMAL HIGH (ref 11.4–15.2)
Smear Review: NONE SEEN
aPTT: 33 s (ref 24–36)

## 2024-07-30 LAB — CBC
HCT: 24.8 % — ABNORMAL LOW (ref 36.0–46.0)
Hemoglobin: 7.9 g/dL — ABNORMAL LOW (ref 12.0–15.0)
MCH: 33.3 pg (ref 26.0–34.0)
MCHC: 31.9 g/dL (ref 30.0–36.0)
MCV: 104.6 fL — ABNORMAL HIGH (ref 80.0–100.0)
Platelets: 61 K/uL — ABNORMAL LOW (ref 150–400)
RBC: 2.37 MIL/uL — ABNORMAL LOW (ref 3.87–5.11)
WBC: 18.7 K/uL — ABNORMAL HIGH (ref 4.0–10.5)
nRBC: 2.5 % — ABNORMAL HIGH (ref 0.0–0.2)

## 2024-07-30 LAB — HEPATIC FUNCTION PANEL
ALT: 158 U/L — ABNORMAL HIGH (ref 0–44)
AST: 367 U/L — ABNORMAL HIGH (ref 15–41)
Albumin: 2.7 g/dL — ABNORMAL LOW (ref 3.5–5.0)
Alkaline Phosphatase: 439 U/L — ABNORMAL HIGH (ref 38–126)
Bilirubin, Direct: 13.5 mg/dL — ABNORMAL HIGH (ref 0.0–0.2)
Indirect Bilirubin: 5.1 mg/dL — ABNORMAL HIGH (ref 0.3–0.9)
Total Bilirubin: 18.6 mg/dL (ref 0.0–1.2)
Total Protein: 5.3 g/dL — ABNORMAL LOW (ref 6.5–8.1)

## 2024-07-30 LAB — SODIUM, URINE, RANDOM: Sodium, Ur: <30 mmol/L

## 2024-07-30 LAB — TYPE AND SCREEN
ABO/RH(D): A POS
Antibody Screen: NEGATIVE

## 2024-07-30 LAB — BASIC METABOLIC PANEL WITH GFR
Anion gap: 17 — ABNORMAL HIGH (ref 5–15)
BUN: 83 mg/dL — ABNORMAL HIGH (ref 8–23)
CO2: 20 mmol/L — ABNORMAL LOW (ref 22–32)
Calcium: 9 mg/dL (ref 8.9–10.3)
Chloride: 102 mmol/L (ref 98–111)
Creatinine, Ser: 1.61 mg/dL — ABNORMAL HIGH (ref 0.44–1.00)
GFR, Estimated: 32 mL/min — ABNORMAL LOW (ref >60)
Glucose, Bld: 91 mg/dL (ref 70–99)
Potassium: 4.2 mmol/L (ref 3.5–5.1)
Sodium: 139 mmol/L (ref 135–145)

## 2024-07-30 LAB — HEMOGLOBIN A1C: Hgb A1c MFr Bld: <4.2 % — ABNORMAL LOW (ref 4.8–5.6)

## 2024-07-30 LAB — CORTISOL-AM, BLOOD: Cortisol - AM: 47.3 ug/dL — ABNORMAL HIGH (ref 6.7–22.6)

## 2024-07-30 LAB — CREATININE, URINE, RANDOM: Creatinine, Urine: 52 mg/dL

## 2024-07-30 LAB — MRSA NEXT GEN BY PCR, NASAL: MRSA by PCR Next Gen: NOT DETECTED

## 2024-07-30 LAB — GLUCOSE, CAPILLARY
Glucose-Capillary: 87 mg/dL (ref 70–99)
Glucose-Capillary: 76 mg/dL (ref 70–99)

## 2024-07-30 LAB — LACTATE DEHYDROGENASE: LDH: 1194 U/L — ABNORMAL HIGH (ref 105–235)

## 2024-07-30 MED ORDER — ONDANSETRON HCL 4 MG PO TABS
4.0000 mg | ORAL_TABLET | Freq: Four times a day (QID) | ORAL | 0 refills | Status: AC | PRN
  Filled 2024-07-30: qty 20, 5d supply, fill #0

## 2024-07-30 MED ORDER — GLYCOPYRROLATE 1 MG PO TABS: 1.0000 mg | ORAL_TABLET | ORAL | Status: DC | PRN

## 2024-07-30 MED ORDER — OXYCODONE HCL 20 MG/ML PO CONC: 5.0000 mg | ORAL | Status: DC | PRN

## 2024-07-30 MED ORDER — OXYCODONE HCL 20 MG/ML PO CONC
6.0000 mg | ORAL | 0 refills | Status: DC | PRN
  Filled 2024-07-30: qty 30, 9d supply, fill #0

## 2024-07-30 MED ORDER — PANTOPRAZOLE SODIUM 40 MG IV SOLR
40.0000 mg | Freq: Two times a day (BID) | INTRAVENOUS | Status: DC
  Administered 2024-07-30 (×2): 40 mg via INTRAVENOUS
  Filled 2024-07-30 (×2): qty 10

## 2024-07-30 MED ORDER — HALOPERIDOL LACTATE 2 MG/ML PO CONC: 0.5000 mg | ORAL | Status: DC | PRN

## 2024-07-30 MED ORDER — LORAZEPAM 2 MG/ML IJ SOLN: 0.5000 mg | INTRAMUSCULAR | Status: DC | PRN

## 2024-07-30 MED ORDER — FENTANYL CITRATE (PF) 50 MCG/ML IJ SOSY
12.5000 ug | PREFILLED_SYRINGE | INTRAMUSCULAR | Status: DC | PRN
  Administered 2024-07-30: 21:00:00 50 ug via INTRAVENOUS
  Filled 2024-07-30: qty 1

## 2024-07-30 MED ORDER — SENNA 8.6 MG PO TABS
1.0000 | ORAL_TABLET | Freq: Every day | ORAL | 0 refills | Status: AC | PRN
  Filled 2024-07-30: qty 120, 120d supply, fill #0

## 2024-07-30 MED ORDER — GLYCOPYRROLATE 0.2 MG/ML IJ SOLN: 0.2000 mg | INTRAMUSCULAR | Status: DC | PRN

## 2024-07-30 MED ORDER — LACTATED RINGERS IV SOLN: INTRAVENOUS | Status: DC

## 2024-07-30 MED ORDER — GLYCOPYRROLATE 2 MG PO TABS
1.0000 mg | ORAL_TABLET | ORAL | 0 refills | Status: AC | PRN
  Filled 2024-07-30: qty 5, 2d supply, fill #0

## 2024-07-30 MED ORDER — LORAZEPAM 2 MG/ML PO CONC
0.5000 mg | ORAL | 0 refills | Status: AC | PRN
  Filled 2024-07-30: qty 30, 17d supply, fill #0

## 2024-07-30 MED ORDER — BIOTENE DRY MOUTH MT LIQD: 15.0000 mL | OROMUCOSAL | Status: DC | PRN

## 2024-07-30 MED ORDER — LORAZEPAM 0.5 MG PO TABS: 0.5000 mg | ORAL_TABLET | ORAL | Status: DC | PRN

## 2024-07-30 MED ORDER — HALOPERIDOL LACTATE 5 MG/ML IJ SOLN: 0.5000 mg | INTRAMUSCULAR | Status: DC | PRN

## 2024-07-30 MED ORDER — HALOPERIDOL 1 MG PO TABS: 0.5000 mg | ORAL_TABLET | ORAL | Status: DC | PRN

## 2024-07-30 MED ORDER — OXYCODONE HCL 5 MG/5ML PO SOLN
5.0000 mg | ORAL | 0 refills | Status: AC | PRN
  Filled 2024-07-30: qty 100, 4d supply, fill #0

## 2024-07-30 MED ORDER — POLYVINYL ALCOHOL 1.4 % OP SOLN: 1.0000 [drp] | Freq: Four times a day (QID) | OPHTHALMIC | Status: DC | PRN

## 2024-07-30 MED ORDER — LORAZEPAM 2 MG/ML PO CONC: 0.5000 mg | ORAL | Status: DC | PRN

## 2024-07-30 MED ORDER — OXYCODONE HCL 20 MG/ML PO CONC
5.0000 mg | ORAL | Status: DC | PRN
  Administered 2024-07-30: 14:00:00 5 mg via SUBLINGUAL
  Filled 2024-07-30: qty 0.5

## 2024-07-30 MED ADMIN — Nystatin Susp 100000 Unit/ML: 500000 [IU] | ORAL | @ 09:00:00 | NDC 00904727670

## 2024-07-30 MED FILL — Nystatin Susp 100000 Unit/ML: 5.0000 mL | OROMUCOSAL | Qty: 5 | Status: AC

## 2024-07-30 NOTE — Consult Note (Signed)
 Consultation Note Date: 07/30/2024   Patient Name: Rachel Mcbride  DOB: August 23, 1945  MRN: [id number]  Age / Sex: 78 y.o., female   PCP: Loring Tanda Mae, MD Referring Physician: Madelyne Owen LABOR, MD  Reason for Consultation: Establishing goals of care     Chief Complaint/History of Present Illness:   Patient is a 78 year old female with a past medical history of COPD, hypothyroidism, CHF, and remote breast cancer who was admitted on 07/29/2024 for management of anorexia, generalized weakness, bilateral lower extremity swelling, and abnormal outpatient CT.  Since hospitalization, patient has had imaging obtained which is showing pancreatic mass and liver masses and concerns for malignant biliary obstruction.  GI consulted for recommendations and IR consulted for biopsy.  Palliative medicine team consulted to assist with complex medical decision making.  Extensive review of EMR including documentation from hospitalist.  Reviewed recent CMP noting patient's BUN elevated to 83 and creatinine of 1.641 so estimated GFR of 32.  Also noted that patient's albumin was low at 2.7.  Patient noted to have elevated LFTs including total bilirubin of 18.6.  Patient's ammonia also noted to be elevated at 40.  Patient CBC noting WBC is elevated to 18.7 and platelets low at 61.  Personally reviewed outside CT AP noting a mass at the pancreatic body/tail consistent with primary pancreatic adenocarcinoma; numerous hypodense liver masses consistent with metastatic disease; intrahepatic and extrahepatic biliary ductal dilatation.  Discussed care with bedside RN today who noted plan for biopsy with IR.  ------------------------------------------------------------------------------------------------------------- Advance Care Planning Conversation  Pertinent diagnosis: Likely new diagnosis of metastatic pancreatic cancer, acute liver failure, AKI, debility, thrombocytopenia, leukocytosis, weight loss, cancer  associated cachexia  The patient and family consented to a voluntary Advance Care Planning Conversation in person. Individuals present for the conversation: This provider discussed care at patient's bedside while husband was present.  Summary of the conversation:  Presented to bedside to see patient when patient's husband present.  Introduced myself as a member of the palliative medicine team and my role in patient's medical journey.  Patient laying in bed slightly interactive during conversation though appears acutely ill.  Patient's husband noted that he wanted to be told directly what was going on with the patient.  With his permission, able to share concerns that patient has metastatic pancreatic cancer based on imaging and her liver numbers are failing so she is reaching the end of her life.  Patient and husband agreed with this with husband noting that they have been discussing for the past week the belief that she is dying.  Husband noted that he had even reached out to [facility] of [place] for hospice support prior to coming to the hospital because he believed patient was at end-of-life.  Acknowledges this and answered questions accordingly.  Patient and husband noting that patient does not want any workup of her likely cancer and patient wants to go home with hospice support to be able to die comfortably at home.  Want to focus on her comfort at this time.  Discussed support hospice would and would not provide generally.  Patient and husband agreeing with focusing only on patient's symptom management at end-of-life.  Noted would reach out to [facility] [place] liaison to coordinate patient going home with hospice support as soon as possible.  Husband would greatly appreciate if patient could go home even today.  Acknowledged this.  All questions answered at that time.  Noted palliative medicine team will continue to follow along with patient's medical journey.  Outcome of the conversations and/or  documents completed:  Transition to comfort focused care at this time.  Working to get patient home with hospice support today if possible.  I spent 35 minutes providing separately identifiable ACP services with the patient and/or surrogate decision maker in a voluntary, in-person conversation discussing the patient's wishes and goals as detailed in the above note.  Tinnie Radar, DO Palliative Medicine Provider  -------------------------------------------------------------------------------------------------------------  Discussed care with hospitalist, GI, IR, TOC, [facility] liaison, and RN to coordinate transition to comfort focused care and working to get patient home with hospice support today.  Primary Diagnoses  Present on Admission:  Pancreatic mass  COPD (chronic obstructive pulmonary disease) (HCC)  Chronic heart failure with mildly reduced ejection fraction (HFmrEF) (HCC)  Malignant biliary obstruction (HCC)  Hypoglycemia  Thrombocytopenia  Hypothermia  Cholangitis (HCC)  AKI (acute kidney injury)   Past Medical History:  Diagnosis Date   Anemia    Atrial flutter (HCC) 2011   occurred with CHF; no current dysrhythmia   Breast cancer (HCC) 04/2010   Cataract    bilateral   CHF (congestive heart failure) (HCC) 2011   COPD (chronic obstructive pulmonary disease) (HCC)    denies SOB with daily activities   Cough 08/31/2011   Cough, persistent 09/21/2015   Dysuria 11/22/2014   Hypertension    under control; has been on med. x 2 yrs.   Lung nodule seen on imaging study 09/21/2015   Neuropathy of leg    since chemo; right leg   Osteopenia 12/31/2011   Personal history of chemotherapy    Personal history of radiation therapy    Pneumonia 2011   Scoliosis    since chemo   Weight loss 09/21/2015   Social History   Socioeconomic History   Marital status: Married    Spouse name: Not on file   Number of children: 1   Years of education: Not on file   Highest  education level: Not on file  Occupational History   Occupation: retired  Tobacco Use   Smoking status: Former    Current packs/day: 0.00    Average packs/day: 2.0 packs/day for 50.0 years (100.0 ttl pk-yrs)    Types: Cigarettes    Start date: 07/14/1960    Quit date: 07/14/2010    Years since quitting: 14.0   Smokeless tobacco: Never   Tobacco comments:    uses electronic cigarette 18 grams nicotine, no menthol  Vaping Use   Vaping status: Every Day   Start date: 07/14/2010  Substance and Sexual Activity   Alcohol  use: Never    Alcohol /week: 12.0 standard drinks of alcohol     Types: 12 Cans of beer per week    Comment: 2-3 x/week   Drug use: Never    Types: Methylphenidate   Sexual activity: Not on file  Other Topics Concern   Not on file  Social History Narrative   Not on file   Social Drivers of Health   Tobacco Use: Medium Risk (07/29/2024)   Patient History    Smoking Tobacco Use: Former    Smokeless Tobacco Use: Never    Passive Exposure: Not on Actuary Strain: Not on file  Food Insecurity: No Food Insecurity (07/29/2024)   Epic    Worried About Radiation Protection Practitioner of Food in the Last Year: Never true    Ran Out of Food in the Last Year: Never true  Transportation Needs: No Transportation Needs (07/29/2024)   Epic  Lack of Transportation (Medical): No    Lack of Transportation (Non-Medical): No  Physical Activity: Not on file  Stress: Not on file  Social Connections: Moderately Isolated (07/29/2024)   Social Connection and Isolation Panel    Frequency of Communication with Friends and Family: More than three times a week    Frequency of Social Gatherings with Friends and Family: More than three times a week    Attends Religious Services: Never    Database Administrator or Organizations: No    Attends Banker Meetings: Never    Marital Status: Married  Depression (PHQ2-9): Not on file  Alcohol  Screen: Not on file  Housing: Low Risk  (07/29/2024)   Epic    Unable to Pay for Housing in the Last Year: No    Number of Times Moved in the Last Year: 0    Homeless in the Last Year: No  Utilities: Not At Risk (07/29/2024)   Epic    Threatened with loss of utilities: No  Health Literacy: Not on file   Family History  Problem Relation Age of Onset   Heart attack Mother    Stroke Mother    Heart disease Sister    Cancer Sister        ? mouth   Breast cancer Neg Hx    Colon cancer Neg Hx    Stomach cancer Neg Hx    Esophageal cancer Neg Hx    Scheduled Meds:  Chlorhexidine  Gluconate Cloth  6 each Topical Daily   levothyroxine   75 mcg Oral Q0600   nystatin   5 mL Oral QID   pantoprazole  (PROTONIX ) IV  40 mg Intravenous Q12H   sodium chloride  flush  3 mL Intravenous Q12H   umeclidinium-vilanterol  1 puff Inhalation Daily   Continuous Infusions:  ceFEPime  (MAXIPIME ) IV     lactated ringers  100 mL/hr at 07/30/24 0916   metronidazole  Stopped (07/30/24 0738)   PRN Meds:.fentaNYL  (SUBLIMAZE ) injection, ondansetron  **OR** ondansetron  (ZOFRAN ) IV, senna Allergies[1] CBC:    Component Value Date/Time   WBC 18.7 (H) 07/30/2024 0023   HGB 7.9 (L) 07/30/2024 0023   HGB 11.3 (L) 04/05/2016 0914   HCT 24.8 (L) 07/30/2024 0023   HCT 35.4 04/05/2016 0914   PLT 61 (L) 07/30/2024 0023   PLT 205 04/05/2016 0914   MCV 104.6 (H) 07/30/2024 0023   MCV 91.7 04/05/2016 0914   NEUTROABS 18.6 (H) 07/29/2024 1447   NEUTROABS 2.7 04/05/2016 0914   LYMPHSABS 0.4 (L) 07/29/2024 1447   LYMPHSABS 0.6 (L) 04/05/2016 0914   MONOABS 1.1 (H) 07/29/2024 1447   MONOABS 0.4 04/05/2016 0914   EOSABS 0.0 07/29/2024 1447   EOSABS 0.2 04/05/2016 0914   BASOSABS 0.0 07/29/2024 1447   BASOSABS 0.1 04/05/2016 0914   Comprehensive Metabolic Panel:    Component Value Date/Time   NA 139 07/30/2024 0022   NA 141 11/22/2014 1005   K 4.2 07/30/2024 0022   K 4.3 11/22/2014 1005   CL 102 07/30/2024 0022   CL 104 12/24/2012 0912   CO2 20 (L)  07/30/2024 0022   CO2 28 11/22/2014 1005   BUN 83 (H) 07/30/2024 0022   BUN 22.7 11/22/2014 1005   CREATININE 1.61 (H) 07/30/2024 0022   CREATININE 0.9 11/22/2014 1005   GLUCOSE 91 07/30/2024 0022   GLUCOSE 74 11/22/2014 1005   GLUCOSE 87 12/24/2012 0912   CALCIUM 9.0 07/30/2024 0022   CALCIUM 10.4 11/22/2014 1005   AST 367 (H) 07/30/2024 0022  AST 19 11/22/2014 1005   ALT 158 (H) 07/30/2024 0022   ALT 8 11/22/2014 1005   ALKPHOS 439 (H) 07/30/2024 0022   ALKPHOS 47 11/22/2014 1005   BILITOT 18.6 (HH) 07/30/2024 0022   BILITOT 0.48 11/22/2014 1005   PROT 5.3 (L) 07/30/2024 0022   PROT 7.4 11/22/2014 1005   ALBUMIN 2.7 (L) 07/30/2024 0022   ALBUMIN 3.9 11/22/2014 1005    Physical Exam: Vital Signs: BP (!) 122/40 (BP Location: Left Arm)   Pulse 85   Temp 98.4 F (36.9 C) (Core)   Resp (!) 27   Ht 5' (1.524 m)   Wt 37 kg   SpO2 100%   BMI 15.93 kg/m  SpO2: SpO2: 100 % O2 Device: O2 Device: Room Air O2 Flow Rate:   Intake/output summary:  Intake/Output Summary (Last 24 hours) at 07/30/2024 9070 Last data filed at 07/30/2024 0800 Gross per 24 hour  Intake 227.16 ml  Output 450 ml  Net -222.84 ml   LBM: Last BM Date :  (PTA) Baseline Weight: Weight: 39.5 kg Most recent weight: Weight: 37 kg  General: Ill-appearing, awake, jaundiced, frail, cachectic Cardiovascular: RRR Respiratory: no increased work of breathing noted, not in respiratory distress Abdomen: distended Neuro: Awake, fatigued         Palliative Performance Scale: 20%              Additional Data Reviewed: Recent Labs    07/29/24 1447 07/30/24 0022 07/30/24 0023  WBC 20.5*  --  18.7*  HGB 10.3*  --  7.9*  PLT 68* 61* 61*  NA 138 139  --   BUN 84* 83*  --   CREATININE 1.76* 1.61*  --     Imaging: CT OUTSIDE FILMS BODY/ABD/PELVIS ADDENDUM: OVERREAD OF AN OUTSIDE CT ABDOMEN AND PELVIS SUBMITTED FROM [facility] DATED 07/29/2024. IMAGES ARE AVAILABLE BUT NO REPORT IS  SUBMITTED. EXAM: CT ABDOMEN AND PELVIS WITH CONTRAST 07/29/2024  TECHNIQUE: CT of the abdomen and pelvis was performed with the administration of intravenous contrast. Multiplanar reformatted images are provided for review. Automated exposure control, iterative reconstruction, and/or weight-based adjustment of the mA/kV was utilized to reduce the radiation dose to as low as reasonably achievable.  COMPARISON: None available.  CLINICAL HISTORY:  FINDINGS:  LOWER CHEST: Emphysematous changes and scarring in the lungs. Previous right mastectomy with scarring in the right anterior chest, likely post-treatment.  LIVER: Numerous hypodense masses fill the liver consistent with diffuse metastatic disease.  GALLBLADDER AND BILE DUCTS: Gallbladder is unremarkable. Intra and extrahepatic bile duct dilatation.  SPLEEN: Calcified granulomas in the spleen.  PANCREAS: Hypodense mass centered at the body/tail of the pancreas measuring 4.1 x 4.1 cm in diameter. There is proximal pancreatic ductal dilatation and atrophy. This is likely to represent adenocarcinoma, primary of the pancreas. Fat planes between the adjacent stomach and duodenum are obscured, suggesting probable local extension.  ADRENAL GLANDS: No acute abnormality.  KIDNEYS, URETERS AND BLADDER: Right pelvic kidney. Nephrograms are symmetrical and there is no hydronephrosis. No stones in the kidneys or ureters. No perinephric or periureteral stranding. The bladder is decompressed.  GI AND BOWEL: Stomach demonstrates no acute abnormality. The stomach, small bowel, and colon are not abnormally distended. No evidence of obstruction.  PERITONEUM AND RETROPERITONEUM: No ascites. No free air.  VASCULATURE: Aorta is normal in caliber.  LYMPH NODES: No definite lymphadenopathy.  REPRODUCTIVE ORGANS: No abnormal adnexal masses.  BONES AND SOFT TISSUES: No focal bone lesions. Degenerative changes throughout the  lumbar spine with lumbar scoliosis convex towards the left. No focal soft tissue abnormality.  IMPRESSION: 1. Hypodense mass centered at the pancreatic body/tail measuring 4.1 x 4.1 cm with proximal pancreatic ductal dilatation and atrophy, most consistent with primary pancreatic adenocarcinoma with probable local extension. 2. Numerous hypodense liver masses consistent with diffuse metastatic disease. 3. Intrahepatic and extrahepatic biliary ductal dilatation.  Electronically signed by: Elsie Gravely MD 07/29/2024 05:55 PM EST RP Workstation: [id number]    I personally reviewed recent imaging.   Palliative Care Assessment and Plan Summary of Established Goals of Care and Medical Treatment Preferences   Patient is a 78 year old female with a past medical history of COPD, hypothyroidism, CHF, and remote breast cancer who was admitted on 07/29/2024 for management of anorexia, generalized weakness, bilateral lower extremity swelling, and abnormal outpatient CT.  Since hospitalization, patient has had imaging obtained which is showing pancreatic mass and liver masses and concerns for malignant biliary obstruction.  GI consulted for recommendations and IR consulted for biopsy.  Palliative medicine team consulted to assist with complex medical decision making.  # Complex medical decision making/goals of care  # Complex medical decision making/goals of care  - Discussed care with husband at patient's bedside.  Patient was able to participate some in conversation during visit.  Husband notes he and patient have been discussing and they do not want any aggressive medical interventions and want to focus on patient's comfort at this time.  They want to get her home with hospice as soon as possible realizing she is at the end of her life.  TOC and hospice of the [facility] liaison assisting with coordination of care and discharging home with hospice hopefully today.  Palliative medicine team will continue  to follow along with patient's medical journey.  -At this time we will discontinue interventions that are no longer focused on comfort such as IV fluids, imaging, or lab work.  Will instead focus on symptom management of pain, dyspnea, and agitation in the setting of end-of-life care.    Code Status: Do not attempt resuscitation (DNR) - Comfort care  # Symptom management Patient is receiving these palliative interventions for symptom management with an intent to improve quality of life.     -Pain/Dyspnea, acute in the setting of end-of-life care                               -Start oral oxycodone  5 mg every 2 hours as needed   - Change IV fentanyl  to 12.5-50 mcg every hour as needed                  -Anxiety/agitation, in the setting of end-of-life care                               -Start as needed Ativan  including IV Ativan  0.5 mg every 4 hours as needed                               -Start as needed Haldol                   -Secretions, in the setting of end-of-life care                               -  Start IV glycopyrrolate  0.2 mg every 4 hours as needed.  # Discharge Planning:  Home with Hospice  Thank you for allowing the palliative care team to participate in the care [facility].  Tinnie Radar, DO Palliative Care Provider PMT # [phone]  If patient remains symptomatic despite maximum doses, please call PMT at [phone] between 0700 and 1900. Outside of these hours, please call attending, as PMT does not have night coverage.  Billing based on MDM: High  Problems Addressed: One acute or chronic illness or injury that poses a threat to life or bodily function  Amount and/or Complexity of Data: Category 1:Review of prior external note(s) from each unique source, Review of the result(s) of each unique test, and Assessment requiring an independent historian(s), Category 2:Independent interpretation of a test performed by another physician/other qualified health care  professional (not separately reported), and Category 3:Discussion of management or test interpretation with external physician/other qualified health care professional/appropriate source (not separately reported)  Risks: Parenteral controlled substances     [1]  Allergies Allergen Reactions   Shellfish Allergy Itching   Pneumococcal Vaccines Itching

## 2024-07-30 NOTE — Discharge Summary (Signed)
 Physician Discharge Summary   Patient: Rachel Mcbride MRN: [id number] DOB: May 26, 1946  Admit date:     07/29/2024  Discharge date: 07/30/2024  Discharge Physician: Owen DELENA Lore   PCP: Loring Tanda Mae, MD   Recommendations at discharge:   Discharge home with Hospice care.   Discharge Diagnoses: Principal Problem:   Pancreatic mass Active Problems:   COPD (chronic obstructive pulmonary disease) (HCC)   Chronic heart failure with mildly reduced ejection fraction (HFmrEF) (HCC)   Malignant biliary obstruction (HCC)   Hypoglycemia   Thrombocytopenia   Hypothermia   Cholangitis (HCC)   AKI (acute kidney injury)  Resolved Problems:   * No resolved hospital problems. *  Hospital Course: 78 year old with past medical history significant for COPD, hypothyroidism, chronic heart failure moderate reduced ejection fraction, remote breast cancer presented with anorexia, generalized weakness, bilateral lower extremity swelling abnormal CT abdomen and pelvis which showed pancreatic mass with pancreatic ductal dilation liver masses intra and extrahepatic biliary dilation most concerning for pancreatic cancer with metastasis, found to be hypothermic, tachycardic, transaminases, increased creatinine 1.7, bilirubin 22.   Patient admitted with concern of cholangitis and sepsis    Assessment and Plan: 1-Pancreatic mass, liver masses malignant biliary obstruction, Questionable cholangitis - Patient with anorexia, abdominal pain, jaundice, CT showed pancreatic mass with liver metastasis and biliary obstruction -Patient is very cachectic, severe protein caloric malnutrition appears very weak and frail, palliative care was consulted for goals of care.  Family opted for full comfort care.  Discharge home today when equipment is available.   Sepsis, cholangitis - Treated with fluids and cefepime   -Full comfort care  Hypoglycemia - Treated with fluids and amp of D-50 -comfort  feeding.    Hypothermia -resolved, in setting hypoglycemia.    AKI -treated with fluids. Comfort care    COPD -Continue inhaler.    Chronic heart failure moderate reduced ejection fraction -hold diuretics.    Toe discoloration -comfort care            Consultants: GI, Palliative care Procedures performed: none Disposition: Home Diet recommendation:  Regular diet DISCHARGE MEDICATION: Allergies as of 07/30/2024       Reactions   Shellfish Allergy Itching   Pneumococcal Vaccines Itching        Medication List     STOP taking these medications    CALCIUM PO   IRON PO       TAKE these medications    Anoro Ellipta  62.5-25 MCG/ACT Aepb Generic drug: umeclidinium-vilanterol INHALE 1 PUFF BY MOUTH EVERY DAY   glycopyrrolate  2 MG tablet Commonly known as: ROBINUL  Take 0.5 tablets (1 mg total) by mouth every 4 (four) hours as needed (excessive secretions).   levothyroxine  75 MCG tablet Commonly known as: SYNTHROID  Take 75 mcg by mouth daily before breakfast.   LORazepam  2 MG/ML concentrated solution Commonly known as: ATIVAN  Place 0.3 mLs (0.6 mg total) under the tongue every 4 (four) hours as needed for anxiety.   ondansetron  4 MG tablet Commonly known as: ZOFRAN  Take 1 tablet (4 mg total) by mouth every 6 (six) hours as needed for nausea.   oxyCODONE  5 MG/5ML solution Commonly known as: ROXICODONE  Take 5 mLs (5 mg total) by mouth every 4 (four) hours as needed for severe pain (pain score 7-10).   senna 8.6 MG Tabs tablet Commonly known as: SENOKOT Take 1 tablet (8.6 mg total) by mouth daily as needed for mild constipation or moderate constipation.        Discharge Exam:  Filed Weights   07/29/24 1248 07/30/24 0500  Weight: 39.5 kg 37 kg   General; Cachetic, icteric.   Condition at discharge: poor  The results of significant diagnostics from this hospitalization (including imaging, microbiology, ancillary and laboratory) are listed  below for reference.   Imaging Studies: CT OUTSIDE FILMS BODY/ABD/PELVIS Result Date: 07/29/2024 ADDENDUM: OVERREAD OF AN OUTSIDE CT ABDOMEN AND PELVIS SUBMITTED FROM [facility] DATED 07/29/2024. IMAGES ARE AVAILABLE BUT NO REPORT IS SUBMITTED. EXAM: CT ABDOMEN AND PELVIS WITH CONTRAST 07/29/2024 TECHNIQUE: CT of the abdomen and pelvis was performed with the administration of intravenous contrast. Multiplanar reformatted images are provided for review. Automated exposure control, iterative reconstruction, and/or weight-based adjustment of the mA/kV was utilized to reduce the radiation dose to as low as reasonably achievable. COMPARISON: None available. CLINICAL HISTORY: FINDINGS: LOWER CHEST: Emphysematous changes and scarring in the lungs. Previous right mastectomy with scarring in the right anterior chest, likely post-treatment. LIVER: Numerous hypodense masses fill the liver consistent with diffuse metastatic disease. GALLBLADDER AND BILE DUCTS: Gallbladder is unremarkable. Intra and extrahepatic bile duct dilatation. SPLEEN: Calcified granulomas in the spleen. PANCREAS: Hypodense mass centered at the body/tail of the pancreas measuring 4.1 x 4.1 cm in diameter. There is proximal pancreatic ductal dilatation and atrophy. This is likely to represent adenocarcinoma, primary of the pancreas. Fat planes between the adjacent stomach and duodenum are obscured, suggesting probable local extension. ADRENAL GLANDS: No acute abnormality. KIDNEYS, URETERS AND BLADDER: Right pelvic kidney. Nephrograms are symmetrical and there is no hydronephrosis. No stones in the kidneys or ureters. No perinephric or periureteral stranding. The bladder is decompressed. GI AND BOWEL: Stomach demonstrates no acute abnormality. The stomach, small bowel, and colon are not abnormally distended. No evidence of obstruction. PERITONEUM AND RETROPERITONEUM: No ascites. No free air. VASCULATURE: Aorta is normal in caliber. LYMPH  NODES: No definite lymphadenopathy. REPRODUCTIVE ORGANS: No abnormal adnexal masses. BONES AND SOFT TISSUES: No focal bone lesions. Degenerative changes throughout the lumbar spine with lumbar scoliosis convex towards the left. No focal soft tissue abnormality. IMPRESSION: 1. Hypodense mass centered at the pancreatic body/tail measuring 4.1 x 4.1 cm with proximal pancreatic ductal dilatation and atrophy, most consistent with primary pancreatic adenocarcinoma with probable local extension. 2. Numerous hypodense liver masses consistent with diffuse metastatic disease. 3. Intrahepatic and extrahepatic biliary ductal dilatation. Electronically signed by: Elsie Gravely MD 07/29/2024 05:55 PM EST RP Workstation: [id number]    Microbiology: Results for orders placed or performed during the hospital encounter of 07/29/24  MRSA Next Gen by PCR, Nasal     Status: None   Collection Time: 07/29/24 11:25 PM   Specimen: Nasal Mucosa; Nasal Swab  Result Value Ref Range Status   MRSA by PCR Next Gen NOT DETECTED NOT DETECTED Final    Comment: (NOTE) The GeneXpert MRSA Assay (FDA approved for NASAL specimens only), is one component of a comprehensive MRSA colonization surveillance program. It is not intended to diagnose MRSA infection nor to guide or monitor treatment for MRSA infections. Test performance is not FDA approved in patients less than 61 years old. Performed at [facility], [address], [place], [place] [address]     Labs: CBC: Recent Labs  Lab 07/29/24 1447 07/30/24 0022 07/30/24 0023  WBC 20.5*  --  18.7*  NEUTROABS 18.6*  --   --   HGB 10.3*  --  7.9*  HCT 31.8*  --  24.8*  MCV 105.6*  --  104.6*  PLT 68* 61* 61*  Basic Metabolic Panel: Recent Labs  Lab 07/29/24 1447 07/30/24 0022  NA 138 139  K 5.1 4.2  CL 98 102  CO2 17* 20*  GLUCOSE 89 91  BUN 84* 83*  CREATININE 1.76* 1.61*  CALCIUM 9.6 9.0   Liver Function Tests: Recent Labs  Lab  07/29/24 1447 07/30/24 0022  AST 461* 367*  ALT 188* 158*  ALKPHOS 566* 439*  BILITOT 22.4* 18.6*  PROT 6.2* 5.3*  ALBUMIN 3.2* 2.7*   CBG: Recent Labs  Lab 07/29/24 1926 07/29/24 2042 07/29/24 2347 07/30/24 0324 07/30/24 0812  GLUCAP 102* 265* 108* 87 76    Discharge time spent: greater than 30 minutes.  Signed: Owen DELENA Lore, MD [facility] 07/30/2024

## 2024-07-30 NOTE — TOC Transition Note (Addendum)
 Transition of Care [facility]) - Discharge Note   Patient Details  Name: Rachel Mcbride MRN: [id number] Date of Birth: 1946/03/11  Transition of Care [facility]) CM/SW Contact:  Jon ONEIDA Anon, RN Phone Number: 07/30/2024, 12:15 PM   Clinical Narrative:    Pt will discharge home with hospice care services through [facility]. Equipment being delivered to the home. [facility] will transport once all equipment delivered to the home. No further ICM needs at this time.   Addendum:  [facility] called at 1441. DC packet placed at RN station with signed DNR inside.      Barriers to Discharge: Equipment Delay (Awaiting equipment to be delivered to the home)   Patient Goals and CMS Choice Patient states their goals for this hospitalization and ongoing recovery are:: To return home and recieve home hospice services [email or web address].gov Compare Post Acute Care list provided to:: Patient Represenative (must comment) (Pt spouse) Choice offered to / list presented to : Spouse [place] ownership interest in [facility].provided to:: Spouse    Discharge Placement                       Discharge Plan and Services Additional resources added to the After Visit Summary for   In-house Referral: Hospice / Palliative Care Discharge Planning Services: CM Consult Post Acute Care Choice: Durable Medical Equipment, Hospice          DME Arranged:  (HOP arranging DME) DME Agency: Other - Comment ([facility])       HH Arranged: NA HH Agency: [facility] spoke with at [facility] Agency: Magdalena Berber  Social Drivers of Health (SDOH) Interventions SDOH Screenings   Food Insecurity: No Food Insecurity (07/29/2024)  Housing: Low Risk (07/29/2024)  Transportation Needs: No Transportation Needs (07/29/2024)  Utilities: Not At Risk (07/29/2024)  Social Connections: Moderately Isolated (07/29/2024)  Tobacco Use: Medium  Risk (07/29/2024)     Readmission Risk Interventions    07/30/2024   11:43 AM  Readmission Risk Prevention Plan  Transportation Screening Complete  PCP or Specialist Appt within 5-7 Days Complete  Home Care Screening Complete  Medication Review (RN CM) Complete

## 2024-07-30 NOTE — Progress Notes (Signed)
 Patient was given Fentanyl  50 mg for pain and comfort.  IV access were removed. Secured medications and personal belongings with cards, discs were given to [facility] during transport. Patient's husband was called and made aware that she is on her way home.

## 2024-07-30 NOTE — Progress Notes (Signed)
 [facility] pt was referred to go home with hospice I have reviewed the chart and discussed with husband at bedside. Pt is in agreement with hospice care. He is requesting a hospital bed. I have ordered this for delivery STAT. The husband will call me when it is delivered and then we can arrange ambulance for pt to d/c home today.   Magdalena Berber RN [phone]

## 2024-07-30 NOTE — Progress Notes (Signed)
 Discharge medications delivered to patient at the bedside.

## 2024-07-30 NOTE — Progress Notes (Incomplete)
 PROGRESS NOTE    Rachel Mcbride  FMW:[id number] DOB: 1946-03-15 DOA: 07/29/2024 PCP: Loring Tanda Mae, MD   Brief Narrative: 78 year old with past medical history significant for COPD, hypothyroidism, chronic heart failure moderate reduced ejection fraction, remote breast cancer presented with anorexia, generalized weakness, bilateral lower extremity swelling abnormal CT abdomen and pelvis which showed pancreatic mass with pancreatic ductal dilation liver masses intra and extrahepatic biliary dilation most concerning for pancreatic cancer with metastasis, found to be hypothermic, tachycardic, transaminases, increased creatinine 1.7, bilirubin 22.  Patient admitted with concern of cholangitis and sepsis   Assessment & Plan:   Principal Problem:   Pancreatic mass Active Problems:   COPD (chronic obstructive pulmonary disease) (HCC)   Chronic heart failure with mildly reduced ejection fraction (HFmrEF) (HCC)   Malignant biliary obstruction (HCC)   Hypoglycemia   Thrombocytopenia   Hypothermia   Cholangitis (HCC)   AKI (acute kidney injury)  1-Pancreatic mass, liver masses malignant biliary obstruction, Questionable cholangitis -  Sepsis, cholangitis -  Hypoglycemia -  Hypothermia -  AKI -  COPD -  Chronic heart failure moderate reduced ejection fraction -  Toe discoloration -    Wound 07/30/24 0011 Pressure Injury Coccyx Medial;Lower Stage 3 -  Full thickness tissue loss. Subcutaneous fat may be visible but bone, tendon or muscle are NOT exposed. (Active)                  Estimated body mass index is 15.93 kg/m as calculated from the following:   Height as of this encounter: 5' (1.524 m).   Weight as of this encounter: 37 kg.   DVT prophylaxis:  Code Status:  Family Communication: Disposition Plan:  Status is: Inpatient {Inpatient:23812}    Consultants:  ***  Procedures:  ***  Antimicrobials:     Subjective: ***  Objective: Vitals:   07/30/24 0400 07/30/24 0500 07/30/24 0600 07/30/24 0700  BP: (!) 112/39 (!) 124/37 (!) 126/39 (!) 121/39  Pulse: 81 84 92 85  Resp: 14 12 (!) 21 13  Temp: 98.4 F (36.9 C) 98.4 F (36.9 C) 98.4 F (36.9 C) 98.6 F (37 C)  TempSrc:      SpO2: 100% 100% 98% 100%  Weight:  37 kg    Height:        Intake/Output Summary (Last 24 hours) at 07/30/2024 0741 Last data filed at 07/30/2024 9277 Gross per 24 hour  Intake 172.33 ml  Output 450 ml  Net -277.67 ml   Filed Weights   07/29/24 1248 07/30/24 0500  Weight: 39.5 kg 37 kg    Examination:  General exam: Appears calm and comfortable  Respiratory system: Clear to auscultation. Respiratory effort normal. Cardiovascular system: S1 & S2 heard, RRR. No JVD, murmurs, rubs, gallops or clicks. No pedal edema. Gastrointestinal system: Abdomen is nondistended, soft and nontender. No organomegaly or masses felt. Normal bowel sounds heard. Central nervous system: Alert and oriented. No focal neurological deficits. Extremities: Symmetric 5 x 5 power. Skin: No rashes, lesions or ulcers Psychiatry: Judgement and insight appear normal. Mood & affect appropriate.     Data Reviewed: I have personally reviewed following labs and imaging studies  CBC: Recent Labs  Lab 07/29/24 1447 07/30/24 0022 07/30/24 0023  WBC 20.5*  --  18.7*  NEUTROABS 18.6*  --   --   HGB 10.3*  --  7.9*  HCT 31.8*  --  24.8*  MCV 105.6*  --  104.6*  PLT 68* 61* 61*   Basic Metabolic  Panel: Recent Labs  Lab 07/29/24 1447 07/30/24 0022  NA 138 139  K 5.1 4.2  CL 98 102  CO2 17* 20*  GLUCOSE 89 91  BUN 84* 83*  CREATININE 1.76* 1.61*  CALCIUM 9.6 9.0   GFR: Estimated Creatinine Clearance: 16.5 mL/min (A) (by C-G formula based on SCr of 1.61 mg/dL (H)). Liver Function Tests: Recent Labs  Lab 07/29/24 1447 07/30/24 0022  AST 461* 367*  ALT 188* 158*  ALKPHOS 566* 439*  BILITOT 22.4* 18.6*  PROT  6.2* 5.3*  ALBUMIN 3.2* 2.7*   Recent Labs  Lab 07/29/24 1447  LIPASE 220*   Recent Labs  Lab 07/29/24 1447  AMMONIA 40*   Coagulation Profile: Recent Labs  Lab 07/30/24 0022  INR 1.8*   Cardiac Enzymes: No results for input(s): CKTOTAL, CKMB, CKMBINDEX, TROPONINI in the last 168 hours. BNP (last 3 results) Recent Labs    07/29/24 1447  PROBNP 12,482.0*   HbA1C: Recent Labs    07/30/24 0022  HGBA1C <4.2*   CBG: Recent Labs  Lab 07/29/24 1901 07/29/24 1926 07/29/24 2042 07/29/24 2347 07/30/24 0324  GLUCAP 35* 102* 265* 108* 87   Lipid Profile: No results for input(s): CHOL, HDL, LDLCALC, TRIG, CHOLHDL, LDLDIRECT in the last 72 hours. Thyroid  Function Tests: No results for input(s): TSH, T4TOTAL, FREET4, T3FREE, THYROIDAB in the last 72 hours. Anemia Panel: No results for input(s): VITAMINB12, FOLATE, FERRITIN, TIBC, IRON, RETICCTPCT in the last 72 hours. Sepsis Labs: No results for input(s): PROCALCITON, LATICACIDVEN in the last 168 hours.  Recent Results (from the past 240 hours)  MRSA Next Gen by PCR, Nasal     Status: None   Collection Time: 07/29/24 11:25 PM   Specimen: Nasal Mucosa; Nasal Swab  Result Value Ref Range Status   MRSA by PCR Next Gen NOT DETECTED NOT DETECTED Final    Comment: (NOTE) The GeneXpert MRSA Assay (FDA approved for NASAL specimens only), is one component of a comprehensive MRSA colonization surveillance program. It is not intended to diagnose MRSA infection nor to guide or monitor treatment for MRSA infections. Test performance is not FDA approved in patients less than 79 years old. Performed at [facility], [address] [place], [place] [address]          Radiology Studies: CT OUTSIDE FILMS BODY/ABD/PELVIS Result Date: 07/29/2024 This examination belongs to an outside facility and is stored here for comparison purposes only.  Contact the  originating outside institution for any associated report or interpretation.       Scheduled Meds:  Chlorhexidine  Gluconate Cloth  6 each Topical Daily   levothyroxine   75 mcg Oral Q0600   pantoprazole  (PROTONIX ) IV  40 mg Intravenous Q12H   sodium chloride  flush  3 mL Intravenous Q12H   umeclidinium-vilanterol  1 puff Inhalation Daily   Continuous Infusions:  ceFEPime  (MAXIPIME ) IV     lactated ringers  50 mL/hr at 07/30/24 0727   metronidazole  100 mL/hr at 07/30/24 0722     LOS: 1 day    Time spent: 35 Minutes    Cassie Henkels A Maisy Newport, MD [facility]   If 7PM-7AM, please contact night-coverage [email or web address]  07/30/2024, 7:41 AM

## 2024-07-30 NOTE — Telephone Encounter (Addendum)
 Pharmacy Patient Advocate Encounter  Received notification from [facility] that Prior Authorization for LORazepam  2MG /ML solution  has been APPROVED from 07/30/2024 to 07/30/2025   PA #/Case ID/Reference #: [id number] Key: [id number]

## 2024-07-31 LAB — HAPTOGLOBIN: Haptoglobin: <10 mg/dL — ABNORMAL LOW (ref 42–346)

## 2024-08-04 LAB — CULTURE, BLOOD (ROUTINE X 2)
Culture: NO GROWTH
Culture: NO GROWTH

## 2024-09-24 ENCOUNTER — Ambulatory Visit: Payer: Medicare Other | Admitting: Adult Health
